# Patient Record
Sex: Female | Born: 1955 | Race: White | Hispanic: No | Smoking: Never smoker
Health system: Southern US, Community
[De-identification: ages and names within clinical notes are randomized; demographics above are authoritative.]

## PROBLEM LIST (undated history)

## (undated) DIAGNOSIS — K219 Gastro-esophageal reflux disease without esophagitis: Secondary | ICD-10-CM

## (undated) DIAGNOSIS — F419 Anxiety disorder, unspecified: Secondary | ICD-10-CM

## (undated) DIAGNOSIS — K635 Polyp of colon: Secondary | ICD-10-CM

## (undated) DIAGNOSIS — I82409 Acute embolism and thrombosis of unspecified deep veins of unspecified lower extremity: Secondary | ICD-10-CM

## (undated) DIAGNOSIS — M1990 Unspecified osteoarthritis, unspecified site: Secondary | ICD-10-CM

## (undated) DIAGNOSIS — I10 Essential (primary) hypertension: Secondary | ICD-10-CM

## (undated) DIAGNOSIS — N63 Unspecified lump in unspecified breast: Secondary | ICD-10-CM

## (undated) DIAGNOSIS — R61 Generalized hyperhidrosis: Secondary | ICD-10-CM

## (undated) DIAGNOSIS — Z972 Presence of dental prosthetic device (complete) (partial): Secondary | ICD-10-CM

## (undated) DIAGNOSIS — D649 Anemia, unspecified: Secondary | ICD-10-CM

## (undated) DIAGNOSIS — K08109 Complete loss of teeth, unspecified cause, unspecified class: Secondary | ICD-10-CM

## (undated) HISTORY — DX: Unspecified lump in unspecified breast: N63.0

## (undated) HISTORY — DX: Essential (primary) hypertension: I10

## (undated) HISTORY — PX: SHOULDER ARTHROSCOPY W/ ROTATOR CUFF REPAIR: SHX2400

## (undated) HISTORY — DX: Gastro-esophageal reflux disease without esophagitis: K21.9

## (undated) HISTORY — DX: Presence of dental prosthetic device (complete) (partial): Z97.2

## (undated) HISTORY — DX: Generalized hyperhidrosis: R61

## (undated) HISTORY — PX: CHOLECYSTECTOMY: SHX55

## (undated) HISTORY — DX: Polyp of colon: K63.5

## (undated) HISTORY — DX: Complete loss of teeth, unspecified cause, unspecified class: K08.109

---

## 1987-11-11 HISTORY — PX: ABDOMINAL HYSTERECTOMY: SHX81

## 1999-11-11 HISTORY — PX: CARDIAC CATHETERIZATION: SHX172

## 2001-06-22 ENCOUNTER — Inpatient Hospital Stay (HOSPITAL_COMMUNITY): Admission: AD | Admit: 2001-06-22 | Discharge: 2001-06-23 | Payer: Self-pay | Admitting: Cardiology

## 2002-07-21 ENCOUNTER — Encounter: Admission: RE | Admit: 2002-07-21 | Discharge: 2002-08-09 | Payer: Self-pay

## 2002-08-11 ENCOUNTER — Encounter: Admission: RE | Admit: 2002-08-11 | Discharge: 2002-11-09 | Payer: Self-pay

## 2002-10-03 ENCOUNTER — Encounter: Payer: Self-pay | Admitting: General Surgery

## 2002-10-03 ENCOUNTER — Encounter: Admission: RE | Admit: 2002-10-03 | Discharge: 2002-10-03 | Payer: Self-pay | Admitting: General Surgery

## 2002-11-17 ENCOUNTER — Encounter: Admission: RE | Admit: 2002-11-17 | Discharge: 2003-02-15 | Payer: Self-pay

## 2003-03-03 ENCOUNTER — Encounter: Admission: RE | Admit: 2003-03-03 | Discharge: 2003-06-01 | Payer: Self-pay | Admitting: Orthopedic Surgery

## 2003-03-28 ENCOUNTER — Encounter
Admission: RE | Admit: 2003-03-28 | Discharge: 2003-06-26 | Payer: Self-pay | Admitting: Physical Medicine & Rehabilitation

## 2003-05-27 ENCOUNTER — Inpatient Hospital Stay (HOSPITAL_COMMUNITY): Admission: AD | Admit: 2003-05-27 | Discharge: 2003-05-28 | Payer: Self-pay | Admitting: Cardiology

## 2003-05-28 ENCOUNTER — Encounter: Payer: Self-pay | Admitting: Cardiology

## 2003-11-20 ENCOUNTER — Encounter: Admission: RE | Admit: 2003-11-20 | Discharge: 2003-11-20 | Payer: Self-pay | Admitting: Obstetrics and Gynecology

## 2003-11-20 ENCOUNTER — Other Ambulatory Visit: Admission: RE | Admit: 2003-11-20 | Discharge: 2003-11-20 | Payer: Self-pay | Admitting: Obstetrics and Gynecology

## 2005-05-06 ENCOUNTER — Ambulatory Visit: Payer: Self-pay | Admitting: Internal Medicine

## 2005-05-12 ENCOUNTER — Ambulatory Visit (HOSPITAL_COMMUNITY): Admission: RE | Admit: 2005-05-12 | Discharge: 2005-05-12 | Payer: Self-pay | Admitting: Internal Medicine

## 2005-05-23 ENCOUNTER — Encounter: Admission: RE | Admit: 2005-05-23 | Discharge: 2005-05-23 | Payer: Self-pay | Admitting: Obstetrics and Gynecology

## 2005-05-29 ENCOUNTER — Ambulatory Visit (HOSPITAL_COMMUNITY): Admission: RE | Admit: 2005-05-29 | Discharge: 2005-05-29 | Payer: Self-pay | Admitting: Obstetrics and Gynecology

## 2005-06-16 ENCOUNTER — Ambulatory Visit (HOSPITAL_COMMUNITY): Admission: RE | Admit: 2005-06-16 | Discharge: 2005-06-16 | Payer: Self-pay | Admitting: Internal Medicine

## 2005-06-16 ENCOUNTER — Ambulatory Visit: Payer: Self-pay | Admitting: Internal Medicine

## 2005-07-01 ENCOUNTER — Ambulatory Visit (HOSPITAL_COMMUNITY): Admission: RE | Admit: 2005-07-01 | Discharge: 2005-07-01 | Payer: Self-pay | Admitting: Obstetrics and Gynecology

## 2005-07-03 ENCOUNTER — Ambulatory Visit: Payer: Self-pay | Admitting: Internal Medicine

## 2005-07-15 ENCOUNTER — Ambulatory Visit: Payer: Self-pay | Admitting: Cardiology

## 2005-07-22 ENCOUNTER — Ambulatory Visit: Payer: Self-pay | Admitting: Internal Medicine

## 2005-08-06 ENCOUNTER — Encounter (INDEPENDENT_AMBULATORY_CARE_PROVIDER_SITE_OTHER): Payer: Self-pay | Admitting: Internal Medicine

## 2005-08-06 ENCOUNTER — Ambulatory Visit: Payer: Self-pay | Admitting: Internal Medicine

## 2005-08-06 ENCOUNTER — Ambulatory Visit (HOSPITAL_COMMUNITY): Admission: RE | Admit: 2005-08-06 | Discharge: 2005-08-06 | Payer: Self-pay | Admitting: Internal Medicine

## 2005-08-09 ENCOUNTER — Emergency Department (HOSPITAL_COMMUNITY): Admission: EM | Admit: 2005-08-09 | Discharge: 2005-08-09 | Payer: Self-pay | Admitting: Emergency Medicine

## 2005-11-17 ENCOUNTER — Ambulatory Visit (HOSPITAL_COMMUNITY): Admission: RE | Admit: 2005-11-17 | Discharge: 2005-11-17 | Payer: Self-pay | Admitting: Internal Medicine

## 2006-02-25 ENCOUNTER — Ambulatory Visit (HOSPITAL_COMMUNITY): Admission: RE | Admit: 2006-02-25 | Discharge: 2006-02-25 | Payer: Self-pay | Admitting: Internal Medicine

## 2007-12-03 ENCOUNTER — Ambulatory Visit: Payer: Self-pay | Admitting: Cardiology

## 2008-07-03 ENCOUNTER — Ambulatory Visit: Payer: Self-pay | Admitting: Cardiology

## 2008-12-13 ENCOUNTER — Encounter: Admission: RE | Admit: 2008-12-13 | Discharge: 2008-12-13 | Payer: Self-pay | Admitting: *Deleted

## 2009-06-04 ENCOUNTER — Encounter: Admission: RE | Admit: 2009-06-04 | Discharge: 2009-09-02 | Payer: Self-pay | Admitting: Surgery

## 2009-06-18 ENCOUNTER — Ambulatory Visit (HOSPITAL_COMMUNITY): Admission: RE | Admit: 2009-06-18 | Discharge: 2009-06-18 | Payer: Self-pay | Admitting: Surgery

## 2010-01-02 ENCOUNTER — Ambulatory Visit: Payer: Self-pay | Admitting: Cardiology

## 2010-07-23 ENCOUNTER — Ambulatory Visit (HOSPITAL_COMMUNITY)
Admission: RE | Admit: 2010-07-23 | Discharge: 2010-07-23 | Payer: Self-pay | Source: Home / Self Care | Admitting: Surgery

## 2010-08-19 ENCOUNTER — Inpatient Hospital Stay (HOSPITAL_COMMUNITY): Admission: RE | Admit: 2010-08-19 | Discharge: 2010-08-22 | Payer: Self-pay | Admitting: Surgery

## 2010-08-19 HISTORY — PX: GASTRIC BYPASS OPEN: SUR638

## 2010-08-20 ENCOUNTER — Encounter (INDEPENDENT_AMBULATORY_CARE_PROVIDER_SITE_OTHER): Payer: Self-pay | Admitting: Surgery

## 2010-08-20 ENCOUNTER — Ambulatory Visit: Payer: Self-pay | Admitting: Vascular Surgery

## 2010-08-27 ENCOUNTER — Encounter
Admission: RE | Admit: 2010-08-27 | Discharge: 2010-08-27 | Payer: Self-pay | Source: Home / Self Care | Attending: Surgery | Admitting: Surgery

## 2010-10-08 ENCOUNTER — Encounter
Admission: RE | Admit: 2010-10-08 | Discharge: 2010-12-10 | Payer: Self-pay | Source: Home / Self Care | Attending: Surgery | Admitting: Surgery

## 2011-01-23 LAB — DIFFERENTIAL
Basophils Relative: 1 % (ref 0–1)
Eosinophils Absolute: 0 10*3/uL (ref 0.0–0.7)
Eosinophils Absolute: 0.2 10*3/uL (ref 0.0–0.7)
Eosinophils Relative: 0 % (ref 0–5)
Eosinophils Relative: 2 % (ref 0–5)
Lymphocytes Relative: 12 % (ref 12–46)
Lymphocytes Relative: 33 % (ref 12–46)
Lymphs Abs: 1.5 10*3/uL (ref 0.7–4.0)
Lymphs Abs: 2.3 10*3/uL (ref 0.7–4.0)
Monocytes Absolute: 0.4 10*3/uL (ref 0.1–1.0)
Monocytes Relative: 5 % (ref 3–12)
Neutro Abs: 6.6 10*3/uL (ref 1.7–7.7)
Neutro Abs: 4.1 10*3/uL (ref 1.7–7.7)
Neutrophils Relative %: 75 % (ref 43–77)
Neutrophils Relative %: 84 % — ABNORMAL HIGH (ref 43–77)
Neutrophils Relative %: 63 % (ref 43–77)
Neutrophils Relative %: 59 % (ref 43–77)

## 2011-01-23 LAB — CBC
HCT: 37.5 % (ref 36.0–46.0)
Hemoglobin: 12.1 g/dL (ref 12.0–15.0)
Hemoglobin: 13.8 g/dL (ref 12.0–15.0)
MCHC: 33.5 g/dL (ref 30.0–36.0)
MCHC: 33.3 g/dL (ref 30.0–36.0)
MCV: 79.5 fL (ref 78.0–100.0)
MCV: 80 fL (ref 78.0–100.0)
MCV: 79.7 fL (ref 78.0–100.0)
MCV: 79.7 fL (ref 78.0–100.0)
Platelets: 155 10*3/uL (ref 150–400)
Platelets: 178 10*3/uL (ref 150–400)
Platelets: 195 10*3/uL (ref 150–400)
RBC: 4.47 MIL/uL (ref 3.87–5.11)
RBC: 5.2 MIL/uL — ABNORMAL HIGH (ref 3.87–5.11)
RBC: 4.71 MIL/uL (ref 3.87–5.11)
RDW: 15.6 % — ABNORMAL HIGH (ref 11.5–15.5)
RDW: 15.7 % — ABNORMAL HIGH (ref 11.5–15.5)
RDW: 16.6 % — ABNORMAL HIGH (ref 11.5–15.5)
RDW: 16.3 % — ABNORMAL HIGH (ref 11.5–15.5)
WBC: 12.8 10*3/uL — ABNORMAL HIGH (ref 4.0–10.5)
WBC: 8.8 10*3/uL (ref 4.0–10.5)
WBC: 7 10*3/uL (ref 4.0–10.5)

## 2011-01-23 LAB — HEMOGLOBIN AND HEMATOCRIT, BLOOD
HCT: 37.6 % (ref 36.0–46.0)
Hemoglobin: 12.6 g/dL (ref 12.0–15.0)

## 2011-01-23 LAB — COMPREHENSIVE METABOLIC PANEL
ALT: 27 U/L (ref 0–35)
ALT: 30 U/L (ref 0–35)
AST: 27 U/L (ref 0–37)
Albumin: 3.8 g/dL (ref 3.5–5.2)
Alkaline Phosphatase: 67 U/L (ref 39–117)
Calcium: 9.4 mg/dL (ref 8.4–10.5)
Chloride: 103 mEq/L (ref 96–112)
GFR calc Af Amer: 52 mL/min — ABNORMAL LOW (ref >60)
GFR calc Af Amer: 60 mL/min — ABNORMAL LOW (ref >60)
GFR calc non Af Amer: 43 mL/min — ABNORMAL LOW (ref >60)
Glucose, Bld: 89 mg/dL (ref 70–99)
Sodium: 142 mEq/L (ref 135–145)
Total Bilirubin: 0.5 mg/dL (ref 0.3–1.2)
Total Bilirubin: 0.8 mg/dL (ref 0.3–1.2)
Total Protein: 7.7 g/dL (ref 6.0–8.3)

## 2011-01-23 LAB — MRSA PCR SCREENING: MRSA by PCR: NEGATIVE

## 2011-01-23 LAB — SURGICAL PCR SCREEN: MRSA, PCR: NEGATIVE

## 2011-02-12 ENCOUNTER — Encounter: Payer: PRIVATE HEALTH INSURANCE | Attending: Surgery | Admitting: *Deleted

## 2011-02-12 ENCOUNTER — Encounter: Admit: 2011-02-12 | Payer: Self-pay | Admitting: Surgery

## 2011-02-12 DIAGNOSIS — Z09 Encounter for follow-up examination after completed treatment for conditions other than malignant neoplasm: Secondary | ICD-10-CM | POA: Insufficient documentation

## 2011-02-12 DIAGNOSIS — Z713 Dietary counseling and surveillance: Secondary | ICD-10-CM | POA: Insufficient documentation

## 2011-02-12 DIAGNOSIS — Z9884 Bariatric surgery status: Secondary | ICD-10-CM | POA: Insufficient documentation

## 2011-03-17 ENCOUNTER — Other Ambulatory Visit: Payer: Self-pay | Admitting: Obstetrics & Gynecology

## 2011-03-17 DIAGNOSIS — Z780 Asymptomatic menopausal state: Secondary | ICD-10-CM

## 2011-03-17 DIAGNOSIS — Z1231 Encounter for screening mammogram for malignant neoplasm of breast: Secondary | ICD-10-CM

## 2011-03-28 ENCOUNTER — Ambulatory Visit: Payer: PRIVATE HEALTH INSURANCE

## 2011-03-28 ENCOUNTER — Other Ambulatory Visit: Payer: PRIVATE HEALTH INSURANCE

## 2011-03-28 NOTE — Cardiovascular Report (Signed)
[place]. [facility]  Patient:    LYNNSEY, BARBARA                      MRN: [id number] Proc. Date: 06/23/01 Adm. Date:  [id number] Disc. Date: [id number] Attending:  Rollene Rotunda CC:         Fara Chute, M.D.  Luis Abed, M.D. [facility]  Cath Lab   Cardiac Catheterization  PROCEDURE:  Left heart catheterization with coronary angiography and left ventriculography.  INDICATIONS:  Ms. Pugmire is a 55 year old woman who has had recurrent hospital admissions for chest pain.  She was referred to cardiac catheterization to rule out coronary artery disease.  DESCRIPTION OF PROCEDURE:  A 6 French sheath was placed in the right femoral artery.  Standard Judkins 6 French catheters were utilized.  Contrast was Omnipaque.  At the conclusion of the procedure a Perclose vascular closure device was placed in the right femoral artery with good hemostasis.  There were no complications.  RESULTS:  HEMODYNAMICS:  Left ventricular pressure 144/22, aortic pressure 144/78. There was no aortic valve gradient.  LEFT VENTRICULOGRAM:  Wall motion is normal.  Ejection fraction is estimated at 60%.  There is no mitral regurgitation.  CORONARY ARTERIOGRAPHY:  (Codominant).  Left main is normal.  Left anterior descending artery gives rise to a single normal size diagonal branch.  LAD is free of angiographic disease.  Left circumflex is a codominant vessel.  It gives rise to a large branching obtuse marginal and a normal size posterolateral branch.  The left circumflex is free of angiographic disease.  Right coronary artery is a codominant vessel.  It ends as a normal size posterior descending artery.  The right coronary artery is free of angiographic disease.  IMPRESSION: 1. Normal left ventricular systolic function. 2. Normal coronary arteries. DD:  06/23/01 TD:  06/23/01 Job: [id number]

## 2011-03-28 NOTE — Consult Note (Signed)
NAME:  [name], [name]                           ACCOUNT NO.:  [id number]   MEDICAL RECORD NO.:  [id number]                   PATIENT TYPE:  REC   LOCATION:  TPC                                  FACILITY:  [facility]   PHYSICIAN:  Zachary George, DO                      DATE OF BIRTH:  Apr 06, 1956   DATE OF CONSULTATION:  12/15/2002  DATE OF DISCHARGE:                                   CONSULTATION   The patient returns to clinic today for reevaluation.  She was last seen on  12/30/2002.  She has undergone trigger point injections to her left  prescapular muscles on three occasions with progressive improvement in her  pain as well as range of motion of her left shoulder.  At last visit,she had  trigger point injections into the levator scapuli and upper trapezius  muscles with about three to four days of discomfort following the injection,  but then she noted significant improvement.  Her pain today is a 6/10 on a  subjective scale, down from 9/10 at last visit.  She continues on Norco 10  mg/325 mg 3 times daily, and we discussed eventually decreasing this  medication.  She feels like it is helping her functional abilities which  have improved overall.  She states it allows her to use her left upper  extremity more frequently.  She continues a home exercise program with upper  extremity ergometry and pulley exercises.  I reviewed the health and history form and 14-point Review of Systems.  The  patient denies any radicular complaints.   PHYSICAL EXAMINATION:  GENERAL:  Obese female in no acute distress.  VITAL SIGNS:  Blood pressure 163/96, pulse 69, respirations 16, O2  saturation 100% on room air.  MUSCULOSKELETAL:  Range of motion of the left shoulder is improved to 130  degrees of flexion and abduction.  NEUROLOGIC:  Without change in upper extremities including motor, sensory,  and reflexes.  Palpatory examination reveals trigger points in the left  upper trapezius and levator scapuli  which are less painful today but still  fairly tender.  Posturally, the patient has anterior head thrust with  dowager's hump.   IMPRESSION:  1. Myofascial pain syndrome.  2. Left rotator cuff syndrome.  3. Adhesive capsulitis, left shoulder, with improved range of motion.   PLAN:  1. Repeat trigger point injections to left upper trapezius and levator     scapuli muscles.  2. Continue home exercise program.  3. Continue Norco 10 mg/325 mg 1 p.o. b.i.d. to t.i.d. as needed, #90     without refill.  The patient has been taking this appropriately.  I think     it is helping her maintain her functional level, but again we discussed     decreasing this medication over time which she understands.  4. Continue Lidoderm patches.  5. Continue Nortriptyline  30 mg at bedtime.  6. The patient is to return to clinic in one month for reevaluation.   PROCEDURE:  Trigger point injections left upper trapezius x 2 sites and  levator scapuli muscles.   DESCRIPTION OF PROCEDURE:  The procedure was described to the patient in  detail including risks, benefits, limitations, alternatives, and potential  side effects.  Risks include but are not limited to bleeding, infection,  nerve injury, pneumothorax, increased pain, failure to relieve pain,  allergic reactions to medications.  The patient understands and wishes to  proceed.  Informed consent was obtained.   Skin was prepped in usual fashion with alcohol swabs.  Each trigger point  was injected with 0.5 ml of 1% lidocaine plus 0.5 ml of 0.25% bupivacaine  using a 25-gauge 1-1/2 inch needle.  The patient tolerated the procedure  well.  There were no complications.  Discharge instructions were given.  The  patient was monitored and released in stable condition.   The patient was educated about findings and recommendations and understands.  No barriers to communication.                                               Zachary George, DO    JW/MEDQ  D:   01/13/2003  T:  01/13/2003  Job:  [id number]   cc:   [name], M.D.  [address]  [place]  [place]  [address]  Fax: [phone]

## 2011-03-28 NOTE — Consult Note (Signed)
NAMELANIECE, HORNBAKER                         ACCOUNT NO.:  [id number]   MEDICAL RECORD NO.:  [id number]                   PATIENT TYPE:  REC   LOCATION:  TPC                                  FACILITY:  [facility]   PHYSICIAN:  Sondra Come, D.O.                 DATE OF BIRTH:  12/16/1954   DATE OF CONSULTATION:  07/25/2002  DATE OF DISCHARGE:                                   CONSULTATION   Dear Dr. Ranell Patrick:   Thank you very much for kindly referring [name].  [name] was evaluated in our clinic today.  Please refer to the following for details regarding the history, physical  examination, and treatment plan.  Once again, thank you for allowing [place] to  participate in the care of [name].   CHIEF COMPLAINT:  Left shoulder and arm pain.   HISTORY OF PRESENT ILLNESS:  [name] is a pleasant 55 year old right-hand  dominant female who complains of chronic left shoulder pain status post left  shoulder arthroscopy x2 in 2000 with rotator cuff repair and what sounds  like scar tissue resection.  The patient complains of decreased range of  motion and pain pointing across the top of her left shoulder and upper  trapezius with associated numbness and paresthesias in her left upper  extremity which are intermittent.  She was previously followed by Dr. Mila Palmer  in [place], [place], until he moved to [place], [place].  Apparently he was recommending a repeat surgery, however, her insurance  would not pay for services rendered in [place] so she was referred to  [facility] where she saw Dr. Ranell Patrick.  Dr. Ranell Patrick did not  recommend any surgical intervention at this time.  He got her involved in  physical therapy which she states offered no relief.  She has previously had  various steroid injections into her left shoulder without any relief.  Her  last steroid injection was in June of 2003.  She has primarily been  treated  with medications including hydrocodone on-and-off for several months at a  time.  Most recently, she received a prescription for hydrocodone from Dr.  Ranell Patrick and has been taking up to six to eight hydrocodone 5 mg pills per  day.  She has also been treated with nonsteroidal anti-inflammatory  medications in the past including COX-2 inhibitors which all burn my  stomach.  She has also been worked up with electrodiagnostic studies in  June of 2003 which are normal per her report.  She also had an MRI of her  shoulders.  I do not have copies of these reports nor do I have the films to  review.  Her pain today is an 8/10 on a subjective scale described as  throbbing, burning, and tingling.  Her function and quality of life indices  have declined, and her sleep  is poor.  Her symptoms are worse with any  activity with the left upper extremity, working and therapy, and improved  with medications and heat to some degree.  I reviewed health and history  form, and 14-point review of systems.   PAST MEDICAL HISTORY:  Depression, gastroesophageal reflux disease, and  hypertension.   PAST SURGICAL HISTORY:  Hysterectomy, coronary catheterization, and left  shoulder surgery x2.   SOCIAL HISTORY:  The patient denies smoking, alcohol, or drug use.  She is  married and is not currently working.   ALLERGIES:  No known drug allergies.  She has nausea with MORPHINE and is  unable to tolerate NONSTEROIDAL ANTI-INFLAMMATORY MEDICATIONS secondary to  gastroesophageal reflux.   CURRENT MEDICATIONS:  1. Norvasc.  2. Paxil.  3. BuSpar.  4. Aspirin daily.  5. Omeprazole.  6. Chlorthalidone.  7. Alprazolam 0.5 mg one to three times per day.  8. Vicodin 5 mg/500 mg as needed.   PHYSICAL EXAMINATION:  GENERAL:  The patient is a healthy female in no acute  distress.  VITAL SIGNS:  Blood pressure 146/57, pulse 83, respirations 16, O2  saturation is 97% on room air, height 5 feet 5 inches, weight  220 pounds.  BACK:  Exam does not reveal any asymmetries with the exception of slightly  lower left shoulder height.  UPPER EXTREMITIES:  Range of motion of the shoulders is full on the right  actively and passively. Range of motion of the shoulder on the left reveals  abduction and flexion limited to approximately 90 degrees actively as well  as passively with guarding.  There is significant tenderness to palpation in  the left upper trapezius and parascapular muscles.  No atrophy noted in the  upper extremities bilaterally.  NEUROLOGIC:  Manual muscle testing is 5/5 bilateral upper extremities.  Sensory examination is intact to light touch bilateral upper extremities.  Muscle stretch reflexes are 2+/4 bilateral biceps, triceps, brachioradialis,  pronator teres.  Provocative maneuvers of the left shoulder include a  positive Hawkins, Neer, and empty-can test.  Alois Cliche is difficult to  determine secondary to cooperation.  Spurling maneuver is negative  bilaterally.   IMPRESSION:  1. Rotator cuff syndrome with chronic left shoulder pain.  2. Probable adhesive capsulitis.   PLAN:  1. I had a long discussion with [name] regarding treatment options.  This     was an extensive consultation of 45 minutes duration.  We discussed     medications at length.  I would like to start her off with Lidoderm 5%     patches applied 12 hours per day, maximum three patches per day, #30,     with one refill.  I instructed the patient that we need to give this at     least 14 days before determining its efficacy.  2. Will treat her with Ultracet one to two p.o. q.i.d. as needed, #60 with     one refill.  3. If symptoms are not improving, would consider switching Ultracet to Norco     10 mg/325 mg; however, I would like to avoid further narcotic exposure if     possible.  4. Once the patient's pain symptoms are fairly well controlled, will discuss    with her getting back into a physical therapy  program and possibly even     aquatic therapy for functional restoration as it does not seem that she     tolerated physical therapy very well and did not actually get to  the     functional restoration phase.  5. The patient is to return to clinic in one month for re-evaluation or     sooner as needed.   The patient was educated on the above findings and recommendations, and  understands.  There were no barriers to communication.                                               Sondra Come, D.O.    JJW/MEDQ  D:  07/25/2002  T:  07/25/2002  Job:  [id number]   cc:   [name], M.D.  [address]  [place]  [place]  [address]  Fax: [phone]

## 2011-03-28 NOTE — H&P (Signed)
NAME:  [name], [name]                           ACCOUNT NO.:  [id number]   MEDICAL RECORD NO.:  [id number]                   PATIENT TYPE:  INP   LOCATION:  2029                                 FACILITY:  [facility]   PHYSICIAN:  Jonelle Sidle, M.D. [facility]        DATE OF BIRTH:  1956/09/08   DATE OF ADMISSION:  05/27/2003  DATE OF DISCHARGE:                                HISTORY & PHYSICAL   PRIMARY CARE PHYSICIAN:  Fara Chute, M.D.   CARDIOLOGIST:  Jonelle Sidle, M.D.   CHIEF COMPLAINT:  Chest tightness and dyspnea.   HISTORY OF PRESENT ILLNESS:  [name] is a 55 year old woman with morbid  obesity, hypertension, and depression, who underwent cardiac catheterization  back in August of 2002 following presentation with chest discomfort similar  to the above reported symptoms.  This procedure was performed by Dr.  Antoine Poche and showed angiographically normal coronary arteries with normal  left ventricular ejection fraction.  She has not been seen in the office  since that time.  She presents now with a two-week history of recurrent  chest tightness and dyspnea without any specific trigger or clear  alleviating factors.  She was at the mall today with her husband and  developed recurrent pain around 10 a.m. and presented to the [facility] Emergency Department for further assessment.  At that facility, she  was noted to have an elevated CK level of 496 with an MB fraction of 19.6,  representing a relative index of 3.9%.  However, this was associated with a  normal troponin I level of 0.02.  A 12-lead electrocardiogram was  nonspecific.  Further history suggests that she had a fall a few weeks ago,  apparently injuring her left leg.  She had reported Doppler studies  performed, which based on outside records in the emergency department from  [facility] were normal.  She states that she had some  bruising in her left calf and left hip area, but that these  have improved.  She has not been coughing or had any history of hemoptysis or fever  recently.  She has had no asymmetric swelling of her lower extremities.  At  this point, her chest discomfort has resolved.   ALLERGIES:  1. MORPHINE.  2. PHENERGAN.   CURRENT MEDICATIONS:  1. Norvasc 10 mg p.o. daily.  2. BuSpar 20 mg p.o. b.i.d.  3. Paxil 25 mg p.o. daily.  4. Xanax 1 mg p.o. t.i.d.  5. Aspirin 325 mg p.o. daily.  6. Lexapro 10 mg p.o. daily (started today).  7. Hydrochlorothiazide 25 mg p.o. daily.   PAST MEDICAL HISTORY:  1. Angiographically normal coronary arteries by catheterization in August of     2002 at this facility.  2. Depression.  3. Status post hysterectomy.  4. Status post shoulder surgery.  5. Hypertension.   SOCIAL HISTORY:  The patient denies tobacco and alcohol use.  FAMILY HISTORY:  Noncontributory for premature cardiovascular disease.   REVIEW OF SYSTEMS:  As found in the history of present illness.   PHYSICAL EXAMINATION:  VITAL SIGNS:  The blood pressure is 130/65, heart  rate 71, respirations 15, temperature 97.0 degrees, and oxygen saturation  99% on 2 L nasal cannula.  GENERAL APPEARANCE:  This is a morbidly obese woman lying supine in no acute  distress.  HEENT:  Conjunctivae and lids normal.  Oropharynx clear.  NECK:  Supple without elevated jugular venous pressure.  LUNGS:  Decreased breath sounds, but no rales or wheezing.  The respiratory  rate is nonlabored at rest.  CARDIAC:  Regular rate and rhythm without S3 gallop.  There is an S4,  possibly a soft rub, although it may be a soft early systolic murmur.  The  heart sounds are difficult to fully appreciate.  ABDOMEN:  Morbidly obese with no bruits.  EXTREMITIES:  No pitting edema.  Cords are asymmetric edema.  No bruising is  noted at this time.  Pulses are 2+.  NEUROPSYCHIATRIC:  The patient is alert and oriented x 3.   LABORATORY DATA:  The chest x-ray from [facility] was  reported as  showing cardiomegaly with no acute disease process.   The 12-lead electrocardiogram shows normal sinus rhythm with a leftward  axis, but nonspecific T-wave changes.   WBC 8.9, hemoglobin 13, platelets 197.  Sodium 138, potassium 3.9, BUN 12,  creatinine 0.9, glucose 105.  The BNP is 88.  The ABG on room air showed a  pH of 7.5, a pCO2 of 30, a pO2 of 98, and a bicarbonate of 23.   IMPRESSION:  1. Recurrent chest pain in a 55 year old woman with angiographically normal     coronary arteries in August of 2002.  Initial CK levels are elevated as     described with a normal troponin I and electrocardiogram is nonspecific.     She does report a recent fall with bruising of her left lower extremity,     but apparently normal Dopplers at [facility].  I do not have the specifics     regarding.  She is not hypoxic nor tachycardic at this time.  2. Morbid obesity.  3. Hypertension.  4. Depression.  5. Apparent history of myofascial pain syndrome based on chart review.   PLAN:  1. Will admit and cycle cardiac markers.  2. Continue Lovenox and home medications.  3. Will check a D-dimer level.  4. If cardiac markers are unrevealing, will proceed with an adenosine     Cardiolite.  If the trend in CK levels remains, may need to consider     repeat angiography.                                              Jonelle Sidle, M.D. [facility]   SGM/MEDQ  D:  05/27/2003  T:  05/27/2003  Job:  [id number]

## 2011-03-28 NOTE — Consult Note (Signed)
NAME:  [name], [name]                           ACCOUNT NO.:  [id number]   MEDICAL RECORD NO.:  [id number]                   PATIENT TYPE:  REC   LOCATION:  TPC                                  FACILITY:  [facility]   PHYSICIAN:  Zachary George, DO                      DATE OF BIRTH:  [birth year]   DATE OF CONSULTATION:  12/12/2002  DATE OF DISCHARGE:                                   CONSULTATION   The patient returns to clinic today for reevaluation.  She was last seen on  11/22/2002.  At that time, she underwent trigger point injections to the left  upper trapezius and levator scapulae muscles with significant improvement in  her left upper back pain for about three or four days.  During that time,  her functional indices improved as well as range of motion of her left upper  extremity.  She also noticed that she was able to decrease the amount of  hydrocodone she required during the day for her pain. She continues with  physical therapy and is advancing the program, stating that she is doing  more reaching exercises as well as upper extremity cycling, rotator cuff  strengthening, and pegboard activities.  She does describe increased pain,  however, secondary to falling three days ago onto her left wrist and states  that she was evaluated in the emergency department and was told she had  sprained her wrist.  She rates her pain as a 10/10n on a subjective scale  today.  I reviewed health and history form and 14-point Review of Systems.   PHYSICAL EXAMINATION:  GENERAL:  Obese female in no acute distress.  VITAL SIGNS:  Blood pressure 144/103, pulse 77, respirations 20, O2  saturation 98% on room air.  MUSCULOSKELETAL:  Range of motion of the left shoulder reveals 90 degrees of  abduction and flexion actively.  Passive range of motion is the same.  There  are trigger points noted in the left upper trapezius and levator scapulae  muscles to palpation.  NEUROLOGIC:  Examination is intact in the  left upper extremity.  The right  upper extremity not tested secondary to arm being in a splint.   IMPRESSION:  1. Myofascial pain syndrome.  2. Left rotator cuff syndrome.  3. Adhesive capsulitis, left shoulder.   PLAN:  1. Repeat 5rigger point injections, left upper trapezius and levator     scapulae muscles.  2. Continue physical therapy.  3. Continue Norco 10 mg/325 mg 1 p.o. t.i.d. as needed, #90 without refill.     We will continue to monitor medication usage.  The patient has been     taking this appropriately.  4. Continue Lidoderm patches.  Apply up to 12 hours per day, maximum of 3     patches at a time, #30 without refills.  5. Continue Pamelor 30 mg at  bedtime.  6. The patient is to return to clinic in two weeks for re-evaluation and     repeat trigger injections as predicated upon symptoms and response.   PROCEDURE:  Trigger point injection to left upper trapezius and levator  scapulae muscles.   DESCRIPTION OF PROCEDURE:  The procedure was described to the patient in  detail including risks, benefits, limitations, and alternatives.  Risks  include bleeding, infection, nerve injury, pneumothorax, increased pain,  failure to relieve pain.  The patient wishes to proceed.  Informed consent  was obtained.   The patient's skin was prepped in the usual fashion with alcohol swabs.  Each trigger point was injected with 0.5 cc of 1% lidocaine pulse 0.5 cc of  0.25% bupivacaine using a 25-gauge 1-1/2 inch needle.  Twitch responses were  again noted.  The patient tolerated the procedure well.  There were no  complications.  Discharge instructions were given.   The patient was educated about findings and recommendations and understands.  No barriers to communication.  The patient was released in stable condition                                                 Zachary George, DO    JW/MEDQ  D:  12/12/2002  T:  12/12/2002  Job:  [id number]   cc:   [name], M.D.  [address]  [place]  [place]  [address]  Fax: [phone]

## 2011-03-28 NOTE — Consult Note (Signed)
NAMEGAVRIELLA, [name]                         ACCOUNT NO.:  [id number]   MEDICAL RECORD NO.:  [id number]                   PATIENT TYPE:  REC   LOCATION:  TPC                                  FACILITY:  [facility]   PHYSICIAN:  Sondra Come, D.O.                 DATE OF BIRTH:  12/16/1954   DATE OF CONSULTATION:  08/12/2002  DATE OF DISCHARGE:                                   CONSULTATION   REASON FOR CONSULTATION:  The patient returns to clinic today as scheduled  for re-evaluation.  She was initially seen on 07/25/02.  In the interim, she  states she was unable to tolerate the Ultracet.  I had called in some  hydrocodone for her on 07/27/02.  She did not bring her pills today for pill  count, however, she should have approximately six days left on her current  prescription.  She states that overall her left shoulder pain seems to be  improved, not only with the hydrocodone, but also with the Lidoderm patch.  We also discussed last time about getting her into an aquatic therapy  program, as she was unable to tolerate land based therapy, and she wishes to  proceed with this.  I reviewed health and history form and 14 point review  of systems.  The patient's pain is a 6/10 on a subjective scale today.  Function and quality of life indices remain about the same, despite the  current medications.  I was hoping to have her a little bit more functional  with the medications, and we will continue to monitor this.  If her function  does not improve with medications, we will consider weaning her from the  hydrocodone.  She admits that she does not sleep very well at night.  I  discussed sleep medications with her.   PHYSICAL EXAMINATION:  GENERAL:  A healthy-appearing female in no acute  distress.  VITAL SIGNS:  Blood pressure 130/83, pulse 68, respirations 18, O2  saturation 98% on room air.  EXTREMITIES:  Examination of the upper extremities that not reveal any heat,  erythema, or edema.  NEUROLOGIC:  Intact in the upper extremities, including motor, sensory, and  reflexes at this time.  Provocative maneuvers of the left shoulder reveal a  positive Hawkins, Neer, and empty can test.  Alois Cliche is negative.   IMPRESSION:  Rotator cuff syndrome with chronic left shoulder pain and  adhesive capsulitis.   PLAN:  1. At this point, it is reasonable to get the patient involved in an aquatic     therapy program for range of motion, stretching, strengthening, scapular     stabilization exercises, leading to an independent program two to three     times per week.  Would like to advance to a land based program and     functional restoration.  We will have the therapist also use modalities  as needed once she is on land, such as ultrasound.  2. Continue with Lidoderm patches.  3. Continue Norco 10 mg/325 mg one p.o. t.i.d. p.r.n. #90 without refills.     This is not to be filled until 08/16/02 or after.  4. We will begin Pamelor 10 mg one p.o. q.h.s. #30 with one refill to     address sleep restoration as well as myofascial component.  5. The patient is to return to clinic in one month for re-evaluation.   The patient was educated on the above findings and recommendations and  understands.  There were no barriers to communication.                                                Sondra Come, D.O.    JJW/MEDQ  D:  08/12/2002  T:  08/12/2002  Job:  [id number]   cc:   [name], M.D.  [address]  [place]  [place]  [address]  Fax: [phone]

## 2011-03-28 NOTE — Consult Note (Signed)
NAME:  [name], [name]                           ACCOUNT NO.:  [id number]   MEDICAL RECORD NO.:  [id number]                   PATIENT TYPE:  REC   LOCATION:  TPC                                  FACILITY:  [facility]   PHYSICIAN:  Zachary George, DO                      DATE OF BIRTH:  1956-05-24   DATE OF CONSULTATION:  02/10/2003  DATE OF DISCHARGE:                                   CONSULTATION   HISTORY:  The patient returns to the clinic today for a re-evaluation.  She  was last seen on 01/13/03.  She states today that her pain seems to be  improved.  Her depression is better.  Her functional quality of life indices  have improved.  She still has decreased range of motion with her left  shoulder but overall this has improved since the first visit.  She has not  been performing her home exercise program consistently secondary to a family  member being in the hospital.  She describes her pain as moderate today.  She continues on Norco 10 mg/325mg  three times per day as well as Lidoderm  patches.  She has been talking Pamelor 30 mg at bedtime with improved sleep.  I reviewed Health and History form of 14 point review of systems.   PHYSICAL EXAMINATION:  GENERAL:  Obese female in no acute distress.  VITAL SIGNS:  Blood pressure is 138/66, pulse 83, respirations 18, O2  saturation 96% on room air.   There is really no significant change in examination today.  The patient can  still abduct and flex her left shoulder to approximately 130 degrees.  There  is minimal tenderness to palpation in the parascapular muscles.  This is  improved following trigger point injections at last visit.   IMPRESSION:  1. Left rotator cuff syndrome.  2. Adhesive capsulitis, left shoulder, with stable range of motion.  3. Myofascial pain syndrome, improved.   PLAN:  1. Continue home exercise program.  2. Will start weaning hydrocodone.  We will change Norco 10 mg to Lortab 7.5     mg/500 one p.o. t.i.d. as  needed #90 without refills.  Would consider     decreasing further in 1 month to 5 mg and then eventually discontinuing     the hydrocodone.  3. Continue Lidoderm patches #60 with 2 refills, to be used up to two hours     per day, maximum 3 patches at a time.  4.     Continue Pamelor 10 mg 3 p.o. q.h.s. #90 with 2 refills.  5. The patient to return to clinic in 1 month for re-evaluation.   The patient was educated about findings and recommendations and understands.  No barriers to communication.  Zachary George, DO    JW/MEDQ  D:  02/10/2003  T:  02/12/2003  Job:  [id number]   cc:   [name], M.D.  [address]  [place]  [place]  [address]  Fax: [phone]

## 2011-03-28 NOTE — Consult Note (Signed)
[name], [name]                         ACCOUNT NO.:  [id number]   MEDICAL RECORD NO.:  [id number]                   PATIENT TYPE:  REC   LOCATION:  [facility]                                  FACILITY:  [facility]   PHYSICIAN:  Sondra Come, D.O.                 DATE OF BIRTH:  12/16/1954   DATE OF CONSULTATION:  09/15/2002  DATE OF DISCHARGE:                                   CONSULTATION   REASON FOR CONSULTATION:  The patient returns to clinic today for re-  evaluation.  She was last seen on 08/12/02, and continues to complain of left  shoulder pain which she feels is improved with the medications which include  Lidoderm patch and hydrocodone 10 mg/325 mg t.i.d.  She has not gotten back  into physical therapy yet because she owed some money and has paid off her  bill, and will start physical therapy for aquatics next week.  Her function  and quality of life indices have remained declined, and this is in some part  related to increased stress and anxiety after finding a lump in her left  breast, and she waits for a biopsy.  She does relate that her mother had  breast cancer.  Her sleep remains poor despite Pamelor 10 mg at bedtime, and  we discussed titrating the medication to effect.  Her pain currently is a  4/10 on a subjective scale, and she describes it as moderate.  She describes  a burning type sensation over her left shoulder.  Historically, she had no  response from stellate ganglion blocks or subacromial steroid injections.  We discussed possibly performing suprascapular nerve blocks to assist with  her pain control.  I reviewed health and history form and 14 point review of  systems.   PHYSICAL EXAMINATION:  GENERAL:  An obese female who is somewhat tearful.  Mood is depressed, affect is flat.  VITAL SIGNS:  Blood pressure is 190/84, pulse 117, respirations 20, O2  saturation is 97% on room air.  EXTREMITIES:  The patient continues to have anterior shoulder slumped  posture.  Range of motion of the shoulders actively reveals abduction and  flexion of the left shoulder only to 100 degrees with discomfort.  Impingement signs are positive.  Neurologically intact in the upper  extremities bilaterally to include motor, sensory, and reflexes.  Palpatory  examination reveals significant tenderness to palpation in the left upper  trapezius as well as other parascapular muscles.   IMPRESSION:  1. Rotator cuff syndrome.  2. Adhesive capsulitis.  3. Chronic left shoulder pain.  4. Depression and anxiety, reactive.  5. Non-restorative sleep disorder.   PLAN:  1. I had a long discussion with the patient regarding treatment options.  At     this point, we will see how she does in aquatic therapy which she will     start next week.  2. We  will continue medications to include Lidoderm and hydrocodone, and I     have written new prescriptions for Lidoderm 5% apply up to 12 hours per     day, maximum three patches at a time #30 with two refills.  I also     provided her with a prescription for Norco 10 mg/35 mg one p.o. t.i.d.     p.r.n. #90 without refills.  The patient understands that our goal is to     maximize her function in physical therapy while decreasing her pain, and     ultimately weaning her from the narcotic based pain medication and moving     towards non-narcotic alternatives and she understands.  3. Increase Pamelor to 20 mg at bedtime.  I have instructed the patient to     try this for a week, and if her sleep is not improved and she has no     intolerable side effects to the medication to increase to 30 mg at     bedtime.  I have provided her with a prescription for Pamelor 10 mg two     to three p.o. q.h.s. #90 without refills.  4. Consider suprascapular nerve block to assist with therapy and overall     pain control.  5. The patient is to return to clinic in one month for re-evaluation.  6. The patient is to follow up with mental health  today.  7. The patient is to follow up with her primary care physician regarding     high blood pressure.   The patient was educated on the above findings and recommendations and  understands.  There were no barriers to communication.                                               Sondra Come, D.O.    JJW/MEDQ  D:  09/15/2002  T:  09/16/2002  Job:  [id number]   cc:   [name], M.D.  [address]  [place]  [place]  [address]  Fax: [phone]

## 2011-03-28 NOTE — Discharge Summary (Signed)
NAME:  [name], [name]                           ACCOUNT NO.:  [id number]   MEDICAL RECORD NO.:  [id number]                   PATIENT TYPE:  INP   LOCATION:  2029                                 FACILITY:  [facility]   PHYSICIAN:  Jonelle Sidle, M.D. [facility]        DATE OF BIRTH:  February 03, 1956   DATE OF ADMISSION:  05/27/2003  DATE OF DISCHARGE:  05/28/2003                           DISCHARGE SUMMARY - REFERRING   PROCEDURE:  Adenosine Cardiolite, May 28, 2003.   REASON FOR ADMISSION:  Please refer to dictated admission note.   LABORATORY DATA:  Cardiac enzymes:  CPK 377/10.6, 265/8.9.  Troponin I  0.01(x3).  The d-dimer is 0.30.  TSH 1.15.   HOSPITAL COURSE:  Following transfer from [facility] where the  patient initially presented with complaints of chest pain and was found to  have an elevated total CPK with normal troponin I marker, the patient  continued to have intermittent chest discomfort requiring treatment with  intravenous nitroglycerin.  However, repeat serial cardiac markers showed  normal troponin I markers with downward trending of elevated total CPK.  Additionally, a d-dimer was negative as well.   The patient was thus referred for pharmacologic stress testing.  Of note,  she had normal coronary arteries in 2002.   Adenosine Cardiolite was performed, during which the patient had no report  of chest pain, and serial EKG tracings revealed no significant  abnormalities.  Subsequent review of perfusion images revealed no evidence  of ischemia/infarction, with evidence of breast attenuation.  Calculated  ejection fraction was 60%.   No further cardiac workup was recommended.  At discharge the patient was  placed on proton pump inhibitor and instructed to cut back on aspirin to low  dose.   MEDICATIONS AT DISCHARGE:  1. Nexium 40 mg daily (new).  2. Coated aspirin 81 mg daily.  3. Norvasc 10 mg daily.  4. BuSpar 20 mg b.i.d.  5. Paxil 25 mg daily.  6. Xanax as  previously directed.  7. Hydrochlorothiazide 25 mg daily.  8. Lexapro 10 mg as directed.   INSTRUCTIONS:  The patient is instructed to arrange follow-up with her  primary care physician, Dr. Fara Chute, in the next one to two weeks.   DISCHARGE DIAGNOSES:  1. Noncardiac chest pain.     A. Normal serial troponin markers.     B. Normal adenosine Cardiolite.     C. History of normal coronary angiogram 2002.  2. Hypertension.  3. Obesity.  4. Status post recent fall.     A. Recent negative lower extremity venous Dopplers; negative d-dimer this        admission.  5. History of depression.     Gene Serpe, P.A. [facility]                      Jonelle Sidle, M.D. [facility]    GS/MEDQ  D:  05/28/2003  T:  05/29/2003  Job:  [id number]   cc:   Fara Chute  [address] [place]  [place] [address]  Fax: [phone]   [facility]  [address]  [place], [place]  [address]    cc:   Fara Chute  [address] [place]  [place] [address]  Fax: [phone]   [facility]  [address]  [place], [place]  [address]

## 2011-03-28 NOTE — Op Note (Signed)
[name], [name]                 ACCOUNT NO.:  [id number]   MEDICAL RECORD NO.:  [id number]          PATIENT TYPE:  AMB   LOCATION:  DAY                           FACILITY:  [facility]   PHYSICIAN:  Lionel December, M.D.    DATE OF BIRTH:  09-15-1956   DATE OF PROCEDURE:  08/06/2005  DATE OF DISCHARGE:                                 OPERATIVE REPORT   PROCEDURE:  Esophagogastroduodenoscopy followed by colonoscopy.   INDICATIONS:  Leaner is a 55 year old Caucasian female with multiple medical  problems who has recurrent nausea, vomiting as well as epigastric pain. She  also has diarrhea with fecal incontinence. She is status post  cholecystectomy in May of this year without symptomatic improvement, and she  also had EGD and colonoscopy by Dr. Gabriel Cirri which were normal. Her stool  studies have been negative as has been small-bowel follow-through. She has  not responded to therapy. She is undergoing diagnostic evaluation. Procedure  risks were reviewed with the patient,  and informed consent was obtained.   PREMEDICATION:  Cetacaine spray for pharyngeal topical anesthesia, Demerol  50 mg IV, Versed 10 mg IV.   FINDINGS:  Procedure performed in endoscopy suite. The patient's vital signs  and O2 saturation were monitored during the procedure and remained stable.   PROCEDURE #1:  Esophagogastroduodenoscopy. The patient was placed in left  lateral position. Olympus videoscope was passed via oropharynx without any  difficulty into esophagus.   Esophagus. Mucosa of the esophagus was normal. GE junction was at 38 cm and  was unremarkable.   Stomach. It was empty and distended very well insufflation. Folds of  proximal stomach were normal. Examination of mucosa revealed granularity and  erythema and multiple small antral polyps, and there was a cluster in the  prepyloric area. However, pyloric channel was patent. Biopsy was taken from  these polyps and submitted in one container.  Endoscopically, these appeared  to be hyperplastic polyps.   Duodenum. Scope was retroflexed to examine angularis, fundus and cardia  which were normal.   Duodenum. Bulbar mucosa was normal. Scope was passed to the second part of  duodenum where mucosa and folds were carefully examined. There was  coarsening to mucosa postbulbar area. Therefore, biopsy was taken for  routine histology. Endoscope was withdrawn. The patient prepared for  procedure #2.   Colonoscopy. Rectal examination performed. No abnormality noted on external  or digital exam. Olympus videoscope was placed in rectum and advanced under  vision into sigmoid colon beyond. Preparation was excellent. Scope was  passed into cecum which was identified by appendiceal orifice and ileocecal  valve. Short segment of TI was also examined and was normal. As the scope  was withdrawn, colonic mucosa was carefully examined. A small polyp at  proximal transverse colon which was ablated via cold biopsy. Mucosa of the  rest of the colon was normal. Random biopsies were taken from sigmoid colon,  looking for microscopic and/or collagenous colitis. Rectal mucosa was  normal. Scope was retroflexed to examine anorectal junction which was  unremarkable. Endoscope was straightened and withdrawn. The patient  tolerated the procedure well.   FINAL DIAGNOSIS:  1.  Nonerosive antral gastritis with multiple gastric polyps in prepyloric      area. Some of these were biopsied for histology.  2.  Coarse appearance to postbulbar duodenal mucosa which was biopsied.  3.  Normal terminal ileum.  4.  No endoscopic evidence of colitis. Random biopsies taken from sigmoid      colon looking for microscopic colitis.  5.  Small polyp ablated via cold biopsy from proximal transverse colon.   RECOMMENDATIONS:  She will continue anti-reflux measures and Nexium as  before. The patient will keep a stool diary. I will be contacting her with  biopsy results and  further recommendations.   We will also check her H pylori serologies today.      Lionel December, M.D.  Electronically Signed     NR/MEDQ  D:  08/06/2005  T:  08/06/2005  Job:  [id number]   cc:   Fara Chute  Fax: [phone]

## 2011-03-28 NOTE — Consult Note (Signed)
NAME:  [name], [name]                           ACCOUNT NO.:  [id number]   MEDICAL RECORD NO.:  [id number]                   PATIENT TYPE:  REC   LOCATION:  TPC                                  FACILITY:  [facility]   PHYSICIAN:  Zachary George, DO                      DATE OF BIRTH:  [birth year]   DATE OF CONSULTATION:  DATE OF DISCHARGE:                                   CONSULTATION   HISTORY:  Ms. Quinones returns to clinic today for re-evaluation.  She was last  seen on 10/18/02.  She continues to complain of left shoulder pain which has  improved overall.  Her main complaint today is pain in her left upper back  pointing to her upper trapezius region.  Her pain is an 8/10 subjective  scale.  She has been through aquatic therapy which has been helpful and  states that she needs a prescription to continue.  She does not seem like  she has plateaued and was making progress.  Her function and quality of life  indices still remain declined.  Overall her function has improved but she  states I still can't clean like I used to.  She continues on Norco 10  mg/325 mg three times daily and brings her pills with  her for appropriate  count.  She has been compliant with the medications.  She states that the  medication does help her significantly and helps her functional abilities.  I reviewed health history form and 14 point review of systems.  Denies any  numbness or paresthesias in the upper extremities.   PHYSICAL EXAMINATION:  Physical examination reveals an obese female in no  acute distress.  VITALS:  Blood pressure is 131/73, pulse 76, respirations 20, oxygen  saturation is 95% on room air.  EXTREMITIES:  Palpatory examination reveals active trigger points in the  left upper trapezius and levator scapulae muscles.  Range of motion of the  left shoulder reveals active flexion to 100 degrees and abduction to 90  degrees with discomfort.  Manual muscle testing is 5/5 bilateral upper  extremities.  Sensory examination is intact to  light touch bilateral upper  extremities.  Muscle stretch reflexes are symmetric bilateral upper  extremities.   IMPRESSION:  1. Mild fascial pain syndrome.  2. Left rotator cuff syndrome.  3. Adhesive capsulitis left shoulder.   PLAN:  1. Trial of trigger point injections to the left upper trapezius and levator     scapulae muscles.  2. Continue physical therapy for aquatic therapy advancing to land based     therapy and a  home exercise program.  3. Continue Norco 10 mg/325 mg one p.o. t.i.d. as needed.  4. Continue Lidoderm patches as needed.  5. Continue Pamelor 30 mg at bedtime for restorative sleep and myofacial     component.  6. Patient to return to  clinic in one month for re-evaluation.   PROCEDURE:  Trigger point injections left upper trapezius and levator  scapulae.  Procedure was prescribed to the patient in detail including  risks, benefits, limitations and alternatives.  Risks include bleeding,  infection, nerve injury, pneumothorax, increased pain, failure to relieve  pain.  Patient wishes to proceed and informed consent is obtained.  Patient's skin was prepped in the usual fashion with alcohol swabs.  Each  trigger point was injected with 0.5 cc of 1% Xylocaine plus 0.5 cc of 0.25%  bupivacaine using a 25 gauge 1 1/2 inch needle.  Twist responses were noted.  Patient tolerated the procedure well.   COMPLICATIONS:  There were no complications.   DISCHARGE INSTRUCTIONS:  Discharge instructions given.   CONDITION ON DISCHARGE:  Patient was released in stable condition.  Patient was educated on the above findings and recommendations and  understands.  No barriers to communication.                                                Zachary George, DO    JW/MEDQ  D:  11/22/2002  T:  11/22/2002  Job:  [id number]   cc:   [name], M.D.  [address]  [place]  [place]  [address]  Fax: [phone]

## 2011-03-28 NOTE — Consult Note (Signed)
NAME:  [name], [name]                             ACCOUNT NO.:  [id number]   MEDICAL RECORD NO.:  [id number]                   PATIENT TYPE:   LOCATION:                                       FACILITY:   PHYSICIAN:  Zachary George, DO                      DATE OF BIRTH:  [birth year]   DATE OF CONSULTATION:  10/18/2002  DATE OF DISCHARGE:                                   CONSULTATION   HISTORY OF PRESENT ILLNESS:  The patient returns to clinic today for  reevaluation.  She was last seen on September 15, 2002.  In the interim, she  has been making some progress in terms of the range of motion in her left  shoulder and she demonstrates this.  Her pain is still limiting her  functional abilities to some degree.  She rates her pain as a 7/10 on  subjective scale.  She continues with her home exercise program.  Physical  therapy is currently on hold secondary to bronchitis for which she is being  treated with antibiotics.  She continues to feel somewhat depressed.  She  continues on Norco 10 mg/325 mg t.i.d. with improvement in her pain.  Her  sleep has improved significantly with the increase of Pamelor to 30 mg at  bedtime.  She states now she can sleep seven to eight hours per night.  I  reviewed the health and history form and 14-point review of systems.   PHYSICAL EXAMINATION:  GENERAL APPEARANCE:  An obese female in no acute  distress.  VITAL SIGNS:  Blood pressure 143/76, pulse 72, respiratory rate 20, O2  saturation is 98% on room air.  The patient is somewhat tearful at times  during our clinic examination today.  EXTREMITIES:  Examination of the left shoulder reveals active flexion to 100  degrees and abduction to 90 degrees with discomfort.  There is still some  myofascial discomfort in the periscapular muscles on the left.  NEUROLOGIC:  The patient is neurologically intact in the upper extremities  bilaterally to motor, sensory and reflexes.   IMPRESSION:  1. Left rotator cuff  syndrome.  2. Adhesive capsulitis left shoulder.  3. Chronic left shoulder pain.  4. Depression and anxiety.  5. Nonrestorative sleep disorder, improved.   PLAN:  1. Continue physical therapy and home exercise program.  2. Continue Norco 10 mg/325 mg one p.o. t.i.d. as needed #90 without     refills.  Ultimately would like to wean her from the narcotic based pain     medication and she is in agreement.  Will continue for now to help     facilitate the therapy program.  3. Continue Pamelor 30 mg at bedtime.  A prescription was written for     Pamelor 10 mg three p.o. q.h.s. #90 with two refills.  4. Continue Lidoderm patches as needed.  5. Recommend follow-up with psychiatry.  6. The patient is to return to clinic in one month for reevaluation.   The patient was educated on the above findings and recommendations and  understands.  There were no barriers to communication.                                                Zachary George, DO    JW/MEDQ  D:  10/18/2002  T:  10/18/2002  Job:  [id number]   cc:   [name], M.D.  [address]  [place]  [place]  [address]  Fax: [phone]

## 2011-03-28 NOTE — Procedures (Signed)
NAMEPRESLY, STEINRUCK                 ACCOUNT NO.:  [id number]   MEDICAL RECORD NO.:  [id number]          PATIENT TYPE:  EMS   LOCATION:  ED                            FACILITY:  [facility]   PHYSICIAN:  Edward L. Juanetta Gosling, M.D.DATE OF BIRTH:  Jul 01, 1956   DATE OF PROCEDURE:  08/09/2005  DATE OF DISCHARGE:  08/09/2005                                EKG INTERPRETATION   Time 1305, August 09, 2005. The rhythm is sinus rhythm with a rate in the  60s. There is a PAC. The axis is leftward but does not meet criteria for  left axis deviation. There is a incomplete intraventricular conduction  delay. QRS voltage is high, indicating possibility of left ventricular  hypertrophy, and clinical correlation is suggested. Abnormal  electrocardiogram.      Oneal Deputy. Juanetta Gosling, M.D.  Electronically Signed     ELH/MEDQ  D:  08/11/2005  T:  08/12/2005  Job:  [id number]

## 2011-03-28 NOTE — Discharge Summary (Signed)
[place]  Patient:    [name], [name] [facility] Visit Number: [id number] MRN: [id number]          Service Type: MED Location: [phone] Attending Physician:  Rollene Rotunda Dictated by:   Jacolyn Reedy, P.A.C. Admit Date:  06/22/2001 Discharge Date: 06/23/2001   CC:         Estanislado Pandy, M.D., [address] [place], [place], [place]  [address]  [facility]   Referring Physician Discharge [facility]  ADMITTING DIAGNOSIS:  Recurrent chest pain.  DISCHARGE DIAGNOSES: 1. Chest pain, question etiology; cardiac catheterization revealed normal    coronary arteries and left ventricular function. 2. Hypertension. 3. Sinus bradycardia, on beta blocker; hypertensive medicines changed. 4. Dyslipidemia. 5. Anxiety/depression. 6. Questionable history of transient ischemic attack.  BRIEF HISTORY AND PHYSICAL:  This is a 55 year old married white female patient with recurrent chest pain.  She was admitted in June to [facility] for chest pain and at that time ruled out and had normal adenosine Cardiolite, ejection fraction 56%.  She does have some hypertension and dyslipidemia with an LDL of 128 and HDL of 34 and a questionable history of a TIA.  Because of her recurrent chest pain, she was readmitted to [facility] and CKs and MBs were borderline positive at 274/7.2 and 276/7.3 and troponins were negative.  EKG showed no acute change.  She was transferred to [facility] for further evaluation with cardiac catheterization.  HOSPITAL COURSE:  The patient underwent left heart catheterization on June 23, 2001 by Dr. Loraine Leriche Pulsipher which revealed normal coronary arteries and LV function.  She had Perclose placed for groin closure and she was ambulating and discharged home later that day.  Dr. Gerrit Friends. Rothbart did stop her metoprolol because she was bradycardic in the 40s and changed her to Altace and hydrochlorothiazide.  LABORATORY VALUES:  Sodium 141, potassium 4.3, chloride  104, CO2 26, BUN 23, creatinine 1.2.  CKs and MBs:  See above.  Hemoglobin 12.2, hematocrit 34, WBC 5.1.  Coagulation times normal.  Troponins negative.  EKG:  Sinus bradycardia with nondiagnostic lateral Q waves and minor nonspecific ST abnormality.  DISCHARGE MEDICATIONS: 1. Altace 10 mg q.d. 2. Hydrochlorothiazide 25 mg one-half a day. 3. Paxil 20 mg q.d. 4. Coated aspirin once a day.  ACTIVITIES:  She is to do no heavy lifting or strenuous activity for two to three days.  DIET:  She is to follow a low-fat diet.  FOLLOWUP:  She will follow up with Dr. Estanislado Pandy next week.  Dictated by: Jacolyn Reedy, P.A.C. Attending Physician:  Rollene Rotunda DD:  06/23/01 TD:  06/23/01 Job: [id number]

## 2011-03-28 NOTE — Consult Note (Signed)
NAME:  Jasmine, [name]                             ACCOUNT NO.:  [id number]   MEDICAL RECORD NO.:  [id number]                   PATIENT TYPE:  REC   LOCATION:                                       FACILITY:   PHYSICIAN:  Jasmine George, DO                      DATE OF BIRTH:  June 22, 1956   DATE OF CONSULTATION:  12/30/2002  DATE OF DISCHARGE:                                   CONSULTATION   HISTORY:  Ms. [name] returns to clinic today for re-evaluation and repeat  trigger point injections.  She was last seen on December 12, 2002 at which  time she underwent trigger point injections to the left upper trapezius and  levator scapulae muscles.  Ms. [name] states that trigger point injections  have given her improvement in her pain as well as range of motion.  She  continues performing a home exercise program and has finished physical  therapy.  I have not gotten any recent reports from the physical therapist  and Ms. [name] states that she will bring a report to next visit or have the  physical therapist send me a report.  If she continues to be making progress  then it is reasonable to continue with physical therapy however, if she has  plateaued would recommend simply continuing with a home exercise program.  Her function and quality of life indices have declined overall.  She feels  somewhat depressed today secondary to the death of an 4 year old neighbor  who was killed in a motor vehicle accident.  She follows up with mental  health next week.  She also follows up with her primary care [name] early  next week as well.  In addition to her left shoulder pain and upper back  pain she complains of stiffness in her hands, pointing to her  metacarpophalangeal joints.  She states that her hands ache like a  toothache.  She notes stiffness in the morning when she gets up.  This seems  to improve as the day goes on unless she is sitting still for a long period  of time then she notices the stiffness  increasing.  Running her hands under  warm water seems to improve.  She has taken anti-inflammatory medications in  the past and has noted GI upset with even Cox II inhibitor medicine which  she believes was Vioxx and Celebrex.  I reviewed health and history form and  14 point review of systems.   PHYSICAL EXAMINATION:  Physical examination reveals an obese female in no  acute distress.  Mood is depressed, affect is flat.  Blood pressure 152/90,  pulse 64, respirations 16, oxygen saturation 98% on room air.  Examination  of the upper extremities does not reveal any heat, erythema or edema.  There  was no synovitis in the hands.  There is full range of  motion of the  fingers.  There is tenderness to palpation over the metacarpophalangeal  joints bilaterally.  Range of motion of the shoulders reveals full range of  motion on the right will flexion of 100 degrees and abduction of 120 degrees  on the left.  Passive range of motion is the same.  Patient experiences  increased pain with flexion and abduction greater than 100 degrees and 120  degrees respectively with firm end points.  There are trigger points noted  in the left upper trapezius and levator scapulae today.  Patient although  appears with exquisite discomfort states that it is somewhat better than it  was previously.  Neurologically intact in the upper extremities including  motor, sensory and reflexes.   IMPRESSION:  1. Myofascial pain syndrome.  2. Left rotator cuff syndrome.  3. Adhesive capsulitis left shoulder.  4. Pain bilateral hands involving the metacarpophalangeal joints.  I suspect     osteoarthritis.  No clinical evidence of rheumatoid arthritis at this     time.  5. Depression.   PLAN:  1. Repeat trigger point injections left upper trapezius and levator scapulae     muscles.  The patient is making progress in regards to her myofascial     pain and it is reasonable to repeat the injections for continued      improvement.  2. Continue physical therapy for now as long as patient is continuing to     make progress.  3. Continue current medications including Norco 10 mg/325 mg one p.o. t.i.d.     Also continue Lidoderm patches and Pamelor 30 mg at bedtime.  4. Trial of capsaicin to bilateral hands.  Patient instructed to start with     the lowest dose.  She may obtain this over-the-counter.  5. Patient to follow up with her primary care [name] as scheduled.  6. Patient to follow up with mental health as scheduled.  7. Patient to return to clinic in two weeks for re-evaluation.  Would     consider repeating trigger point injections if patient continues to make     progress in regards to her myofascial pain complaints.   PROCEDURE:  Left upper trapezius and levator scapulae trigger point  injections.  Procedure was described to the patient in detail including  risks, benefits, limitations and alternatives.  Risks include but are not  limited to bleeding, infection, nerve injury, pneumothorax, increased pain,  failure to relieve pain, allergic reaction to medication.  Patient wishes to  proceed.  Informed consent was obtained.  Skin was prepped in usual fashion  with alcohol swabs.  Each trigger point was injected with 0.5 cc of 1%  Lidocaine plus 0.5 cc of 0.25% mepivacaine using a 25 gauge 1 1/2 inch  needle.  Twitch response was noted in the left upper trapezius.  The patient  tolerated the procedure well.  There were no complications.  Discharge  instructions were given.  The patient was released in stable condition.   Patient was educated on the above findings and recommendations and  understands.  No barriers to communication.                                               Jasmine George, DO    JW/MEDQ  D:  12/30/2002  T:  12/30/2002  Job:  [id number]   cc:  [name], M.D.  [address]  [place]  [place]  [address]  Fax: [phone]

## 2011-03-31 ENCOUNTER — Ambulatory Visit
Admission: RE | Admit: 2011-03-31 | Discharge: 2011-03-31 | Disposition: A | Discharge status: Home / Self Care | Payer: PRIVATE HEALTH INSURANCE | Source: Ambulatory Visit | Attending: Obstetrics & Gynecology | Admitting: Obstetrics & Gynecology

## 2011-03-31 DIAGNOSIS — Z1231 Encounter for screening mammogram for malignant neoplasm of breast: Secondary | ICD-10-CM

## 2011-03-31 DIAGNOSIS — Z780 Asymptomatic menopausal state: Secondary | ICD-10-CM

## 2011-04-03 ENCOUNTER — Other Ambulatory Visit: Payer: Self-pay | Admitting: Obstetrics & Gynecology

## 2011-04-03 DIAGNOSIS — R928 Other abnormal and inconclusive findings on diagnostic imaging of breast: Secondary | ICD-10-CM

## 2011-04-11 ENCOUNTER — Other Ambulatory Visit: Payer: Self-pay | Admitting: Obstetrics & Gynecology

## 2011-04-11 ENCOUNTER — Ambulatory Visit
Admission: RE | Admit: 2011-04-11 | Discharge: 2011-04-11 | Disposition: A | Discharge status: Home / Self Care | Payer: PRIVATE HEALTH INSURANCE | Source: Ambulatory Visit | Attending: Obstetrics & Gynecology | Admitting: Obstetrics & Gynecology

## 2011-04-11 DIAGNOSIS — R928 Other abnormal and inconclusive findings on diagnostic imaging of breast: Secondary | ICD-10-CM

## 2011-04-16 ENCOUNTER — Other Ambulatory Visit: Payer: Self-pay | Admitting: Diagnostic Radiology

## 2011-04-16 ENCOUNTER — Ambulatory Visit
Admission: RE | Admit: 2011-04-16 | Discharge: 2011-04-16 | Disposition: A | Discharge status: Home / Self Care | Payer: PRIVATE HEALTH INSURANCE | Source: Ambulatory Visit | Attending: Obstetrics & Gynecology | Admitting: Obstetrics & Gynecology

## 2011-04-16 ENCOUNTER — Ambulatory Visit: Admission: RE | Admit: 2011-04-16 | Payer: PRIVATE HEALTH INSURANCE | Source: Ambulatory Visit

## 2011-04-16 ENCOUNTER — Other Ambulatory Visit: Payer: Self-pay | Admitting: Obstetrics & Gynecology

## 2011-04-16 DIAGNOSIS — R928 Other abnormal and inconclusive findings on diagnostic imaging of breast: Secondary | ICD-10-CM

## 2011-04-30 ENCOUNTER — Encounter: Payer: PRIVATE HEALTH INSURANCE | Attending: Surgery | Admitting: *Deleted

## 2011-04-30 DIAGNOSIS — Z9884 Bariatric surgery status: Secondary | ICD-10-CM | POA: Insufficient documentation

## 2011-04-30 DIAGNOSIS — Z09 Encounter for follow-up examination after completed treatment for conditions other than malignant neoplasm: Secondary | ICD-10-CM | POA: Insufficient documentation

## 2011-04-30 DIAGNOSIS — Z713 Dietary counseling and surveillance: Secondary | ICD-10-CM | POA: Insufficient documentation

## 2011-05-15 ENCOUNTER — Ambulatory Visit: Payer: Medicaid Other | Admitting: *Deleted

## 2011-06-10 ENCOUNTER — Encounter (INDEPENDENT_AMBULATORY_CARE_PROVIDER_SITE_OTHER): Payer: Self-pay | Admitting: General Surgery

## 2011-06-12 ENCOUNTER — Encounter (INDEPENDENT_AMBULATORY_CARE_PROVIDER_SITE_OTHER): Payer: Self-pay | Admitting: Surgery

## 2011-06-12 ENCOUNTER — Ambulatory Visit (INDEPENDENT_AMBULATORY_CARE_PROVIDER_SITE_OTHER): Payer: PRIVATE HEALTH INSURANCE | Admitting: Surgery

## 2011-06-12 DIAGNOSIS — Z9884 Bariatric surgery status: Secondary | ICD-10-CM

## 2011-06-12 DIAGNOSIS — E65 Localized adiposity: Secondary | ICD-10-CM

## 2011-06-12 NOTE — Progress Notes (Signed)
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 Today's BMI is 31.8 and she has lost 68% of her excess weight. Today's weight 191.2 is down from her preoperative weight of 332.  On her last visit we noted her abdominal pannus which has been macerated and creating foul odors. She appealed to [employer] and a half certified her to have an abdominal panniculectomy(CPT code 16109 [place] closed valid from July 26 through September 05, 2011. She wants me to perform this for her. Indicated that we would likely remove her umbilicus as well as the skin below that. She said that would be fine. He she emphasized to them this is not about cosmesis at about her functionality.  She is off all of her medications and has recently gotten her some the false teeth and is feeling great. I will go ahead and schedule for abdominal panniculectomy.

## 2011-06-30 ENCOUNTER — Encounter: Payer: PRIVATE HEALTH INSURANCE | Attending: Surgery | Admitting: *Deleted

## 2011-06-30 ENCOUNTER — Encounter: Payer: Self-pay | Admitting: *Deleted

## 2011-06-30 DIAGNOSIS — Z9884 Bariatric surgery status: Secondary | ICD-10-CM | POA: Insufficient documentation

## 2011-06-30 DIAGNOSIS — Z9889 Other specified postprocedural states: Secondary | ICD-10-CM | POA: Insufficient documentation

## 2011-06-30 DIAGNOSIS — Z713 Dietary counseling and surveillance: Secondary | ICD-10-CM | POA: Insufficient documentation

## 2011-06-30 DIAGNOSIS — Z09 Encounter for follow-up examination after completed treatment for conditions other than malignant neoplasm: Secondary | ICD-10-CM | POA: Insufficient documentation

## 2011-06-30 NOTE — Patient Instructions (Signed)
Goals:  Follow Phase 3B: High Protein + Non-Starchy Vegetables  Eat 3-6 small meals/snacks, every 3-5 hrs  Increase lean protein foods to meet 60-80g goal  Increase fluid intake to 64oz +  Add 1/2 cup of carbohydrate (fruit, whole grain, starchy vegetable) with meals  Avoid drinking 15 minutes before, during and 30 minutes after eating  Aim for >30 min of physical activity daily

## 2011-06-30 NOTE — Progress Notes (Signed)
  Follow-up visit: 10 Months Post-Operative Gastric Bypass Surgery  Medical Nutrition Therapy:  Appt start time: 0930 end time:  1000.  Assessment:  Primary concerns today: post-operative bariatric surgery nutrition management. Pt is due to have abdominal panniculectomy per Dr. Daphine Deutscher on 07/15/11.  Weight today: 186.5 lbs Weight change:8.1 lbs Total weight lost: 130 lbs BMI: 31.1 % Weight goal: 150-180lbs % Weight goal met: 78%  24-hr recall:  B (8 AM): yogurt Snk (11  AM): Special K protein drink (10g)   L (12-1 PM): 2 oz cheese, 4 crackers Snk (3 PM): 4 crackers  D (6-7 PM): 3 oz fish, 1/2 cup salad Snk (8-9 PM): yogurt  Fluid intake: all sugar-free beverages, crystal light = 60-70 oz Estimated total protein intake: 50-60g  Medications: Pt reports that her PCP put her on a pain pill but she cannot remember the name Supplementation: Taking gastric bypass supplements 50% of the time  Using straws: No Drinking while eating:No Hair loss: No  Carbonated beverages: No  N/V/D/C: No  Dumping syndrome: No  Recent physical activity:  Limited due to knee and back pain. Tries to work in the yard and walk as much possible.  Progress Towards Goal(s):  In progress.   Nutritional Diagnosis:  [place]-3.3 Overweight/obesity As related to recent gastric bypass surgery.  As evidenced by pt continuing to follow gastric bypass nutrition guidelines.  Inadequate protein intake related to poor appetite/intolerance of meats as evidenced by pt consuming 75-80% estimated protein needs.    Intervention:    Follow Phase 3B: High Protein + Non-Starchy Vegetables  Eat 3-6 small meals/snacks, every 3-5 hrs  Increase lean protein foods to meet 60-80g goal  Increase fluid intake to 64oz +  Add 1/2 cup of carbohydrate (fruit, whole grain, starchy vegetable) with meals  Avoid drinking 15 minutes before, during and 30 minutes after eating  Aim for >30 min of physical activity  daily  Monitoring/Evaluation:  Dietary intake, exercise, and body weight. Follow up in 2 months for 12 month post-op visit.

## 2011-07-02 ENCOUNTER — Encounter (INDEPENDENT_AMBULATORY_CARE_PROVIDER_SITE_OTHER): Payer: Self-pay | Admitting: Surgery

## 2011-07-02 ENCOUNTER — Ambulatory Visit (INDEPENDENT_AMBULATORY_CARE_PROVIDER_SITE_OTHER): Payer: PRIVATE HEALTH INSURANCE | Admitting: Surgery

## 2011-07-02 VITALS — BP 150/88 | HR 72 | Temp 97.2°F | Ht 63.5 in | Wt 184.5 lb

## 2011-07-02 DIAGNOSIS — M793 Panniculitis, unspecified: Secondary | ICD-10-CM

## 2011-07-02 NOTE — Progress Notes (Signed)
Subjective:     Patient ID: [name], female   DOB: 1956-07-19, 55 y.o.   MRN: [id number]  HPIMrs. Dollar comes in today for a preop visit. She is scheduled to have an abdominal panniculectomy on September 4. She has a very redundant pannus that hangs down over a second mons pubis pannus which I may have to excise also.  I reiterated that she would likely not having an umbilicus after this procedure. I will likely resect the umbilicus in the elliptical skin and fat that I plan to resect.  She will be 11 months postop at that time and she has lost well over 100 pounds since then. This is about 60% of her body weight. Today's BMI as indicated was 32.  She is excited to have the surgery. She is aware of the risks. I indicated that she may have a VAC wound device attached for a while and she couldn't be in the hospital for several days. Current Outpatient Prescriptions  Medication Sig Dispense Refill  . ALPRAZolam (XANAX) 0.25 MG tablet Take 1 mg by mouth QID.       Marland Kitchen doxepin (SINEQUAN) 50 MG capsule Take 50 mg by mouth.        Marland Kitchen HYDROcodone-acetaminophen (NORCO) 10-325 MG per tablet Take 1 tablet by mouth 2 times daily at 12 noon and 4 pm.        . escitalopram (LEXAPRO) 10 MG tablet Take 10 mg by mouth daily.         Past Medical History  Diagnosis Date  . Night sweats   . Breast lump in female   . Full dentures   . Hypertension     in the past  . GERD (gastroesophageal reflux disease)     in the past  . Colon polyp   . HX: benign breast biopsy    Past Surgical History  Procedure Date  . Shoulder surgery     x3  . Abdominal hysterectomy 1989  . Cardiac catheterization 2001   Allergies  Allergen Reactions  . Morphine And Related Nausea Only  . Phenergan     Heart race       Review of Systems  Constitutional: Negative.   HENT: Negative.   Eyes: Negative.   Respiratory: Negative.   Cardiovascular: Negative.   Gastrointestinal: Negative.   Genitourinary: Negative.     Musculoskeletal: Positive for arthralgias.       Left hip pain  Skin: Positive for rash.       Very significant maceration of her abdominal panniculus and mons pubis pannis  Hematological: Negative.   Psychiatric/Behavioral: Negative.        Objective:   Physical Exam  Constitutional: She appears well-developed and well-nourished.  HENT:  Head: Normocephalic and atraumatic.  Eyes: Conjunctivae are normal. Pupils are equal, round, and reactive to light.  Neck: Normal range of motion.  Cardiovascular: Normal rate and regular rhythm.   Pulmonary/Chest: Effort normal and breath sounds normal.  Abdominal:       Very significant pannus of the lower abdomen  Musculoskeletal:       Left hip pain  Skin: Rash noted.  Psychiatric: She has a normal mood and affect. Her behavior is normal. Judgment and thought content normal.       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

## 2011-07-03 ENCOUNTER — Other Ambulatory Visit (INDEPENDENT_AMBULATORY_CARE_PROVIDER_SITE_OTHER): Payer: Self-pay | Admitting: Surgery

## 2011-07-03 ENCOUNTER — Encounter (HOSPITAL_COMMUNITY): Payer: PRIVATE HEALTH INSURANCE

## 2011-07-03 ENCOUNTER — Ambulatory Visit (HOSPITAL_COMMUNITY)
Admission: RE | Admit: 2011-07-03 | Discharge: 2011-07-03 | Disposition: A | Discharge status: Home / Self Care | Payer: PRIVATE HEALTH INSURANCE | Source: Ambulatory Visit | Attending: Surgery | Admitting: Surgery

## 2011-07-03 DIAGNOSIS — I498 Other specified cardiac arrhythmias: Secondary | ICD-10-CM | POA: Insufficient documentation

## 2011-07-03 DIAGNOSIS — I10 Essential (primary) hypertension: Secondary | ICD-10-CM | POA: Insufficient documentation

## 2011-07-03 DIAGNOSIS — Z01818 Encounter for other preprocedural examination: Secondary | ICD-10-CM

## 2011-07-03 DIAGNOSIS — I4510 Unspecified right bundle-branch block: Secondary | ICD-10-CM | POA: Insufficient documentation

## 2011-07-03 DIAGNOSIS — M793 Panniculitis, unspecified: Secondary | ICD-10-CM | POA: Insufficient documentation

## 2011-07-03 DIAGNOSIS — Z01812 Encounter for preprocedural laboratory examination: Secondary | ICD-10-CM | POA: Insufficient documentation

## 2011-07-03 DIAGNOSIS — Z01810 Encounter for preprocedural cardiovascular examination: Secondary | ICD-10-CM | POA: Insufficient documentation

## 2011-07-03 LAB — BASIC METABOLIC PANEL
BUN: 13 mg/dL (ref 6–23)
CO2: 27 mEq/L (ref 19–32)
Chloride: 105 mEq/L (ref 96–112)
Creatinine, Ser: 0.81 mg/dL (ref 0.50–1.10)
GFR calc Af Amer: >60 mL/min (ref >60)
Potassium: 4.4 mEq/L (ref 3.5–5.1)

## 2011-07-03 LAB — CBC
HCT: 42.2 % (ref 36.0–46.0)
Hemoglobin: 14.4 g/dL (ref 12.0–15.0)
MCV: 85.9 fL (ref 78.0–100.0)
RBC: 4.91 MIL/uL (ref 3.87–5.11)
RDW: 14.2 % (ref 11.5–15.5)
WBC: 6.4 10*3/uL (ref 4.0–10.5)

## 2011-07-15 ENCOUNTER — Inpatient Hospital Stay (HOSPITAL_COMMUNITY)
Admission: RE | Admit: 2011-07-15 | Discharge: 2011-07-18 | DRG: 621 | Disposition: A | Discharge status: Home / Self Care | Payer: PRIVATE HEALTH INSURANCE | Source: Ambulatory Visit | Attending: Surgery | Admitting: Surgery

## 2011-07-15 ENCOUNTER — Other Ambulatory Visit (INDEPENDENT_AMBULATORY_CARE_PROVIDER_SITE_OTHER): Payer: Self-pay | Admitting: Surgery

## 2011-07-15 DIAGNOSIS — I10 Essential (primary) hypertension: Secondary | ICD-10-CM | POA: Diagnosis present

## 2011-07-15 DIAGNOSIS — M793 Panniculitis, unspecified: Secondary | ICD-10-CM

## 2011-07-15 DIAGNOSIS — E65 Localized adiposity: Principal | ICD-10-CM | POA: Diagnosis present

## 2011-07-15 DIAGNOSIS — Z6832 Body mass index (BMI) 32.0-32.9, adult: Secondary | ICD-10-CM

## 2011-07-15 DIAGNOSIS — Z01818 Encounter for other preprocedural examination: Secondary | ICD-10-CM

## 2011-07-15 DIAGNOSIS — Z01812 Encounter for preprocedural laboratory examination: Secondary | ICD-10-CM

## 2011-07-15 DIAGNOSIS — Z9884 Bariatric surgery status: Secondary | ICD-10-CM

## 2011-07-15 DIAGNOSIS — Z01810 Encounter for preprocedural cardiovascular examination: Secondary | ICD-10-CM

## 2011-07-15 DIAGNOSIS — F411 Generalized anxiety disorder: Secondary | ICD-10-CM | POA: Diagnosis present

## 2011-07-15 HISTORY — PX: PANNICULECTOMY: SUR1001

## 2011-07-16 LAB — BASIC METABOLIC PANEL
CO2: 27 mEq/L (ref 19–32)
Chloride: 104 mEq/L (ref 96–112)
Creatinine, Ser: 0.98 mg/dL (ref 0.50–1.10)
Glucose, Bld: 121 mg/dL — ABNORMAL HIGH (ref 70–99)
Sodium: 136 mEq/L (ref 135–145)

## 2011-07-16 LAB — CBC
Hemoglobin: 11.6 g/dL — ABNORMAL LOW (ref 12.0–15.0)
MCH: 28.4 pg (ref 26.0–34.0)
MCV: 86 fL (ref 78.0–100.0)
Platelets: 177 10*3/uL (ref 150–400)
RBC: 4.08 MIL/uL (ref 3.87–5.11)
WBC: 8.8 10*3/uL (ref 4.0–10.5)

## 2011-07-16 NOTE — Op Note (Signed)
  [name], [name]                 ACCOUNT NO.:  [id number]  MEDICAL RECORD NO.:  [id number]  LOCATION:  0002                         FACILITY:  [facility]  PHYSICIAN:  Thornton Park. Daphine Deutscher, MD  DATE OF BIRTH:  08/06/1956  DATE OF PROCEDURE: DATE OF DISCHARGE:                              OPERATIVE REPORT   PREOPERATIVE DIAGNOSIS:  Macerated abdominal pannus, status post loss of some 150 pounds after Roux-en-Y gastric bypass 10 months ago.  PROCEDURE:  Abdominal panniculectomy (7.8 pounds of fat and skin). Closure primarily with wound vacuum-assisted closure on the incision.  SURGEON:  Thornton Park. Daphine Deutscher, M.D.  ASSISTANT:  Sandria Bales. Ezzard Standing, M.D.  ANESTHESIA:  General endotracheal.  DESCRIPTION OF THE PROCEDURE:  This 55 year old lady was measured in the holding area and marked to remove her abdominal pannus.  She was then taken back into room 1, where after general anesthesia was administered, I scrubbed tear with Hibiclens including her perineum and pannus completely.  A Foley was inserted, and then she was prepped all the way to the bedside and sterile drapes placed there.  Once the time-out was performed, talc loops were placed in the abdomen pannus which was held up and the elliptical incision was created above and below.  We used the LigaSure to go through the abdominal pannus taken down to the fascia. In her umbilicus, she had some old suture material there which she appeared to have had a closure of the umbilical defect primarily.  We removed the umbilicus along with the specimen as I described to her.  It was then sent to pathology where it was weighted at 7.8 pounds.  Hemostasis was achieved.  There was one bleeder, which was controlled with figure-of-eight suture of 3-0 Vicryl.  3-0 Vicryls were then used to approximate the subcutaneous tissue.  Tisseel was applied and the above flaps superiorly, and then the wound was closed with interrupted vertical mattress sutures of  3-0 nylon.  Neosporin was applied on the incision and then a long strip of foam was placed over the incision and a wound VAC with two pumps was placed on either side to dry down on the incision itself.  The patient seemed to tolerate the procedure well.  She was taken to recovery room in satisfactory addition.     Thornton Park Daphine Deutscher, MD     MBM/MEDQ  D:  07/15/2011  T:  07/15/2011  Job:  [id number]  cc:   Erasmo Downer, MD  Electronically Signed by Luretha Murphy MD on 07/16/2011 01:40:42 PM

## 2011-07-23 ENCOUNTER — Encounter (INDEPENDENT_AMBULATORY_CARE_PROVIDER_SITE_OTHER): Payer: Self-pay | Admitting: Surgery

## 2011-07-23 ENCOUNTER — Ambulatory Visit (INDEPENDENT_AMBULATORY_CARE_PROVIDER_SITE_OTHER): Payer: PRIVATE HEALTH INSURANCE | Admitting: Surgery

## 2011-07-23 VITALS — BP 124/82 | HR 52 | Temp 97.0°F | Ht 63.5 in | Wt 176.0 lb

## 2011-07-23 DIAGNOSIS — Z9889 Other specified postprocedural states: Secondary | ICD-10-CM

## 2011-07-23 NOTE — Patient Instructions (Signed)
Continue to shower daily.  Applied antibiotic ointment daily. Return in one week for remainder of sutures to be removed.

## 2011-07-23 NOTE — Progress Notes (Signed)
[name] returns today 8 days after her abdominal panniculectomy. Her flaps looked good and there is no evidence of infection. Every other vertical mattress suture was removed today and half-inch Steri-Strips applied. She will return in one week for the balance of the staples to be removed. Plan return one week

## 2011-07-23 NOTE — H&P (Signed)
  NAMEBELVIA, [name]                 ACCOUNT NO.:  [id number]  MEDICAL RECORD NO.:  [id number]  LOCATION:  1536                         FACILITY:  [facility]  PHYSICIAN:  Thornton Park. Daphine Deutscher, MD  DATE OF BIRTH:  Apr 02, 1956  DATE OF ADMISSION:  07/15/2011 DATE OF DISCHARGE:  07/18/2011                             HISTORY & PHYSICAL   CHIEF COMPLAINT:  Abdominal panniculitis.  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  She has lost about 150 pounds.  She developed maceration of her abdominal pannus in her mons region that I was photo documented and approval was obtained for panniculectomy.  Informed consent was obtained regarding excision of her umbilicus and the risks and benefits of this procedure.  She wanted to proceed.  She is followed by Dr. Arbutus Ped __________.  PAST SURGERIES:  Includes; 1. Previous laparoscopic cholecystectomy. 2. Left shoulder surgery. 3. Hysterectomy. 4. Gastric bypass October 2011.  ALLERGIES:  To morphine causing vomiting and Phenergan she says gives her increased heart beat.  SOCIAL HISTORY:  She denied use of alcohol or tobacco.  PHYSICAL EXAM:  VITAL SIGNS:  Weight is 160 pounds.  Pre-intervention weight was over 300 pounds __________ height.  Her BMI was approximately low 30s.  Blood pressure of 150/80, pulse 50, afebrile, respirations 18. HEENT EXAM:  unremarkable. NECK:  Supple. CHEST:  Clear. HEART:  Sinus rhythm without murmurs or gallops. ABDOMEN:  Marked redundant pannus with odor beneath the pannus secondary to maceration and breakdown.  All other incisions are healed. EXTREMITIES EXAM:  Full range of motion.  No history of DVT.  No cyanosis or swelling. NEURO:  Alert and oriented x3.  Motor and sensory function grossly intact.  IMPRESSION:  Abdominal panniculitis.  PLAN:  Panniculectomy.     Thornton Park Daphine Deutscher, MD     MBM/MEDQ  D:  07/18/2011   T:  07/18/2011  Job:  [id number]  Electronically Signed by Luretha Murphy MD on 07/23/2011 07:40:20 AM

## 2011-07-23 NOTE — Discharge Summary (Signed)
  NAMEERRICKA, [name]                 ACCOUNT NO.:  [id number]  MEDICAL RECORD NO.:  [id number]  LOCATION:  1536                         FACILITY:  [facility]  PHYSICIAN:  Thornton Park. 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  COURSE IN HOSPITAL:  This 55 year old white female underwent the above- mentioned operation with primary closure and incisional VAC placement. She has done well with this.  Her VAC retrieved a modest to minimal amount of serosanguineous drainage.  She was observed in the hospital for 3 days and she tried to regain her mobility and on postop day #3, the incisional VAC was removed.  Incision looked fine.  Neosporin ointment was placed on this and she was instructed to apply this twice daily along with shower twice daily and dry the area with a hair dryer on low setting.  She will be seen back in the office in approximately 5 days to consider taking some of her vertical mattress staples out.  She was given a prescription for 200 mL of Roxicet Elixir to take if needed for pain.  CONDITION:  Good.     Thornton Park Daphine Deutscher, MD     MBM/MEDQ  D:  07/18/2011  T:  07/18/2011  Job:  [id number]  Electronically Signed by Luretha Murphy MD on 07/23/2011 07:40:16 AM

## 2011-08-01 ENCOUNTER — Encounter (INDEPENDENT_AMBULATORY_CARE_PROVIDER_SITE_OTHER): Payer: Self-pay | Admitting: Surgery

## 2011-08-01 ENCOUNTER — Ambulatory Visit (INDEPENDENT_AMBULATORY_CARE_PROVIDER_SITE_OTHER): Payer: PRIVATE HEALTH INSURANCE | Admitting: Surgery

## 2011-08-01 VITALS — BP 124/84 | HR 52 | Temp 97.6°F | Resp 16 | Ht 65.5 in | Wt 182.0 lb

## 2011-08-01 DIAGNOSIS — Z9884 Bariatric surgery status: Secondary | ICD-10-CM

## 2011-08-01 NOTE — Progress Notes (Signed)
Aspirated about 150 cc of clear seroma from her panniculectomy site.  Mattress sutures in place.   Will see again on Wednesday.  Will leave sutures until next week which will be 3 weeks.   Rx for Percocet 5/325  #40

## 2011-08-06 ENCOUNTER — Ambulatory Visit (INDEPENDENT_AMBULATORY_CARE_PROVIDER_SITE_OTHER): Payer: PRIVATE HEALTH INSURANCE | Admitting: Surgery

## 2011-08-06 ENCOUNTER — Encounter (INDEPENDENT_AMBULATORY_CARE_PROVIDER_SITE_OTHER): Payer: Self-pay | Admitting: Surgery

## 2011-08-06 VITALS — BP 157/94 | HR 56 | Temp 97.3°F | Resp 16 | Ht 63.5 in | Wt 180.0 lb

## 2011-08-06 DIAGNOSIS — Z9889 Other specified postprocedural states: Secondary | ICD-10-CM

## 2011-08-06 NOTE — Progress Notes (Signed)
Right flantk area was preped with cholorohexidene and an 18 gauge needle was inserted and 60 cc of serosanguinous fluid was removed.  I then applied pressure across the incision and the 18 gauge needle was allowed to drain into two basins steriley.  Abdominal binder ordered.  French Ana will see next week.  More of skin sutures removed.

## 2011-08-08 ENCOUNTER — Other Ambulatory Visit (INDEPENDENT_AMBULATORY_CARE_PROVIDER_SITE_OTHER): Payer: Self-pay

## 2011-08-08 ENCOUNTER — Ambulatory Visit (INDEPENDENT_AMBULATORY_CARE_PROVIDER_SITE_OTHER): Payer: PRIVATE HEALTH INSURANCE

## 2011-08-08 DIAGNOSIS — Z4802 Encounter for removal of sutures: Secondary | ICD-10-CM

## 2011-08-08 DIAGNOSIS — M793 Panniculitis, unspecified: Secondary | ICD-10-CM

## 2011-08-08 MED ORDER — OXYCODONE-ACETAMINOPHEN 5-325 MG PO TABS: 1.0000 | ORAL_TABLET | Freq: Four times a day (QID) | ORAL | Status: DC | PRN

## 2011-08-08 NOTE — Progress Notes (Signed)
The patient comes to the clinic today for removal of the rest of her sutures. Incision looks well, no redness, or dehiscence. Had Dr Johna Sheriff evaluate the patients abdomen. She has been to the office several times for drainage of a saroma. He drained the area from the right side, 120ccs of fluid removed. Pt is to return to the clinic on Monday for evaluation. Pt was told to use a binder around her waist and has yet to purchase one.

## 2011-08-11 ENCOUNTER — Ambulatory Visit (INDEPENDENT_AMBULATORY_CARE_PROVIDER_SITE_OTHER): Payer: PRIVATE HEALTH INSURANCE | Admitting: Surgery

## 2011-08-11 DIAGNOSIS — Z9889 Other specified postprocedural states: Secondary | ICD-10-CM

## 2011-08-11 NOTE — Progress Notes (Signed)
The patient was seen this morning to evaluate an area that has been drained several times due to a saroma. Area feels fluid filled again, pt was asked to come back to be seen in the urgent clinic today and is unable to come back. Scheduled her to be seen tomorrow at 2:15 in urgent clinic. Pt is not wearing the binder that was suggested last week.

## 2011-08-12 ENCOUNTER — Encounter (INDEPENDENT_AMBULATORY_CARE_PROVIDER_SITE_OTHER): Payer: Self-pay | Admitting: Surgery

## 2011-08-12 ENCOUNTER — Ambulatory Visit (INDEPENDENT_AMBULATORY_CARE_PROVIDER_SITE_OTHER): Payer: PRIVATE HEALTH INSURANCE | Admitting: Surgery

## 2011-08-12 VITALS — BP 136/82 | HR 60 | Temp 97.1°F | Resp 16 | Ht 63.5 in | Wt 176.4 lb

## 2011-08-12 DIAGNOSIS — IMO0002 Reserved for concepts with insufficient information to code with codable children: Secondary | ICD-10-CM

## 2011-08-12 DIAGNOSIS — M793 Panniculitis, unspecified: Secondary | ICD-10-CM

## 2011-08-12 MED ORDER — OXYCODONE-ACETAMINOPHEN 5-325 MG PO TABS: 1.0000 | ORAL_TABLET | Freq: Four times a day (QID) | ORAL | Status: AC | PRN

## 2011-08-12 NOTE — Patient Instructions (Addendum)
Wear a Binder/Corset to help collapse the seroma fluid pocket  Bulb Drain Home Care A bulb drain is a small, plastic reservoir which creates a gentle suction. It is used to remove excess fluid from a surgical wound. The color and amount of fluid will vary. Immediately after surgery, the fluid is bright red. It may gradually change to a yellow color. When the amount decreases to about 1 or 2 tablespoons (15 to 30 cc) per 24 hours, your caregiver will usually remove it. DAILY CARE  Keep the bulb compressed at all times, except while emptying it. The compression creates suction.   Keep sites where the tubes enter the skin dry and covered with a light bandage (dressing).   Tape the tubes to your skin, 1 to 2 inches below the insertion sites, to keep from pulling on your stitches. Tubes are stitched in place and will not slip out.   Pin the bulb to your shirt (not to your pants) with a safety pin.   For the first few days after surgery, there usually is more fluid in the bulb. Empty the bulb whenever it becomes half full because the bulb does not create enough suction if it is too full. Include this amount in your 24 hour totals.   When the amount of drainage decreases, empty the bulb at the same time every day. Write down the amounts and the 24 hour totals. Your caregiver will want to know them. This helps your caregiver know when the tubes can be removed.   Once you are home, it is no longer necessary to strip the tubes as may have been done in the hospital. If there is drainage around the tube sites, change dressings and keep the area dry. If you see a clot in the tube, leave it alone. However, if the tube does not appear to be draining, let your caregiver know.  TO EMPTY THE BULB  Open the stopper to release suction.   Holding the stopper out of the way, pour drainage into the measuring cup that was sent home with you.   Measure and write down the amount. If there are 2 bulbs, note the amount  of drainage from bulb 1 or bulb 2 and keep the totals separate. Your caregiver will want to know which tube is draining more.   Compress the bulb by folding it in half.   Replace the stopper.   Check the tape that holds the tube to your skin, and pin the bulb to your shirt.  SEEK MEDICAL CARE IF:  The drainage develops a bad odor.   You have an oral temperature above 102F.   The amount of drainage from your wound suddenly increases or decreases.   You accidentally pull out your drain.   You have any other questions or concerns.  MAKE SURE YOU:  Understand these instructions.   Will watch your condition.   Will get help right away if you are not doing well or get worse.  If you have any questions or concerns, call the medical/surgical station. Document Released: 10/24/2000 Document Re-Released: 04/16/2010 [employer] Patient Information 2011 [employer].her and her and he is in

## 2011-08-12 NOTE — Progress Notes (Signed)
Subjective:     Patient ID: [name], female   DOB: 26-Feb-1956, 55 y.o.   MRN: [id number]  HPI  Patient Care Team: Toma Deiters as PCP - General (Unknown Physician Specialty)  This patient is a 55 y.o.female who presents today for surgical evaluation.   Procedure: Panniculectomy with closure 07/15/2011 by Dr. Daphine Deutscher.  Patient notes increasing pain along her right anterior flank. She's had numerous aspirations of a fluid pocket over there. Last one was last week. Nothing has grown on cultures. No pus. She notes the pain returned. She was worried that a fluid pocket has returned.  Past Medical History  Diagnosis Date  . Night sweats   . Breast lump in female   . Full dentures   . Hypertension     in the past  . GERD (gastroesophageal reflux disease)     in the past  . Colon polyp   . HX: benign breast biopsy     Past Surgical History  Procedure Date  . Shoulder surgery     x3  . Abdominal hysterectomy 1989  . Cardiac catheterization 2001  . Panniculectomy 07/15/11    History   Social History  . Marital Status: Married    Spouse Name: N/A    Number of Children: N/A  . Years of Education: N/A   Occupational History  . Not on file.   Social History Main Topics  . Smoking status: Never Smoker   . Smokeless tobacco: Not on file  . Alcohol Use: No  . Drug Use: No  . Sexually Active:    Other Topics Concern  . Not on file   Social History Narrative  . No narrative on file    Family History  Problem Relation Age of Onset  . Heart disease Mother   . Cancer Sister     lung  . Hypertension Sister     Current outpatient prescriptions:ALPRAZolam (XANAX) 1 MG tablet, Take 1 mg by mouth at bedtime as needed.  , Disp: , Rfl: ;  doxepin (SINEQUAN) 50 MG capsule, Take 50 mg by mouth.  , Disp: , Rfl: ;  escitalopram (LEXAPRO) 10 MG tablet, Take 10 mg by mouth daily.  , Disp: , Rfl: ;  HYDROcodone-acetaminophen (NORCO) 10-325 MG per tablet, Take 1 tablet by mouth 2  times daily at 12 noon and 4 pm. , Disp: , Rfl:  oxyCODONE-acetaminophen (ROXICET) 5-325 MG per tablet, Take 1-2 tablets by mouth every 6 (six) hours as needed for pain., Disp: 40 tablet, Rfl: 0  Allergies  Allergen Reactions  . Morphine And Related Nausea Only  . Phenergan     Heart race       and there is  Review of Systems  Constitutional: Negative for fever, chills, diaphoresis, appetite change and fatigue.  HENT: Negative for ear pain, sore throat, trouble swallowing, neck pain and ear discharge.   Eyes: Negative for photophobia, discharge and visual disturbance.  Respiratory: Negative for cough, choking, chest tightness and shortness of breath.   Cardiovascular: Negative for chest pain and palpitations.  Gastrointestinal: Negative for nausea, vomiting, abdominal pain, diarrhea, constipation, anal bleeding and rectal pain.  Genitourinary: Negative for dysuria, frequency and difficulty urinating.  Musculoskeletal: Negative for myalgias and gait problem.  Skin: Negative for color change, pallor and rash.  Neurological: Negative for dizziness, speech difficulty, weakness and numbness.  Hematological: Negative for adenopathy.  Psychiatric/Behavioral: Negative for confusion and agitation. The patient is not nervous/anxious.  Objective:   Physical Exam  Constitutional: She is oriented to person, place, and time. She appears well-developed and well-nourished. No distress.  HENT:  Head: Normocephalic.  Mouth/Throat: Oropharynx is clear and moist. No oropharyngeal exudate.  Eyes: Conjunctivae and EOM are normal. Pupils are equal, round, and reactive to light. No scleral icterus.  Neck: Normal range of motion. No tracheal deviation present.  Cardiovascular: Normal rate and intact distal pulses.   Pulmonary/Chest: Effort normal. No respiratory distress. She exhibits no tenderness.  Abdominal: Soft. She exhibits no distension. There is tenderness. There is no rebound. Hernia  confirmed negative in the right inguinal area and confirmed negative in the left inguinal area.       Incisions clean with normal healing ridges.  No hernias.  Tenderness over right flank. Probable fluid waves although redundant fatty tissue is mobile as well, mimicking that  After obtaining informed consent, I placed a seroma catheter into the recurrent seroma. I used a local anesthetic first.  I aspirated thin bloody fluid. 100 mL out immediately. She felt much better.  Genitourinary: No vaginal discharge found.  Musculoskeletal: Normal range of motion. She exhibits no tenderness.  Lymphadenopathy:       Right: No inguinal adenopathy present.       Left: No inguinal adenopathy present.  Neurological: She is alert and oriented to person, place, and time. No cranial nerve deficit. She exhibits normal muscle tone. Coordination normal.  Skin: Skin is warm and dry. No rash noted. She is not diaphoretic.  Psychiatric: She has a normal mood and affect. Her behavior is normal.       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 Improved with drain.    Plan:     I recommended she keep the drain until the output is less than 30 mL a day for at least 2 days. We gave her educational material.  No obvious infection. Therefore, I do not feel she requires any antibiotics.  Return to clinic in one to 2 weeks for close followup. She felt reassured

## 2011-08-20 ENCOUNTER — Encounter: Payer: PRIVATE HEALTH INSURANCE | Attending: Surgery | Admitting: *Deleted

## 2011-08-20 ENCOUNTER — Encounter: Payer: Self-pay | Admitting: *Deleted

## 2011-08-20 DIAGNOSIS — Z9884 Bariatric surgery status: Secondary | ICD-10-CM

## 2011-08-20 DIAGNOSIS — Z09 Encounter for follow-up examination after completed treatment for conditions other than malignant neoplasm: Secondary | ICD-10-CM | POA: Insufficient documentation

## 2011-08-20 DIAGNOSIS — Z713 Dietary counseling and surveillance: Secondary | ICD-10-CM | POA: Insufficient documentation

## 2011-08-20 NOTE — Patient Instructions (Addendum)
Goals:  Follow Phase 4: Maintenance (High Protein, Low Fat/Carb)  Eat 3-6 small meals/snacks, every 3-5 hrs  Increase lean protein foods to meet 60-80g goal  Consume 15 grams of carbohydrate (fruit, whole grain, starchy vegetable) with meals  Increase fluid intake to 64oz +  Aim for >30 min of physical activity daily  F/U with MD on drainage from surgery, pain, and labs (prn)   

## 2011-08-20 NOTE — Progress Notes (Signed)
  Follow-up visit: 12 Months Post-Operative Gastric Bypass Surgery  Medical Nutrition Therapy:  Appt start time: 0832 end time:  0903.  Assessment:  Primary concerns today: post-operative bariatric surgery nutrition management. [name] reports that she recently had an abdominal surgery on 9/4 which is still giving her some pain and drainage. She is to have one more surgery per Dr. Daphine Deutscher. She continues to maintain/lose weight well and would like to reach a goal of about 160-165 lbs. Due to her poor appetite, her protein intake is down. Recommended increasing protein rich foods in diet and restarting protein supplements.  Weight today: 177 lbs Weight change: 9.5 lbs Total weight lost: 139.5 lbs total BMI: 29.5% Weight goal: 150-180lbs   24-hr recall:  B (7-8 AM): Sandwich "thin", egg, bacon, cheese sandwich (1/2) Snk (AM): crackers w/ cheese OR PB  L (12-1 PM): Mac and cheese (1/2 cup) w/ 2 oz pork chop Snk (PM): N/A  D (6-8 PM): Baked chicken (2oz), 1/2 cup collard greens Snk (PM): Special K Protein drink  Fluid intake: Crystal Light (48 oz), Unsweetened tea (16 oz) Estimated total protein intake: 40-50g  Medications: No changes Supplementation: Taking supplements regularly (B12, MVI, Calcium citrate)  Using straws: No Drinking while eating No Hair loss: No Carbonated beverages: No N/V/D/C: Constipation regularly, pt taking Miralax daily Dumping syndrome: No  Recent physical activity:  Pt is not exercising postoperative seroma surgery due to fear of "stitches coming out"  Progress Towards Goal(s):  Some progress.   Nutritional Diagnosis:  NI-5.7.1 Inadequate protein intake As related to small portion sizes and infrequent intake of protein rich foods.  As evidenced by pt meeting 55-60% of estimated protein needs.    Intervention:  Nutrition education.  Monitoring/Evaluation:  Dietary intake, exercise, lap band fills, and body weight. Follow up in 1 months, 30 minutes.

## 2011-08-22 ENCOUNTER — Ambulatory Visit (INDEPENDENT_AMBULATORY_CARE_PROVIDER_SITE_OTHER): Payer: PRIVATE HEALTH INSURANCE | Admitting: Surgery

## 2011-08-22 VITALS — BP 140/80 | HR 78 | Temp 98.2°F | Resp 16 | Ht 63.5 in | Wt 176.0 lb

## 2011-08-22 DIAGNOSIS — M793 Panniculitis, unspecified: Secondary | ICD-10-CM

## 2011-08-22 NOTE — Progress Notes (Signed)
[name] returns today.  [name] placed a percut drain in while I was away.  She looks good.  No fluid and minimal serous drainage.  Will see back in 4 weeks.  Drain out. On return visit she is going to want to talk about having a mons reduction.

## 2011-08-22 NOTE — Patient Instructions (Signed)
Continue to wear girdle

## 2011-08-25 ENCOUNTER — Encounter (INDEPENDENT_AMBULATORY_CARE_PROVIDER_SITE_OTHER): Payer: PRIVATE HEALTH INSURANCE | Admitting: Surgery

## 2011-09-18 ENCOUNTER — Encounter: Payer: PRIVATE HEALTH INSURANCE | Attending: Surgery | Admitting: *Deleted

## 2011-09-18 ENCOUNTER — Ambulatory Visit: Payer: PRIVATE HEALTH INSURANCE | Admitting: *Deleted

## 2011-09-18 ENCOUNTER — Ambulatory Visit (INDEPENDENT_AMBULATORY_CARE_PROVIDER_SITE_OTHER): Payer: PRIVATE HEALTH INSURANCE | Admitting: Surgery

## 2011-09-18 ENCOUNTER — Encounter: Payer: Self-pay | Admitting: *Deleted

## 2011-09-18 VITALS — BP 138/88 | HR 60 | Temp 96.1°F | Resp 18 | Ht 63.0 in | Wt 174.0 lb

## 2011-09-18 DIAGNOSIS — M793 Panniculitis, unspecified: Secondary | ICD-10-CM

## 2011-09-18 DIAGNOSIS — Z9884 Bariatric surgery status: Secondary | ICD-10-CM

## 2011-09-18 DIAGNOSIS — Z09 Encounter for follow-up examination after completed treatment for conditions other than malignant neoplasm: Secondary | ICD-10-CM | POA: Insufficient documentation

## 2011-09-18 DIAGNOSIS — Z713 Dietary counseling and surveillance: Secondary | ICD-10-CM | POA: Insufficient documentation

## 2011-09-18 NOTE — Patient Instructions (Signed)
Goals:  Follow Phase 4: High Protein, Low-Fat, Low Carbohydrate Diet  Eat 3-6 small meals/snacks, every 3-5 hrs  Increase lean protein foods to meet 60-80g goal  Increase fluid intake to 64oz +  Consume 1/2 cup or 15 grams of carbohydrate (fruit, whole grain, starchy vegetable) with meals  Avoid drinking 15 minutes before, during and 30 minutes after eating  Aim for >30 min of physical activity daily

## 2011-09-18 NOTE — Patient Instructions (Signed)
Stay on diet and exercise program

## 2011-09-18 NOTE — Progress Notes (Signed)
[name] 55 y.o.  Body mass index is 30.82 kg/(m^2). She has healed her panniculectomy done 07/15/11.  She has some redundant mons that she wants to have removed  Patient Active Problem List  Diagnoses  . S/P gastric bypass  . S/P abdominoplasty  . Panniculitis    Allergies  Allergen Reactions  . Morphine And Related Nausea Only  . Phenergan     Heart race     Past Surgical History  Procedure Date  . Shoulder surgery     x3  . Abdominal hysterectomy 1989  . Cardiac catheterization 2001  . Panniculectomy 07/15/11   [name],Jasmine A, MD No diagnosis found.  Doing well.  Return in January to discuss progress with resculpting Matt B. Daphine Deutscher, MD, [name]. [phone] beeper [phone]  09/18/2011 9:40 AM

## 2011-09-18 NOTE — Progress Notes (Signed)
  Follow-up visit: 13 Months Post-Operative Gastric Bypass Surgery  Medical Nutrition Therapy:  Appt start time: 0800 end time:  0830.  Assessment:  Primary concerns today: post-operative bariatric surgery nutrition management. [name] is doing much better this past month. She notes that her protein and fluid levels have improved and she is now reaching goal. She notes that she has more energy as well. She has an appointment to see Dr. Daphine Deutscher today to discuss an additional abdominal surgery.  Weight today: 173.2 lbs Weight change: 3.8 lbs Total weight lost: 143.3 lbs total (162.8 total from pts start wt) BMI: 28.9 Weight goal: 150-175 lbs % Weight goal met: 100%  24-hr recall:  B (AM): 1 egg, cheese, 1 small tortilla Snk (AM): 3 crackers, 2 oz cheese   L (PM): 1/2 cup macaroni and cheese, salmon (2 oz) Snk (PM): yogurt (4 oz)  D (PM): Corned beef (2 oz), green beans (1/2 cup) Snk (PM): Unjury protein shake  Fluid intake: 96 oz total (water, crystal light) Estimated total protein intake: 75-80g protein  Medications: No changes Supplementation: Taking regularly  Using straws: No Drinking while eating: No Hair loss: None reported Carbonated beverages: No N/V/D/C: None reported  Dumping syndrome: None reported  Recent physical activity:  Pt notes that she has increased her ADL's yet does not perform any structured activity.  Progress Towards Goal(s):  In progress.   Nutritional Diagnosis:  NB-2.1 Physical inactivity As related to lack of motivation or ability to exercise.  As evidenced by pt with limited energy expenditure.    Intervention:  Nutrition education.  Monitoring/Evaluation:  Dietary intake, exercise, lap band fills, and body weight. Follow up PRN.

## 2011-10-31 ENCOUNTER — Ambulatory Visit (INDEPENDENT_AMBULATORY_CARE_PROVIDER_SITE_OTHER): Payer: PRIVATE HEALTH INSURANCE | Admitting: Surgery

## 2011-10-31 ENCOUNTER — Encounter (INDEPENDENT_AMBULATORY_CARE_PROVIDER_SITE_OTHER): Payer: Self-pay | Admitting: Surgery

## 2011-10-31 VITALS — BP 152/90 | HR 82 | Temp 97.9°F | Resp 18 | Ht 64.0 in | Wt 174.5 lb

## 2011-10-31 DIAGNOSIS — Z9889 Other specified postprocedural states: Secondary | ICD-10-CM

## 2011-10-31 NOTE — Progress Notes (Signed)
[name] returns today in followup after her panniculectomy. Her incisions looked great and she looks very well good as well having lost 128 pounds. Her mons as noted hanging down and she wants to go into the getting it off. Its redundancy is pre-prominent. We'll wait and see about doing a mons ectomy. Will turned to [name]  for assistance in this.

## 2012-03-09 ENCOUNTER — Other Ambulatory Visit: Payer: Self-pay | Admitting: Unknown Physician Specialty

## 2012-03-09 DIAGNOSIS — Z1231 Encounter for screening mammogram for malignant neoplasm of breast: Secondary | ICD-10-CM

## 2012-04-01 ENCOUNTER — Ambulatory Visit
Admission: RE | Admit: 2012-04-01 | Discharge: 2012-04-01 | Disposition: A | Discharge status: Home / Self Care | Payer: PRIVATE HEALTH INSURANCE | Source: Ambulatory Visit | Attending: Unknown Physician Specialty | Admitting: Unknown Physician Specialty

## 2012-04-01 DIAGNOSIS — Z1231 Encounter for screening mammogram for malignant neoplasm of breast: Secondary | ICD-10-CM

## 2012-04-10 HISTORY — PX: BREAST BIOPSY: SHX20

## 2012-06-04 ENCOUNTER — Ambulatory Visit (INDEPENDENT_AMBULATORY_CARE_PROVIDER_SITE_OTHER): Payer: PRIVATE HEALTH INSURANCE | Admitting: Surgery

## 2012-06-04 ENCOUNTER — Encounter (INDEPENDENT_AMBULATORY_CARE_PROVIDER_SITE_OTHER): Payer: Self-pay | Admitting: Surgery

## 2012-06-04 VITALS — BP 132/70 | HR 72 | Temp 97.6°F | Resp 16 | Ht 65.0 in | Wt 176.5 lb

## 2012-06-04 DIAGNOSIS — M793 Panniculitis, unspecified: Secondary | ICD-10-CM

## 2012-06-04 NOTE — Patient Instructions (Signed)
Continue exercise  Continue to treat and try to prevent panniculitis

## 2012-06-04 NOTE — Progress Notes (Signed)
[name] 56 y.o.  Body mass index is 29.37 kg/(m^2).  Patient Active Problem List  Diagnosis  . S/P gastric bypass  . S/P abdominoplasty  . Panniculitis -effecting mons    Allergies  Allergen Reactions  . Morphine And Related Nausea Only  . Promethazine Hcl     Heart race     Past Surgical History  Procedure Date  . Shoulder surgery     x3  . Abdominal hysterectomy 1989  . Cardiac catheterization 2001  . Panniculectomy 07/15/11   HASANAJ,XAJE A, MD 1. Panniculitis     Comes in today complaining of irritation of mons pannus.  [name] is using various creams and had a note from her primary medical doctor regarding her irritation down in her mons region. He'll panniculectomy has done very well. I will see if we can get her proved to do a reduction of her mons. I don't think it that's going to help her urinary continence however. We'll take some pictures today and see where we can get with trying to get this approved by her insurance. It's an issue if it empties her ability to exercise and if he continues to break down. [name]'s done very well after her bypass and looks and feels very good. Plan return 2 months Matt B. Daphine Deutscher, MD, [facility] [phone] beeper [phone]  06/04/2012 12:28 PM

## 2012-08-30 DIAGNOSIS — R0789 Other chest pain: Secondary | ICD-10-CM | POA: Insufficient documentation

## 2012-08-30 DIAGNOSIS — R079 Chest pain, unspecified: Secondary | ICD-10-CM | POA: Insufficient documentation

## 2012-09-29 ENCOUNTER — Emergency Department (HOSPITAL_COMMUNITY): Payer: PRIVATE HEALTH INSURANCE

## 2012-09-29 ENCOUNTER — Encounter (HOSPITAL_COMMUNITY): Payer: Self-pay | Admitting: *Deleted

## 2012-09-29 ENCOUNTER — Inpatient Hospital Stay (HOSPITAL_COMMUNITY)
Admission: EM | Admit: 2012-09-29 | Discharge: 2012-10-01 | DRG: 494 | Disposition: A | Discharge status: Home / Self Care | Payer: PRIVATE HEALTH INSURANCE | Attending: Emergency Medicine | Admitting: Emergency Medicine

## 2012-09-29 ENCOUNTER — Emergency Department (HOSPITAL_COMMUNITY): Payer: PRIVATE HEALTH INSURANCE | Admitting: *Deleted

## 2012-09-29 ENCOUNTER — Encounter (HOSPITAL_COMMUNITY): Payer: Self-pay | Admitting: Emergency Medicine

## 2012-09-29 ENCOUNTER — Encounter (HOSPITAL_COMMUNITY): Payer: Self-pay | Admitting: General Practice

## 2012-09-29 ENCOUNTER — Encounter (HOSPITAL_COMMUNITY): Admission: EM | Disposition: A | Discharge status: Home / Self Care | Payer: Self-pay | Source: Home / Self Care

## 2012-09-29 DIAGNOSIS — S82843B Displaced bimalleolar fracture of unspecified lower leg, initial encounter for open fracture type I or II: Principal | ICD-10-CM | POA: Diagnosis present

## 2012-09-29 DIAGNOSIS — K219 Gastro-esophageal reflux disease without esophagitis: Secondary | ICD-10-CM | POA: Diagnosis present

## 2012-09-29 DIAGNOSIS — Z860100 Personal history of colon polyps, unspecified: Secondary | ICD-10-CM

## 2012-09-29 DIAGNOSIS — S82899B Other fracture of unspecified lower leg, initial encounter for open fracture type I or II: Secondary | ICD-10-CM

## 2012-09-29 DIAGNOSIS — I10 Essential (primary) hypertension: Secondary | ICD-10-CM | POA: Diagnosis present

## 2012-09-29 DIAGNOSIS — Z23 Encounter for immunization: Secondary | ICD-10-CM

## 2012-09-29 DIAGNOSIS — S82891B Other fracture of right lower leg, initial encounter for open fracture type I or II: Secondary | ICD-10-CM

## 2012-09-29 HISTORY — DX: Anxiety disorder, unspecified: F41.9

## 2012-09-29 HISTORY — PX: ORIF ANKLE FRACTURE: SHX5408

## 2012-09-29 HISTORY — DX: Unspecified osteoarthritis, unspecified site: M19.90

## 2012-09-29 HISTORY — PX: ORIF ANKLE FRACTURE: SUR919

## 2012-09-29 HISTORY — PX: I&D EXTREMITY: SHX5045

## 2012-09-29 HISTORY — DX: Acute embolism and thrombosis of unspecified deep veins of unspecified lower extremity: I82.409

## 2012-09-29 LAB — COMPREHENSIVE METABOLIC PANEL
AST: 275 U/L — ABNORMAL HIGH (ref 0–37)
BUN: 14 mg/dL (ref 6–23)
CO2: 27 mEq/L (ref 19–32)
Calcium: 9.3 mg/dL (ref 8.4–10.5)
Chloride: 106 mEq/L (ref 96–112)
Creatinine, Ser: 0.92 mg/dL (ref 0.50–1.10)
GFR calc Af Amer: 79 mL/min — ABNORMAL LOW (ref >90)
GFR calc non Af Amer: 68 mL/min — ABNORMAL LOW (ref >90)
Glucose, Bld: 120 mg/dL — ABNORMAL HIGH (ref 70–99)
Total Bilirubin: 0.4 mg/dL (ref 0.3–1.2)

## 2012-09-29 LAB — CBC WITH DIFFERENTIAL/PLATELET
Basophils Absolute: 0 10*3/uL (ref 0.0–0.1)
Eosinophils Relative: 3 % (ref 0–5)
HCT: 37.7 % (ref 36.0–46.0)
Hemoglobin: 12.8 g/dL (ref 12.0–15.0)
Lymphocytes Relative: 32 % (ref 12–46)
Lymphs Abs: 2 10*3/uL (ref 0.7–4.0)
MCV: 84.5 fL (ref 78.0–100.0)
Monocytes Absolute: 0.3 10*3/uL (ref 0.1–1.0)
Monocytes Relative: 6 % (ref 3–12)
Neutro Abs: 3.6 10*3/uL (ref 1.7–7.7)
RDW: 12.7 % (ref 11.5–15.5)
WBC: 6.1 10*3/uL (ref 4.0–10.5)

## 2012-09-29 LAB — POCT I-STAT 3, VENOUS BLOOD GAS (G3P V)
O2 Saturation: 65 %
pCO2, Ven: 44.1 mmHg — ABNORMAL LOW (ref 45.0–50.0)
pH, Ven: 7.394 — ABNORMAL HIGH (ref 7.250–7.300)

## 2012-09-29 LAB — TYPE AND SCREEN: ABO/RH(D): A POS

## 2012-09-29 LAB — POCT I-STAT, CHEM 8
Calcium, Ion: 1.27 mmol/L — ABNORMAL HIGH (ref 1.12–1.23)
Hemoglobin: 13.3 g/dL (ref 12.0–15.0)
Sodium: 145 mEq/L (ref 135–145)
TCO2: 25 mmol/L (ref 0–100)

## 2012-09-29 LAB — PROTIME-INR: INR: 1 (ref 0.00–1.49)

## 2012-09-29 LAB — TROPONIN I: Troponin I: <0.3 ng/mL (ref <0.30)

## 2012-09-29 SURGERY — OPEN REDUCTION INTERNAL FIXATION (ORIF) ANKLE FRACTURE
Anesthesia: General | Site: Ankle | Laterality: Right | Wound class: Clean Contaminated

## 2012-09-29 MED ORDER — OXYCODONE HCL 5 MG PO TABS: 5.0000 mg | ORAL_TABLET | Freq: Once | ORAL | Status: DC | PRN

## 2012-09-29 MED ORDER — POTASSIUM CHLORIDE 10 MEQ/100ML IV SOLN: 10.0000 meq | INTRAVENOUS | Status: DC

## 2012-09-29 MED ORDER — ONDANSETRON HCL 4 MG/2ML IJ SOLN
INTRAMUSCULAR | Status: DC | PRN
  Administered 2012-09-29: 4 mg via INTRAVENOUS

## 2012-09-29 MED ORDER — FENTANYL CITRATE 0.05 MG/ML IJ SOLN
INTRAMUSCULAR | Status: AC | PRN
  Administered 2012-09-29: 50 ug via INTRAVENOUS

## 2012-09-29 MED ORDER — IOHEXOL 300 MG/ML  SOLN
100.0000 mL | Freq: Once | INTRAMUSCULAR | Status: AC | PRN
  Administered 2012-09-29: 100 mL via INTRAVENOUS

## 2012-09-29 MED ORDER — METHOCARBAMOL 500 MG PO TABS
500.0000 mg | ORAL_TABLET | Freq: Four times a day (QID) | ORAL | Status: DC | PRN
  Administered 2012-09-30: 500 mg via ORAL
  Filled 2012-09-29: qty 1

## 2012-09-29 MED ORDER — DEXAMETHASONE SODIUM PHOSPHATE 4 MG/ML IJ SOLN
INTRAMUSCULAR | Status: DC | PRN
  Administered 2012-09-29: 4 mg via INTRAVENOUS

## 2012-09-29 MED ORDER — FENTANYL CITRATE 0.05 MG/ML IJ SOLN: 50.0000 ug | INTRAMUSCULAR | Status: DC | PRN

## 2012-09-29 MED ORDER — ONDANSETRON HCL 4 MG/2ML IJ SOLN: 4.0000 mg | Freq: Four times a day (QID) | INTRAMUSCULAR | Status: DC | PRN

## 2012-09-29 MED ORDER — HYDROMORPHONE HCL PF 1 MG/ML IJ SOLN
1.0000 mg | INTRAMUSCULAR | Status: DC | PRN
  Administered 2012-09-29: 1 mg via INTRAVENOUS
  Filled 2012-09-29: qty 1

## 2012-09-29 MED ORDER — ONDANSETRON HCL 4 MG/2ML IJ SOLN
INTRAMUSCULAR | Status: AC
  Filled 2012-09-29: qty 2

## 2012-09-29 MED ORDER — METOCLOPRAMIDE HCL 10 MG PO TABS: 5.0000 mg | ORAL_TABLET | Freq: Three times a day (TID) | ORAL | Status: DC | PRN

## 2012-09-29 MED ORDER — ONDANSETRON HCL 4 MG/2ML IJ SOLN
4.0000 mg | Freq: Once | INTRAMUSCULAR | Status: AC
  Administered 2012-09-29: 4 mg via INTRAVENOUS

## 2012-09-29 MED ORDER — TETANUS-DIPHTHERIA TOXOIDS TD 5-2 LFU IM INJ
0.5000 mL | INJECTION | Freq: Once | INTRAMUSCULAR | Status: AC
  Administered 2012-09-29: 0.5 mL via INTRAMUSCULAR
  Filled 2012-09-29: qty 0.5

## 2012-09-29 MED ORDER — PROPOFOL 10 MG/ML IV BOLUS
INTRAVENOUS | Status: DC | PRN
  Administered 2012-09-29: 160 mg via INTRAVENOUS

## 2012-09-29 MED ORDER — ALPRAZOLAM 0.5 MG PO TABS: 1.0000 mg | ORAL_TABLET | Freq: Every evening | ORAL | Status: DC | PRN

## 2012-09-29 MED ORDER — FENTANYL CITRATE 0.05 MG/ML IJ SOLN
INTRAMUSCULAR | Status: AC
  Filled 2012-09-29: qty 2

## 2012-09-29 MED ORDER — DOCUSATE SODIUM 100 MG PO CAPS
100.0000 mg | ORAL_CAPSULE | Freq: Two times a day (BID) | ORAL | Status: DC
  Administered 2012-09-30 – 2012-10-01 (×4): 100 mg via ORAL
  Filled 2012-09-29 (×6): qty 1

## 2012-09-29 MED ORDER — HYDROCODONE-ACETAMINOPHEN 5-325 MG PO TABS
1.0000 | ORAL_TABLET | ORAL | Status: DC | PRN
  Administered 2012-09-30 (×2): 1 via ORAL
  Filled 2012-09-29 (×2): qty 1

## 2012-09-29 MED ORDER — MIDAZOLAM HCL 2 MG/2ML IJ SOLN
INTRAMUSCULAR | Status: AC
  Filled 2012-09-29: qty 2

## 2012-09-29 MED ORDER — OXYCODONE HCL 5 MG PO TABS
5.0000 mg | ORAL_TABLET | ORAL | Status: DC | PRN
  Administered 2012-09-30 – 2012-10-01 (×4): 5 mg via ORAL
  Filled 2012-09-29 (×4): qty 1

## 2012-09-29 MED ORDER — LACTATED RINGERS IV SOLN
INTRAVENOUS | Status: DC | PRN
  Administered 2012-09-29 (×2): via INTRAVENOUS

## 2012-09-29 MED ORDER — METHOCARBAMOL 100 MG/ML IJ SOLN
500.0000 mg | Freq: Four times a day (QID) | INTRAVENOUS | Status: DC | PRN
  Filled 2012-09-29: qty 5

## 2012-09-29 MED ORDER — ASPIRIN EC 325 MG PO TBEC
325.0000 mg | DELAYED_RELEASE_TABLET | Freq: Every day | ORAL | Status: DC
  Administered 2012-09-30 – 2012-10-01 (×3): 325 mg via ORAL
  Filled 2012-09-29 (×4): qty 1

## 2012-09-29 MED ORDER — MIDAZOLAM HCL 2 MG/2ML IJ SOLN: 1.0000 mg | INTRAMUSCULAR | Status: DC | PRN

## 2012-09-29 MED ORDER — INFLUENZA VIRUS VACC SPLIT PF IM SUSP
0.5000 mL | INTRAMUSCULAR | Status: AC
  Administered 2012-09-30: 0.5 mL via INTRAMUSCULAR
  Filled 2012-09-29: qty 0.5

## 2012-09-29 MED ORDER — HYDROMORPHONE HCL PF 1 MG/ML IJ SOLN: 0.2500 mg | INTRAMUSCULAR | Status: DC | PRN

## 2012-09-29 MED ORDER — METOCLOPRAMIDE HCL 5 MG/ML IJ SOLN: 5.0000 mg | Freq: Three times a day (TID) | INTRAMUSCULAR | Status: DC | PRN

## 2012-09-29 MED ORDER — ONDANSETRON HCL 4 MG PO TABS: 4.0000 mg | ORAL_TABLET | Freq: Four times a day (QID) | ORAL | Status: DC | PRN

## 2012-09-29 MED ORDER — CEFAZOLIN SODIUM 1-5 GM-% IV SOLN
1.0000 g | Freq: Once | INTRAVENOUS | Status: AC
  Administered 2012-09-29: 1 g via INTRAVENOUS
  Filled 2012-09-29: qty 50

## 2012-09-29 MED ORDER — CLINDAMYCIN PHOSPHATE 600 MG/50ML IV SOLN
INTRAVENOUS | Status: AC
  Filled 2012-09-29: qty 100

## 2012-09-29 MED ORDER — LACTATED RINGERS IV SOLN
INTRAVENOUS | Status: DC
  Administered 2012-09-29: 16:00:00 via INTRAVENOUS

## 2012-09-29 MED ORDER — DOXEPIN HCL 50 MG PO CAPS: 50.0000 mg | ORAL_CAPSULE | Freq: Every day | ORAL | Status: DC

## 2012-09-29 MED ORDER — ONDANSETRON HCL 4 MG/2ML IJ SOLN
4.0000 mg | Freq: Four times a day (QID) | INTRAMUSCULAR | Status: DC | PRN
  Administered 2012-09-29: 4 mg via INTRAVENOUS
  Filled 2012-09-29: qty 2

## 2012-09-29 MED ORDER — SODIUM CHLORIDE 0.9 % IV SOLN
INTRAVENOUS | Status: DC
  Administered 2012-09-30: 20 mL/h via INTRAVENOUS

## 2012-09-29 MED ORDER — ZOLPIDEM TARTRATE 5 MG PO TABS: 5.0000 mg | ORAL_TABLET | Freq: Every evening | ORAL | Status: DC | PRN

## 2012-09-29 MED ORDER — OXYCODONE HCL 5 MG/5ML PO SOLN: 5.0000 mg | Freq: Once | ORAL | Status: DC | PRN

## 2012-09-29 MED ORDER — LIDOCAINE HCL (CARDIAC) 20 MG/ML IV SOLN
INTRAVENOUS | Status: DC | PRN
  Administered 2012-09-29: 80 mg via INTRAVENOUS

## 2012-09-29 MED ORDER — SUCCINYLCHOLINE CHLORIDE 20 MG/ML IJ SOLN
INTRAMUSCULAR | Status: DC | PRN
  Administered 2012-09-29: 100 mg via INTRAVENOUS

## 2012-09-29 MED ORDER — DIPHENHYDRAMINE HCL 12.5 MG/5ML PO ELIX: 12.5000 mg | ORAL_SOLUTION | ORAL | Status: DC | PRN

## 2012-09-29 MED ORDER — BUPIVACAINE-EPINEPHRINE PF 0.5-1:200000 % IJ SOLN
INTRAMUSCULAR | Status: DC | PRN
  Administered 2012-09-29: 30 mL

## 2012-09-29 MED ORDER — CLINDAMYCIN PHOSPHATE 900 MG/50ML IV SOLN
900.0000 mg | INTRAVENOUS | Status: AC
  Administered 2012-09-29: 900 mg via INTRAVENOUS
  Filled 2012-09-29: qty 50

## 2012-09-29 MED ORDER — SODIUM CHLORIDE 0.9 % IR SOLN
Status: DC | PRN
  Administered 2012-09-29: 3000 mL

## 2012-09-29 MED ORDER — DEXTROSE 5 % IV SOLN
INTRAVENOUS | Status: DC | PRN
  Administered 2012-09-29: 18:00:00 via INTRAVENOUS

## 2012-09-29 MED ORDER — ESCITALOPRAM OXALATE 10 MG PO TABS: 10.0000 mg | ORAL_TABLET | Freq: Every day | ORAL | Status: DC

## 2012-09-29 MED ORDER — FENTANYL CITRATE 0.05 MG/ML IJ SOLN
INTRAMUSCULAR | Status: DC | PRN
  Administered 2012-09-29: 75 ug via INTRAVENOUS
  Administered 2012-09-29: 25 ug via INTRAVENOUS

## 2012-09-29 MED ORDER — CEFAZOLIN SODIUM-DEXTROSE 2-3 GM-% IV SOLR
2.0000 g | Freq: Four times a day (QID) | INTRAVENOUS | Status: AC
Start: 2012-09-29 — End: 2012-09-30
  Administered 2012-09-29 – 2012-09-30 (×3): 2 g via INTRAVENOUS
  Filled 2012-09-29 (×3): qty 50

## 2012-09-29 SURGICAL SUPPLY — 81 items
BANDAGE CONFORM 3  STR LF (GAUZE/BANDAGES/DRESSINGS) IMPLANT
BANDAGE ELASTIC 3 VELCRO ST LF (GAUZE/BANDAGES/DRESSINGS) IMPLANT
BANDAGE ELASTIC 4 VELCRO ST LF (GAUZE/BANDAGES/DRESSINGS) IMPLANT
BANDAGE ELASTIC 6 VELCRO ST LF (GAUZE/BANDAGES/DRESSINGS) ×4 IMPLANT
BANDAGE ESMARK 6X9 LF (GAUZE/BANDAGES/DRESSINGS) ×1 IMPLANT
BIT DRILL 2.9 CANN QC NONSTRL (BIT) ×4 IMPLANT
BLADE SURG 10 STRL SS (BLADE) ×2 IMPLANT
BNDG COHESIVE 1X5 TAN STRL LF (GAUZE/BANDAGES/DRESSINGS) IMPLANT
BNDG COHESIVE 4X5 TAN STRL (GAUZE/BANDAGES/DRESSINGS) IMPLANT
BNDG COHESIVE 6X5 TAN STRL LF (GAUZE/BANDAGES/DRESSINGS) IMPLANT
BNDG ESMARK 6X9 LF (GAUZE/BANDAGES/DRESSINGS) ×2
BNDG GAUZE STRTCH 6 (GAUZE/BANDAGES/DRESSINGS) ×6 IMPLANT
CLOTH BEACON ORANGE TIMEOUT ST (SAFETY) ×2 IMPLANT
CORDS BIPOLAR (ELECTRODE) IMPLANT
COVER SURGICAL LIGHT HANDLE (MISCELLANEOUS) ×2 IMPLANT
CUFF TOURNIQUET SINGLE 18IN (TOURNIQUET CUFF) IMPLANT
CUFF TOURNIQUET SINGLE 24IN (TOURNIQUET CUFF) IMPLANT
CUFF TOURNIQUET SINGLE 34IN LL (TOURNIQUET CUFF) ×2 IMPLANT
CUFF TOURNIQUET SINGLE 44IN (TOURNIQUET CUFF) IMPLANT
DRAPE C-ARM 42X72 X-RAY (DRAPES) ×4 IMPLANT
DRAPE ORTHO SPLIT 77X108 STRL (DRAPES)
DRAPE PROXIMA HALF (DRAPES) IMPLANT
DRAPE SURG 17X23 STRL (DRAPES) IMPLANT
DRAPE SURG ORHT 6 SPLT 77X108 (DRAPES) IMPLANT
DRAPE U-SHAPE 47X51 STRL (DRAPES) ×2 IMPLANT
DURAPREP 26ML APPLICATOR (WOUND CARE) IMPLANT
ELECT CAUTERY BLADE 6.4 (BLADE) ×2 IMPLANT
ELECT REM PT RETURN 9FT ADLT (ELECTROSURGICAL) ×2
ELECTRODE REM PT RTRN 9FT ADLT (ELECTROSURGICAL) ×1 IMPLANT
GAUZE XEROFORM 1X8 LF (GAUZE/BANDAGES/DRESSINGS) ×2 IMPLANT
GAUZE XEROFORM 5X9 LF (GAUZE/BANDAGES/DRESSINGS) IMPLANT
GLOVE BIOGEL PI IND STRL 8 (GLOVE) ×1 IMPLANT
GLOVE BIOGEL PI INDICATOR 8 (GLOVE) ×1
GLOVE ORTHO TXT STRL SZ7.5 (GLOVE) ×2 IMPLANT
GOWN PREVENTION PLUS LG XLONG (DISPOSABLE) IMPLANT
GOWN PREVENTION PLUS XLARGE (GOWN DISPOSABLE) ×2 IMPLANT
GOWN STRL NON-REIN LRG LVL3 (GOWN DISPOSABLE) ×2 IMPLANT
HANDPIECE INTERPULSE COAX TIP (DISPOSABLE) ×1
K-WIRE ACE 1.6X6 (WIRE) ×4
KIT BASIN OR (CUSTOM PROCEDURE TRAY) ×2 IMPLANT
KIT ROOM TURNOVER OR (KITS) ×2 IMPLANT
KWIRE ACE 1.6X6 (WIRE) ×2 IMPLANT
MANIFOLD NEPTUNE II (INSTRUMENTS) ×2 IMPLANT
NEEDLE HYPO 25GX1X1/2 BEV (NEEDLE) ×2 IMPLANT
NS IRRIG 1000ML POUR BTL (IV SOLUTION) ×2 IMPLANT
PACK ORTHO EXTREMITY (CUSTOM PROCEDURE TRAY) ×2 IMPLANT
PAD ARMBOARD 7.5X6 YLW CONV (MISCELLANEOUS) ×4 IMPLANT
PAD CAST 4YDX4 CTTN HI CHSV (CAST SUPPLIES) IMPLANT
PADDING CAST ABS 4INX4YD NS (CAST SUPPLIES) ×1
PADDING CAST ABS 6INX4YD NS (CAST SUPPLIES) ×1
PADDING CAST ABS COTTON 4X4 ST (CAST SUPPLIES) ×1 IMPLANT
PADDING CAST ABS COTTON 6X4 NS (CAST SUPPLIES) ×1 IMPLANT
PADDING CAST COTTON 4X4 STRL (CAST SUPPLIES)
PADDING CAST COTTON 6X4 STRL (CAST SUPPLIES) ×2 IMPLANT
PREFILTER NEPTUNE (MISCELLANEOUS) ×2 IMPLANT
SCREW ACE CAN 4.0 36M (Screw) ×2 IMPLANT
SCREW ACE CAN 4.0 44M (Screw) ×2 IMPLANT
SET HNDPC FAN SPRY TIP SCT (DISPOSABLE) ×1 IMPLANT
SPLINT PLASTER CAST XFAST 5X30 (CAST SUPPLIES) ×1 IMPLANT
SPLINT PLASTER XFAST SET 5X30 (CAST SUPPLIES) ×1
SPONGE GAUZE 4X4 12PLY (GAUZE/BANDAGES/DRESSINGS) ×2 IMPLANT
SPONGE LAP 18X18 X RAY DECT (DISPOSABLE) ×2 IMPLANT
SPONGE LAP 4X18 X RAY DECT (DISPOSABLE) IMPLANT
STOCKINETTE IMPERVIOUS 9X36 MD (GAUZE/BANDAGES/DRESSINGS) ×2 IMPLANT
SUCTION FRAZIER TIP 10 FR DISP (SUCTIONS) ×2 IMPLANT
SUT ETHILON 2 0 FS 18 (SUTURE) ×6 IMPLANT
SUT ETHILON 3 0 PS 1 (SUTURE) IMPLANT
SUT VIC AB 1 CT1 27 (SUTURE) ×1
SUT VIC AB 1 CT1 27XBRD ANBCTR (SUTURE) ×1 IMPLANT
SUT VIC AB 2-0 CT1 27 (SUTURE)
SUT VIC AB 2-0 CT1 27XBRD (SUTURE) IMPLANT
SUT VICRYL 0 CT 1 36IN (SUTURE) ×2 IMPLANT
SYR CONTROL 10ML LL (SYRINGE) IMPLANT
TOWEL OR 17X24 6PK STRL BLUE (TOWEL DISPOSABLE) ×2 IMPLANT
TOWEL OR 17X26 10 PK STRL BLUE (TOWEL DISPOSABLE) ×2 IMPLANT
TUBE ANAEROBIC SPECIMEN COL (MISCELLANEOUS) IMPLANT
TUBE CONNECTING 12X1/4 (SUCTIONS) ×2 IMPLANT
TUBE FEEDING 5FR 15 INCH (TUBING) IMPLANT
UNDERPAD 30X30 INCONTINENT (UNDERPADS AND DIAPERS) ×2 IMPLANT
WATER STERILE IRR 1000ML POUR (IV SOLUTION) IMPLANT
YANKAUER SUCT BULB TIP NO VENT (SUCTIONS) ×2 IMPLANT

## 2012-09-29 NOTE — ED Notes (Signed)
Son-(roger) returned phone call - notified of mother's condition.
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
## 2012-09-29 NOTE — ED Notes (Signed)
Notified daughter== (Tammy Pannel) @ [phone], (her work) -- will come to hospital.  Left msg with Shaliyah Taite- (husband) at [phone]

## 2012-09-29 NOTE — Anesthesia Procedure Notes (Signed)
Anesthesia Regional Block:  Popliteal block  Pre-Anesthetic Checklist: ,, timeout performed, Correct Patient, Correct Site, Correct Laterality, Correct Procedure, Correct Position, site marked, Risks and benefits discussed,  Surgical consent,  Pre-op evaluation,  At surgeon's request and post-op pain management  Laterality: Right  Prep: chloraprep       Needles:  Injection technique: Single-shot  Needle Type: Echogenic Stimulator Needle     Needle Length:cm 9 cm Needle Gauge: 21 G    Additional Needles:  Procedures: ultrasound guided (picture in chart) and nerve stimulator Popliteal block  Nerve Stimulator or Paresthesia:  Response: plantar flexion of foot, 0.45 mA,   Additional Responses:   Narrative:  Start time: 09/29/2012 5:10 PM End time: 09/29/2012 5:27 PM Injection made incrementally with aspirations every 5 mL.  Performed by: Personally  Anesthesiologist: Dr Chaney Malling  Additional Notes: Functioning IV was confirmed and monitors were applied.  A 90mm 21ga Arrow echogenic stimulator needle was used. Sterile prep and drape,hand hygiene and sterile gloves were used.  Negative aspiration and negative test dose prior to incremental administration of local anesthetic. The patient tolerated the procedure well.  Ultrasound guidance: relevent anatomy identified, needle position confirmed, local anesthetic spread visualized around nerve(s), vascular puncture avoided.  Image printed for medical record.   Popliteal block

## 2012-09-29 NOTE — ED Notes (Signed)
Abnormal blood gas results delivered to Dr. Rhunette Croft.

## 2012-09-29 NOTE — ED Notes (Signed)
Pt remains in radiology 

## 2012-09-29 NOTE — Brief Op Note (Signed)
09/29/2012  7:57 PM  PATIENT:  [name]  56 y.o. female  PRE-OPERATIVE DIAGNOSIS:  open ankle fracture r  POST-OPERATIVE DIAGNOSIS:  Right Open Ankle Fracture  PROCEDURE:  Procedure(s) (LRB) with comments: IRRIGATION AND DEBRIDEMENT EXTREMITY (Right) OPEN REDUCTION INTERNAL FIXATION (ORIF) ANKLE FRACTURE (Right)  SURGEON:  Surgeon(s) and Role:    * Kathryne Hitch, MD - Primary  PHYSICIAN ASSISTANT:   ASSISTANTS: none   ANESTHESIA:   regional and general  EBL:  Total I/O In: 250 [I.V.:250] Out: -   BLOOD ADMINISTERED:none  DRAINS: none   LOCAL MEDICATIONS USED:  NONE  SPECIMEN:  No Specimen  DISPOSITION OF SPECIMEN:  N/A  COUNTS:  YES  TOURNIQUET:   Total Tourniquet Time Documented: Thigh (Right) - 32 minutes  DICTATION: .Other Dictation: Dictation Number [id number]  PLAN OF CARE: Admit to inpatient   PATIENT DISPOSITION:  PACU - hemodynamically stable.   Delay start of Pharmacological VTE agent (>24hrs) due to surgical blood loss or risk of bleeding: no

## 2012-09-29 NOTE — Transfer of Care (Signed)
Immediate Anesthesia Transfer of Care Note  Patient: [name]  Procedure(s) Performed: Procedure(s) (LRB) with comments: IRRIGATION AND DEBRIDEMENT EXTREMITY (Right) OPEN REDUCTION INTERNAL FIXATION (ORIF) ANKLE FRACTURE (Right)  Patient Location: PACU  Anesthesia Type:General and MAC combined with regional for post-op pain  Level of Consciousness: awake, alert  and oriented  Airway & Oxygen Therapy: Patient Spontanous Breathing and Patient connected to nasal cannula oxygen  Post-op Assessment: Report given to PACU RN and Post -op Vital signs reviewed and stable  Post vital signs: Reviewed and stable  Complications: No apparent anesthesia complications
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
## 2012-09-29 NOTE — Anesthesia Postprocedure Evaluation (Signed)
  Anesthesia Post-op Note  Patient: [name]  Procedure(s) Performed: Procedure(s) (LRB) with comments: IRRIGATION AND DEBRIDEMENT EXTREMITY (Right) OPEN REDUCTION INTERNAL FIXATION (ORIF) ANKLE FRACTURE (Right)  Patient Location: PACU  Anesthesia Type:General  Level of Consciousness: awake  Airway and Oxygen Therapy: Patient Spontanous Breathing  Post-op Pain: mild  Post-op Assessment: Post-op Vital signs reviewed  Post-op Vital Signs: stable  Complications: No apparent anesthesia complications

## 2012-09-29 NOTE — Preoperative (Signed)
Beta Blockers   Reason not to administer Beta Blockers:Not Applicable 

## 2012-09-29 NOTE — ED Notes (Signed)
Called to check on the patient in radiology.  Pt remains A/O.

## 2012-09-29 NOTE — Anesthesia Preprocedure Evaluation (Signed)
Anesthesia Evaluation  Patient identified by MRN, date of birth, ID band Patient awake    Reviewed: Allergy & Precautions, H&P , NPO status , Patient's Chart, lab work & pertinent test results  Airway Mallampati: II  Neck ROM: full    Dental   Pulmonary          Cardiovascular hypertension,     Neuro/Psych    GI/Hepatic GERD-  ,  Endo/Other    Renal/GU      Musculoskeletal   Abdominal   Peds  Hematology   Anesthesia Other Findings   Reproductive/Obstetrics                           Anesthesia Physical Anesthesia Plan  ASA: II  Anesthesia Plan: General and Regional   Post-op Pain Management: MAC Combined w/ Regional for Post-op pain   Induction: Intravenous  Airway Management Planned: LMA  Additional Equipment:   Intra-op Plan:   Post-operative Plan:   Informed Consent: I have reviewed the patients History and Physical, chart, labs and discussed the procedure including the risks, benefits and alternatives for the proposed anesthesia with the patient or authorized representative who has indicated his/her understanding and acceptance.     Plan Discussed with: CRNA and Surgeon  Anesthesia Plan Comments:         Anesthesia Quick Evaluation  

## 2012-09-29 NOTE — Progress Notes (Signed)
Orthopedic Tech Progress Note Patient Details:  Maycel Riffe [employer] 04/17/1956 [id number]  Ortho Devices Type of Ortho Device: Short leg splint;Stirrup splint Ortho Device/Splint Location: RIGHT SHORT LEG SPLINT WITH STIRRUP  Ortho Device/Splint Interventions: Application   Cammer, Mickie Bail 09/29/2012, 1:35 PM

## 2012-09-30 ENCOUNTER — Encounter (HOSPITAL_COMMUNITY): Payer: Self-pay | Admitting: Orthopaedic Surgery

## 2012-09-30 LAB — CBC
HCT: 35.2 % — ABNORMAL LOW (ref 36.0–46.0)
Hemoglobin: 11.7 g/dL — ABNORMAL LOW (ref 12.0–15.0)
MCH: 28.5 pg (ref 26.0–34.0)
MCV: 85.6 fL (ref 78.0–100.0)
RBC: 4.11 MIL/uL (ref 3.87–5.11)
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
## 2012-09-30 NOTE — Progress Notes (Signed)
Utilization review completed. Shuayb Schepers, RN, BSN. 
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
## 2012-09-30 NOTE — Progress Notes (Signed)
Subjective: 1 Day Post-Op Procedure(s) (LRB): IRRIGATION AND DEBRIDEMENT EXTREMITY (Right) OPEN REDUCTION INTERNAL FIXATION (ORIF) ANKLE FRACTURE (Right) Patient reports pain as moderate.  Reports pain to the left of sternum.  Denies deep CP.  Denies SOB.  Objective: Vital signs in last 24 hours: Temp:  [97.6 F (36.4 C)-98.4 F (36.9 C)] 98.4 F (36.9 C) (11/21 0600) Pulse Rate:  [70-99] 80  (11/21 0600) Resp:  [16-20] 16  (11/21 0600) BP: (116-188)/(49-102) 123/66 mmHg (11/21 0600) SpO2:  [93 %-100 %] 97 % (11/21 0600) Weight:  [82.555 kg (182 lb)] 82.555 kg (182 lb) (11/20 2110)  Intake/Output from previous day: 11/20 0701 - 11/21 0700 In: 1325 [I.V.:1325] Out: -  Intake/Output this shift: Total I/O In: 250 [I.V.:250] Out: -    Basename 09/29/12 1245 09/29/12 1234  HGB 13.3 12.8    Basename 09/29/12 1245 09/29/12 1234  WBC -- 6.1  RBC -- 4.46  HCT 39.0 37.7  PLT -- 194    Basename 09/29/12 1245 09/29/12 1234  NA 145 142  K 3.4* 3.3*  CL 107 106  CO2 -- 27  BUN 14 14  CREATININE 1.00 0.92  GLUCOSE 116* 120*  CALCIUM -- 9.3    Basename 09/29/12 1234  LABPT --  INR 1.00    Sensation intact distally Intact pulses distally Incision: dressing C/D/I  Assessment/Plan: 1 Day Post-Op Procedure(s) (LRB): IRRIGATION AND DEBRIDEMENT EXTREMITY (Right) OPEN REDUCTION INTERNAL FIXATION (ORIF) ANKLE FRACTURE (Right) Up with therapy, NWB right ankle. Discharge when clears PT.  Will need to be here today.  BLACKMAN,CHRISTOPHER Y 09/30/2012, 6:29 AM

## 2012-10-01 MED ORDER — METHOCARBAMOL 500 MG PO TABS: 500.0000 mg | ORAL_TABLET | Freq: Four times a day (QID) | ORAL | Status: DC | PRN

## 2012-10-01 MED ORDER — OXYCODONE-ACETAMINOPHEN 5-325 MG PO TABS
1.0000 | ORAL_TABLET | ORAL | Status: DC | PRN
Start: 2012-10-01 — End: 2013-01-13

## 2012-10-01 MED ORDER — CEPHALEXIN 500 MG PO CAPS: 500.0000 mg | ORAL_CAPSULE | Freq: Four times a day (QID) | ORAL | Status: DC

## 2012-10-01 MED ORDER — ASPIRIN 325 MG PO TBEC: 325.0000 mg | DELAYED_RELEASE_TABLET | Freq: Every day | ORAL | Status: DC

## 2012-10-01 NOTE — Progress Notes (Signed)
Patient ID: [name], female   DOB: [birth year], 56 y.o.   MRN: [id number] Looks good today.  Can discharge to home.
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
## 2012-10-01 NOTE — Progress Notes (Signed)
Occupational Therapy Treatment Patient Details Name: [name] MRN: [id number] DOB: [birth year] Today's Date: 10/01/2012 Time: 4098-1191 OT Time Calculation (min): 24 min  OT Assessment / Plan / Recommendation Comments on Treatment Session Pt is at adequate level for d/c home. Pt mobilize in the room per patient report mod I.     Follow Up Recommendations  Home health OT    Barriers to Discharge       Equipment Recommendations  Rolling walker with 5" wheels;3 in 1 bedside comode;Wheelchair (measurements)    Recommendations for Other Services    Frequency Min 2X/week   Plan Discharge plan remains appropriate    Precautions / Restrictions Precautions Precautions: Fall Restrictions RLE Weight Bearing: Non weight bearing   Pertinent Vitals/Pain Numbness reported while ambulating with RT foot in dependent position Pt with recent pain medication and tolerating well    ADL  Toilet Transfer: Supervision/safety Toilet Transfer Method: Sit to stand Toilet Transfer Equipment: Raised toilet seat with arms (or 3-in-1 over toilet) Equipment Used: Gait belt;Rolling walker Transfers/Ambulation Related to ADLs: supervision level >100 ft ambulation, pt static standing with release of RW min guard ADL Comments: pt is ready to d/c home to two dogs [place]. Pt progressing well this session. Pt with good carry over from education on 09/30/12. Pt verbalized to dress Rt LE first for LB dressing. pt able to reach to ankle. Pt demonstrated bed mobility adn basic transfer this session. pt will have 24/7 (A) from husband at d/c    OT Diagnosis:    OT Problem List:   OT Treatment Interventions:     OT Goals Acute Rehab OT Goals OT Goal Formulation: With patient Time For Goal Achievement: 10/07/12 Potential to Achieve Goals: Good ADL Goals Pt Will Perform Grooming: with set-up;Unsupported;Standing at sink;with supervision ADL Goal: Grooming - Progress: Met Pt Will Perform Lower Body  Bathing: with set-up;with supervision;Unsupported;Sitting at sink;Standing at sink ADL Goal: Lower Body Bathing - Progress: Progressing toward goals Pt Will Perform Lower Body Dressing: with set-up;with supervision;Unsupported;Sit to stand from bed;Sit to stand from chair ADL Goal: Lower Body Dressing - Progress: Progressing toward goals Pt Will Transfer to Toilet: with set-up;with supervision;Ambulation;with DME;3-in-1 ADL Goal: Toilet Transfer - Progress: Progressing toward goals Pt Will Perform Toileting - Clothing Manipulation: with modified independence;Standing ADL Goal: Toileting - Clothing Manipulation - Progress: Progressing toward goals Pt Will Perform Toileting - Hygiene: with modified independence;Sit to stand from 3-in-1/toilet ADL Goal: Toileting - Hygiene - Progress: Progressing toward goals Miscellaneous OT Goals Miscellaneous OT Goal #1: Pt will be safe with all of the BADLs above. OT Goal: Miscellaneous Goal #1 - Progress: Met  Visit Information  Last OT Received On: 10/01/12 Assistance Needed: +1 PT/OT Co-Evaluation/Treatment: Yes    Subjective Data      Prior Functioning       Cognition  Overall Cognitive Status: Appears within functional limits for tasks assessed/performed Arousal/Alertness: Awake/alert Orientation Level: Appears intact for tasks assessed Behavior During Session: [facility] for tasks performed    Mobility  Shoulder Instructions Bed Mobility Bed Mobility: Supine to Sit;Sitting - Scoot to Edge of Bed Supine to Sit: 5: Supervision Sitting - Scoot to Edge of Bed: 5: Supervision Details for Bed Mobility Assistance: min v/c hand placement during sequence Transfers Transfers: Sit to Stand;Stand to Sit Sit to Stand: 5: Supervision;With upper extremity assist;From bed Stand to Sit: 5: Supervision;With upper extremity assist;To chair/3-in-1 Details for Transfer Assistance: v/c for hand placement for RW safety  Exercises      Balance Dynamic  Standing Balance Dynamic Standing - Balance Support: During functional activity;Bilateral upper extremity supported Dynamic Standing - Level of Assistance: 5: Stand by assistance   End of Session OT - End of Session Activity Tolerance: Patient tolerated treatment well Patient left: in chair;with call bell/phone within reach Nurse Communication: Mobility status;Precautions  GO     Lucile Shutters 10/01/2012, 11:51 AM Pager: [phone]

## 2012-10-01 NOTE — Progress Notes (Signed)
Physical Therapy Treatment Patient Details Name: [name] MRN: [id number] DOB: 1956-10-09 Today's Date: 10/01/2012 Time: 9147-8295 PT Time Calculation (min): 24 min  PT Assessment / Plan / Recommendation Comments on Treatment Session  Pt. progressing nicely with her therapy.  Complinat with NWB.  Will need to continue PT in home setting post DC.    Follow Up Recommendations  Home health PT     Does the patient have the potential to tolerate intense rehabilitation     Barriers to Discharge        Equipment Recommendations  Rolling walker with 5" wheels;3 in 1 bedside comode;Wheelchair (measurements)    Recommendations for Other Services    Frequency Min 6X/week   Plan Discharge plan remains appropriate    Precautions / Restrictions Precautions Precautions: Fall Restrictions Weight Bearing Restrictions: Yes RLE Weight Bearing: Non weight bearing   Pertinent Vitals/Pain Pain in ankle 2/10, repositioned for comfort    Mobility  Bed Mobility Bed Mobility: Supine to Sit;Sitting - Scoot to Edge of Bed Supine to Sit: 5: Supervision Sitting - Scoot to Edge of Bed: 5: Supervision Sit to Supine: Not Tested (comment) Details for Bed Mobility Assistance: min v/c hand placement during sequence Transfers Transfers: Sit to Stand;Stand to Sit Sit to Stand: 5: Supervision;With upper extremity assist;From bed Stand to Sit: 5: Supervision;With upper extremity assist;To chair/3-in-1 Details for Transfer Assistance: v/c for hand placement for RW safety Ambulation/Gait Ambulation/Gait Assistance: 4: Min guard Ambulation Distance (Feet): 150 Feet Assistive device: Rolling walker Ambulation/Gait Assistance Details: several standing rest breaks needed, safe use of RW Gait Pattern: Step-to pattern General Gait Details: Ioo% compliant with NWB Stairs: No    Exercises     PT Diagnosis:    PT Problem List:   PT Treatment Interventions:     PT Goals Acute Rehab PT Goals PT Goal:  Supine/Side to Sit - Progress: Progressing toward goal PT Goal: Sit to Stand - Progress: Progressing toward goal PT Goal: Stand to Sit - Progress: Progressing toward goal PT Goal: Ambulate - Progress: Progressing toward goal  Visit Information  Last PT Received On: 10/01/12 Assistance Needed: +1    Subjective Data  Subjective: I think I am going home today   Cognition  Overall Cognitive Status: Appears within functional limits for tasks assessed/performed Arousal/Alertness: Awake/alert Orientation Level: Appears intact for tasks assessed Behavior During Session: [facility] for tasks performed    Balance  Dynamic Standing Balance Dynamic Standing - Balance Support: During functional activity;Bilateral upper extremity supported Dynamic Standing - Level of Assistance: 5: Stand by assistance  End of Session PT - End of Session Equipment Utilized During Treatment: Gait belt Activity Tolerance: Patient tolerated treatment well Patient left: in chair;with call bell/phone within reach Nurse Communication: Mobility status   GP     Ferman Hamming 10/01/2012, 1:32 PM Weldon Picking PT Acute Rehab Services [phone] [place] [phone]

## 2012-10-06 ENCOUNTER — Ambulatory Visit (INDEPENDENT_AMBULATORY_CARE_PROVIDER_SITE_OTHER): Payer: PRIVATE HEALTH INSURANCE | Admitting: Cardiology

## 2012-10-06 ENCOUNTER — Encounter: Payer: Self-pay | Admitting: Cardiology

## 2012-10-06 VITALS — BP 123/77 | HR 71 | Ht 65.0 in | Wt 172.0 lb

## 2012-10-06 DIAGNOSIS — I10 Essential (primary) hypertension: Secondary | ICD-10-CM

## 2012-10-06 DIAGNOSIS — Z79899 Other long term (current) drug therapy: Secondary | ICD-10-CM

## 2012-10-06 DIAGNOSIS — I2510 Atherosclerotic heart disease of native coronary artery without angina pectoris: Secondary | ICD-10-CM

## 2012-10-06 DIAGNOSIS — R011 Cardiac murmur, unspecified: Secondary | ICD-10-CM

## 2012-10-06 DIAGNOSIS — Z136 Encounter for screening for cardiovascular disorders: Secondary | ICD-10-CM

## 2012-10-06 MED ORDER — CARVEDILOL 3.125 MG PO TABS: 6.2500 mg | ORAL_TABLET | Freq: Two times a day (BID) | ORAL | Status: DC

## 2012-10-06 MED ORDER — CARVEDILOL 6.25 MG PO TABS: 6.2500 mg | ORAL_TABLET | Freq: Two times a day (BID) | ORAL | Status: DC

## 2012-10-06 MED ORDER — ATORVASTATIN CALCIUM 40 MG PO TABS: 40.0000 mg | ORAL_TABLET | Freq: Every evening | ORAL | Status: DC
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
## 2012-10-06 NOTE — Patient Instructions (Signed)
   Increase Carvedilol to 6.25mg  twice a day   Begin generic Lipitor (Atoravastatin) 40mg  every evening Continue all other current medications.  Labs in 2 months for fasting lipid and liver panel   Echo  Office will contact with results Follow up in  2-3 weeks

## 2012-10-07 ENCOUNTER — Emergency Department (HOSPITAL_COMMUNITY)
Admission: EM | Admit: 2012-10-07 | Discharge: 2012-10-07 | Disposition: A | Discharge status: Home / Self Care | Payer: PRIVATE HEALTH INSURANCE | Attending: Emergency Medicine | Admitting: Emergency Medicine

## 2012-10-07 ENCOUNTER — Encounter (HOSPITAL_COMMUNITY): Payer: Self-pay | Admitting: Physical Medicine and Rehabilitation

## 2012-10-07 ENCOUNTER — Emergency Department (HOSPITAL_COMMUNITY): Payer: PRIVATE HEALTH INSURANCE

## 2012-10-07 DIAGNOSIS — Z8739 Personal history of other diseases of the musculoskeletal system and connective tissue: Secondary | ICD-10-CM | POA: Insufficient documentation

## 2012-10-07 DIAGNOSIS — G8918 Other acute postprocedural pain: Secondary | ICD-10-CM

## 2012-10-07 DIAGNOSIS — Z8719 Personal history of other diseases of the digestive system: Secondary | ICD-10-CM | POA: Insufficient documentation

## 2012-10-07 DIAGNOSIS — Z860100 Personal history of colon polyps, unspecified: Secondary | ICD-10-CM | POA: Insufficient documentation

## 2012-10-07 DIAGNOSIS — I10 Essential (primary) hypertension: Secondary | ICD-10-CM | POA: Insufficient documentation

## 2012-10-07 DIAGNOSIS — M79609 Pain in unspecified limb: Secondary | ICD-10-CM

## 2012-10-07 DIAGNOSIS — Z86718 Personal history of other venous thrombosis and embolism: Secondary | ICD-10-CM | POA: Insufficient documentation

## 2012-10-07 DIAGNOSIS — Z7982 Long term (current) use of aspirin: Secondary | ICD-10-CM | POA: Insufficient documentation

## 2012-10-07 DIAGNOSIS — F411 Generalized anxiety disorder: Secondary | ICD-10-CM | POA: Insufficient documentation

## 2012-10-07 DIAGNOSIS — Z9889 Other specified postprocedural states: Secondary | ICD-10-CM | POA: Insufficient documentation

## 2012-10-07 DIAGNOSIS — M25571 Pain in right ankle and joints of right foot: Secondary | ICD-10-CM

## 2012-10-07 DIAGNOSIS — Z79899 Other long term (current) drug therapy: Secondary | ICD-10-CM | POA: Insufficient documentation

## 2012-10-07 MED ORDER — ONDANSETRON 4 MG PO TBDP
8.0000 mg | ORAL_TABLET | Freq: Once | ORAL | Status: AC
Start: 2012-10-07 — End: 2012-10-07
  Administered 2012-10-07: 8 mg via ORAL
  Filled 2012-10-07: qty 2

## 2012-10-07 MED ORDER — HYDROMORPHONE HCL PF 2 MG/ML IJ SOLN
2.0000 mg | Freq: Once | INTRAMUSCULAR | Status: AC
  Administered 2012-10-07: 2 mg via INTRAMUSCULAR
  Filled 2012-10-07: qty 1

## 2012-10-07 MED ORDER — OXYCODONE-ACETAMINOPHEN 5-325 MG PO TABS: 1.0000 | ORAL_TABLET | Freq: Four times a day (QID) | ORAL | Status: DC | PRN

## 2012-10-07 NOTE — Progress Notes (Signed)
Orthopedic Tech Progress Note Patient Details:  Fionna Merriott [facility] [birth year] [id number] Stirrup splint applied to Right LE. Before application of splint cotton web roll applied to foot/ankle and lower leg to protect from splint. Note that stitches are present. Double padding around these areas. Stirrup splint applied. Upon application patient was asked if splint was too tight or had gotten too hot. Patient replied no to both questions. Toes visible with postive capillary refill. Patient reminded not to bear weight on Right leg and not get splint wet. Husband present during application of splint.   Ortho Devices Type of Ortho Device: Stirrup splint Ortho Device/Splint Location: Right LE Ortho Device/Splint Interventions: Application   Asia R Thompson 10/07/2012, 1:15 PM

## 2012-10-07 NOTE — ED Notes (Signed)
PATIENT WITH MEDIAL AND LATERAL ANKLE INCISIONS FROM FRACTURE REPAIR LAST WEEK. STATES SHE BECAME CONCERNED BECAUSE OF THE PAIN IN HER ANKLE AND THE SWELLING IN HER LEG. DENIES CALF PAIN. INCISIONS ARE WELL APPROXIMATED WITH SUTURES. NO VISIBLE ERYTHEMA OR DRAINAGE
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
## 2012-10-07 NOTE — ED Provider Notes (Signed)
Medical screening examination/treatment/procedure(s) were conducted as a shared visit with non-physician practitioner(s) and myself.  I personally evaluated the patient during the encounter  The patient's surgical incisions are without signs of infection.  Compartments are soft.  Normal PT and DP pulses of her right foot.  Ultrasound negative for deep vein thrombosis.  The patient's leg was resplinted and, pain treated, orthopedic followup.    Lyanne Co, MD 10/07/12 1247

## 2012-10-07 NOTE — ED Notes (Signed)
PT HAS ARRIVED IN CDU FROM VASCULAR LAB 

## 2012-10-07 NOTE — ED Provider Notes (Signed)
11:27 AM  56 yo F w recent bimalleolar right ankle fracture repaired in the operating room by Dr. Magnus Ivan on 09/29/12 presents to ER c/o worsening ankle pain. Pt care resumed from [place], Pod B/D. Xray w no acute abnormalities. Vascular imaging pending r/o DVT. Plan per previous provider is to consult ortho with results to discuss disposition plan. Anticipate dc with pain medication. Pt re-evaluated & on exam: hemodynamically stable, NAD, heart w/ RRR, lungs CTAB, Chest & abd non-tender, no calf tenderness, right lower extremity with swelling, ttp along lateral & medial malleolus- post surgical swelling as expected..   11:51 AM  VASCULAR LAB  PRELIMINARY PRELIMINARY PRELIMINARY PRELIMINARY  Right lower extremity venous Doppler completed.  Preliminary report: There is no DVT or SVT noted in the right lower extremity.  KANADY, CANDACE,  10/07/2012, 11:37 AM  Consult Orthopedics: DC pt w instructions to take 2 tylenol 325 daily and f-u wed.   Jaci Carrel, [place] 10/07/12 1237

## 2012-10-07 NOTE — ED Notes (Signed)
ORTHO COMING TO APPLY SPLINT

## 2012-10-07 NOTE — ED Provider Notes (Signed)
Medical screening examination/treatment/procedure(s) were conducted as a shared visit with non-physician practitioner(s) and myself.  I personally evaluated the patient during the encounter  Please see my other note  Lyanne Co, MD 10/07/12 1247

## 2012-10-07 NOTE — Progress Notes (Signed)
VASCULAR LAB PRELIMINARY  PRELIMINARY  PRELIMINARY  PRELIMINARY  Right lower extremity venous Doppler completed.    Preliminary report:  There is no DVT or SVT noted in the right lower extremity.  [name], 10/07/2012, 11:37 AM

## 2012-10-07 NOTE — ED Notes (Addendum)
Vascular contacted about venous study. Pt to have study performed in exam room.
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
## 2012-10-07 NOTE — ED Notes (Signed)
R ankle splint removed by PA. Ankle and foot noted to be swollen. Sensation and movement intact. Incision sites healing properly, inflammation noted, but no drainage. Ankle elevated at the time. Will continue to monitor.

## 2012-10-10 DIAGNOSIS — I10 Essential (primary) hypertension: Secondary | ICD-10-CM | POA: Insufficient documentation

## 2012-10-10 DIAGNOSIS — I2510 Atherosclerotic heart disease of native coronary artery without angina pectoris: Secondary | ICD-10-CM | POA: Insufficient documentation

## 2012-10-10 DIAGNOSIS — R011 Cardiac murmur, unspecified: Secondary | ICD-10-CM | POA: Insufficient documentation

## 2012-10-13 ENCOUNTER — Other Ambulatory Visit: Payer: PRIVATE HEALTH INSURANCE

## 2012-10-19 ENCOUNTER — Encounter: Payer: PRIVATE HEALTH INSURANCE | Admitting: Cardiology

## 2012-10-19 NOTE — Progress Notes (Signed)
Patient ID: [name], female   DOB: October 09, 1956, 56 y.o.   MRN: [id number]

## 2012-10-21 ENCOUNTER — Other Ambulatory Visit: Payer: PRIVATE HEALTH INSURANCE

## 2012-10-21 ENCOUNTER — Encounter: Payer: Self-pay | Admitting: Cardiology

## 2012-10-21 DIAGNOSIS — R0989 Other specified symptoms and signs involving the circulatory and respiratory systems: Secondary | ICD-10-CM

## 2012-11-18 ENCOUNTER — Other Ambulatory Visit (INDEPENDENT_AMBULATORY_CARE_PROVIDER_SITE_OTHER): Payer: PRIVATE HEALTH INSURANCE

## 2012-11-18 ENCOUNTER — Other Ambulatory Visit: Payer: Self-pay

## 2012-11-18 DIAGNOSIS — I2510 Atherosclerotic heart disease of native coronary artery without angina pectoris: Secondary | ICD-10-CM

## 2012-11-18 DIAGNOSIS — R011 Cardiac murmur, unspecified: Secondary | ICD-10-CM

## 2012-11-19 ENCOUNTER — Telehealth: Payer: Self-pay | Admitting: Cardiology

## 2012-11-19 NOTE — Telephone Encounter (Signed)
Returning call to someone that left her a message?

## 2012-11-22 ENCOUNTER — Telehealth: Payer: Self-pay | Admitting: *Deleted

## 2012-11-22 NOTE — Telephone Encounter (Signed)
Notes Recorded by Lesle Chris, LPN on 9/60/4540 at 8:52 AM Patient notified. Already has follow up scheduled with Dr. Shirlee Latch for 1/31. ------  Notes Recorded by Lesle Chris, LPN on 9/81/1914 at 1:28 PM Left message to return call.

## 2012-11-22 NOTE — Telephone Encounter (Signed)
Message copied by Lesle Chris on Mon Nov 22, 2012  8:53 AM ------      Message from: [place], [place] S      Created: Fri Nov 19, 2012 12:57 PM       Normal EF, minimal AS, mild MR.

## 2012-11-22 NOTE — Telephone Encounter (Signed)
Patient notified

## 2012-12-08 ENCOUNTER — Telehealth: Payer: Self-pay | Admitting: Cardiology

## 2012-12-08 NOTE — Telephone Encounter (Signed)
[facility]ed [facility] for procedure scheduled tomorrow.  Fax [phone]

## 2012-12-10 ENCOUNTER — Ambulatory Visit: Payer: PRIVATE HEALTH INSURANCE | Admitting: Cardiology

## 2013-01-13 ENCOUNTER — Encounter: Payer: Self-pay | Admitting: Cardiology

## 2013-01-13 ENCOUNTER — Ambulatory Visit (INDEPENDENT_AMBULATORY_CARE_PROVIDER_SITE_OTHER): Payer: PRIVATE HEALTH INSURANCE | Admitting: Cardiology

## 2013-01-13 VITALS — BP 116/77 | HR 65 | Ht 65.0 in | Wt 180.0 lb

## 2013-01-13 NOTE — Patient Instructions (Addendum)
Your physician recommends that you schedule a follow-up appointment in: 1 year. You will receive a reminder letter in the mail in about 10 months reminding you to call and schedule your appointment. If you don't receive this letter, please contact our office. Your physician recommends that you continue on your current medications as directed. Please refer to the Current Medication list given to you today. Your physician has requested that you have a carotid duplex. This test is an ultrasound of the carotid arteries in your neck. It looks at blood flow through these arteries that supply the brain with blood. Allow one hour for this exam. There are no restrictions or special instructions.  
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
## 2013-01-20 ENCOUNTER — Encounter (INDEPENDENT_AMBULATORY_CARE_PROVIDER_SITE_OTHER): Payer: PRIVATE HEALTH INSURANCE

## 2013-01-20 DIAGNOSIS — I6529 Occlusion and stenosis of unspecified carotid artery: Secondary | ICD-10-CM

## 2013-01-20 DIAGNOSIS — R0989 Other specified symptoms and signs involving the circulatory and respiratory systems: Secondary | ICD-10-CM

## 2013-01-26 ENCOUNTER — Telehealth: Payer: Self-pay | Admitting: *Deleted

## 2013-01-26 NOTE — Telephone Encounter (Signed)
Patient informed. 

## 2013-01-26 NOTE — Telephone Encounter (Signed)
Message copied by Eustace Moore on Wed Jan 26, 2013 11:05 AM ------      Message from: Meredeth Ide, PAMELA J      Created: Tue Jan 25, 2013 12:40 PM                   ----- Message -----         From: Rollene Rotunda, MD         Sent: 01/25/2013  11:39 AM           To: Rocco Serene, RN            Follow up as suggested. ------

## 2013-09-27 ENCOUNTER — Encounter: Payer: Self-pay | Admitting: Cardiovascular Disease

## 2013-09-27 ENCOUNTER — Ambulatory Visit: Payer: PRIVATE HEALTH INSURANCE | Admitting: Cardiovascular Disease

## 2013-10-28 ENCOUNTER — Telehealth: Payer: Self-pay | Admitting: *Deleted

## 2013-10-28 NOTE — Telephone Encounter (Signed)
Patient called office c/o chest pain/pressure.  Asking to be seen by Dr. Purvis Sheffield today.    Discussed with patient & stated that she had been having this about a month now.  Stated she took 4 Ntg yesterday.  Patient did have OV for 11/18 for this same issue, but did not show.  Patient stated she had been under a lot of stress lately.  Funeral for family member this afternoon at 2:00.  Patient is presently calling from work ([employer]).    Informed patient per Dr. Purvis Sheffield that she should go to ED for evaluation.  Advised to go to [facility] if possible.  Patient verbalized understanding.

## 2013-12-22 ENCOUNTER — Ambulatory Visit (INDEPENDENT_AMBULATORY_CARE_PROVIDER_SITE_OTHER): Payer: Medicare Other | Admitting: Cardiovascular Disease

## 2013-12-22 VITALS — BP 155/103 | HR 68 | Ht 65.0 in | Wt 193.0 lb

## 2013-12-22 DIAGNOSIS — I6523 Occlusion and stenosis of bilateral carotid arteries: Secondary | ICD-10-CM

## 2013-12-22 DIAGNOSIS — R0602 Shortness of breath: Secondary | ICD-10-CM

## 2013-12-22 DIAGNOSIS — I10 Essential (primary) hypertension: Secondary | ICD-10-CM

## 2013-12-22 DIAGNOSIS — I2510 Atherosclerotic heart disease of native coronary artery without angina pectoris: Secondary | ICD-10-CM

## 2013-12-22 DIAGNOSIS — I658 Occlusion and stenosis of other precerebral arteries: Secondary | ICD-10-CM

## 2013-12-22 DIAGNOSIS — I6529 Occlusion and stenosis of unspecified carotid artery: Secondary | ICD-10-CM

## 2013-12-22 DIAGNOSIS — R079 Chest pain, unspecified: Secondary | ICD-10-CM

## 2013-12-22 MED ORDER — BENAZEPRIL HCL 20 MG PO TABS: ORAL_TABLET | ORAL | Status: DC

## 2013-12-22 MED ORDER — CHLORTHALIDONE 25 MG PO TABS: 25.0000 mg | ORAL_TABLET | Freq: Every day | ORAL | Status: DC

## 2013-12-22 NOTE — Patient Instructions (Addendum)
Your physician recommends that you schedule a follow-up appointment in: 2 weeks with Dr. Bronson Ing in 2 weeks in [place] office. You will schedule this today before you leave.  Your physician has recommended you make the following change in your medication:  Decrease Aspirin 81 MG take one tablet by mouth once daily. Add Benazepril 20 Mg take one tablet by mouth every night. Start: Chlorthalidone 25 MG take one tablet by mouth every day Stop: Furosemide (Lasix)  Your physician recommends that you return for lab work in: Around 12-29-13 for BMET  You can go to the following locations to get lab work done: [facility] [address] Dr. Edrick Oh office in [facility].
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
## 2013-12-30 ENCOUNTER — Ambulatory Visit: Payer: Medicare Other | Admitting: Cardiovascular Disease

## 2013-12-30 ENCOUNTER — Encounter: Payer: Self-pay | Admitting: Cardiovascular Disease

## 2014-04-07 NOTE — Progress Notes (Signed)
This encounter was created in error - please disregard.

## 2014-06-22 ENCOUNTER — Encounter: Payer: Self-pay | Admitting: Cardiovascular Disease

## 2014-06-22 ENCOUNTER — Encounter: Payer: Self-pay | Admitting: *Deleted

## 2014-06-22 ENCOUNTER — Telehealth: Payer: Self-pay | Admitting: Cardiovascular Disease

## 2014-06-22 ENCOUNTER — Other Ambulatory Visit (HOSPITAL_COMMUNITY): Payer: Self-pay | Admitting: Cardiology

## 2014-06-22 ENCOUNTER — Ambulatory Visit (INDEPENDENT_AMBULATORY_CARE_PROVIDER_SITE_OTHER): Payer: Medicare Other | Admitting: Cardiovascular Disease

## 2014-06-22 VITALS — BP 137/85 | HR 59 | Ht 65.0 in | Wt 182.0 lb

## 2014-06-22 DIAGNOSIS — I6523 Occlusion and stenosis of bilateral carotid arteries: Secondary | ICD-10-CM

## 2014-06-22 DIAGNOSIS — I6529 Occlusion and stenosis of unspecified carotid artery: Secondary | ICD-10-CM

## 2014-06-22 DIAGNOSIS — R55 Syncope and collapse: Secondary | ICD-10-CM

## 2014-06-22 DIAGNOSIS — Z87898 Personal history of other specified conditions: Secondary | ICD-10-CM

## 2014-06-22 DIAGNOSIS — R079 Chest pain, unspecified: Secondary | ICD-10-CM

## 2014-06-22 DIAGNOSIS — I658 Occlusion and stenosis of other precerebral arteries: Secondary | ICD-10-CM

## 2014-06-22 DIAGNOSIS — Z9289 Personal history of other medical treatment: Secondary | ICD-10-CM

## 2014-06-22 DIAGNOSIS — I10 Essential (primary) hypertension: Secondary | ICD-10-CM
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
## 2014-06-22 NOTE — Telephone Encounter (Signed)
[facility] GJFT#[id number] EXP 08-06-14

## 2014-06-22 NOTE — Telephone Encounter (Signed)
Lexiscan for chest pain and syncope Aug 21 arrive 8:45

## 2014-06-22 NOTE — Addendum Note (Signed)
Addended by: Orion Modest on: 06/22/2014 05:02 PM   Modules accepted: Orders

## 2014-06-22 NOTE — Patient Instructions (Addendum)
Continue all current medications. Your physician has recommended that you wear a 30 day holter monitor. Holter monitors are medical devices that record the heart's electrical activity. Doctors most often use these monitors to diagnose arrhythmias. Arrhythmias are problems with the speed or rhythm of the heartbeat. The monitor is a small, portable device. You can wear one while you do your normal daily activities. This is usually used to diagnose what is causing palpitations/syncope (passing out). Office will contact with results via phone or letter.   Exercise Cardiolite to be done at [facility] Follow up in 6-8 weeks

## 2014-06-23 ENCOUNTER — Other Ambulatory Visit: Payer: Self-pay | Admitting: *Deleted

## 2014-06-23 DIAGNOSIS — R55 Syncope and collapse: Secondary | ICD-10-CM

## 2014-06-23 DIAGNOSIS — R079 Chest pain, unspecified: Secondary | ICD-10-CM

## 2014-06-23 NOTE — Telephone Encounter (Signed)
Changed order from [place] to Exercise Stress

## 2014-06-28 ENCOUNTER — Other Ambulatory Visit: Payer: Self-pay | Admitting: *Deleted

## 2014-06-28 DIAGNOSIS — R079 Chest pain, unspecified: Secondary | ICD-10-CM

## 2014-06-30 ENCOUNTER — Encounter (HOSPITAL_COMMUNITY): Payer: Medicare Other

## 2014-06-30 ENCOUNTER — Ambulatory Visit (HOSPITAL_COMMUNITY): Admission: RE | Admit: 2014-06-30 | Payer: Medicare Other | Source: Ambulatory Visit

## 2014-07-06 ENCOUNTER — Encounter: Payer: Self-pay | Admitting: Cardiovascular Disease

## 2014-07-06 DIAGNOSIS — R0989 Other specified symptoms and signs involving the circulatory and respiratory systems: Secondary | ICD-10-CM

## 2014-07-13 ENCOUNTER — Encounter (INDEPENDENT_AMBULATORY_CARE_PROVIDER_SITE_OTHER): Payer: Medicare Other

## 2014-07-13 DIAGNOSIS — I6529 Occlusion and stenosis of unspecified carotid artery: Secondary | ICD-10-CM

## 2014-07-18 ENCOUNTER — Encounter: Payer: Self-pay | Admitting: *Deleted

## 2014-07-21 ENCOUNTER — Other Ambulatory Visit: Payer: Self-pay | Admitting: *Deleted

## 2014-07-21 DIAGNOSIS — I2510 Atherosclerotic heart disease of native coronary artery without angina pectoris: Secondary | ICD-10-CM

## 2014-07-21 DIAGNOSIS — I2583 Coronary atherosclerosis due to lipid rich plaque: Principal | ICD-10-CM

## 2014-08-10 ENCOUNTER — Encounter: Payer: Self-pay | Admitting: *Deleted

## 2014-08-10 ENCOUNTER — Ambulatory Visit: Payer: Medicare Other | Admitting: Cardiovascular Disease

## 2014-08-10 NOTE — Progress Notes (Signed)
Patient ID: [name], female   DOB: 1956/06/20, 58 y.o.   MRN: [id number]  Patient no show for follow up appointment with Dr. Bronson Ing today.  She also did not complete her 30 day monitor.  Multiple attempts made by [employer] (e-cardio) & nurse, but unsuccessful.    ---agh

## 2014-09-03 DIAGNOSIS — Z86718 Personal history of other venous thrombosis and embolism: Secondary | ICD-10-CM | POA: Insufficient documentation

## 2015-11-19 DIAGNOSIS — M5136 Other intervertebral disc degeneration, lumbar region: Secondary | ICD-10-CM | POA: Diagnosis not present

## 2015-11-19 DIAGNOSIS — Z79891 Long term (current) use of opiate analgesic: Secondary | ICD-10-CM | POA: Diagnosis not present

## 2015-11-19 DIAGNOSIS — Z8249 Family history of ischemic heart disease and other diseases of the circulatory system: Secondary | ICD-10-CM | POA: Diagnosis not present

## 2015-11-19 DIAGNOSIS — Z79899 Other long term (current) drug therapy: Secondary | ICD-10-CM | POA: Diagnosis not present

## 2015-11-19 DIAGNOSIS — M47816 Spondylosis without myelopathy or radiculopathy, lumbar region: Secondary | ICD-10-CM | POA: Diagnosis not present

## 2015-11-19 DIAGNOSIS — M7060 Trochanteric bursitis, unspecified hip: Secondary | ICD-10-CM | POA: Diagnosis not present

## 2015-11-19 DIAGNOSIS — M25561 Pain in right knee: Secondary | ICD-10-CM | POA: Diagnosis not present

## 2015-11-19 DIAGNOSIS — Z886 Allergy status to analgesic agent status: Secondary | ICD-10-CM | POA: Diagnosis not present

## 2015-11-19 DIAGNOSIS — Z9884 Bariatric surgery status: Secondary | ICD-10-CM | POA: Diagnosis not present

## 2015-11-19 DIAGNOSIS — M47812 Spondylosis without myelopathy or radiculopathy, cervical region: Secondary | ICD-10-CM | POA: Diagnosis not present

## 2015-11-19 DIAGNOSIS — M461 Sacroiliitis, not elsewhere classified: Secondary | ICD-10-CM | POA: Diagnosis not present

## 2015-11-19 DIAGNOSIS — I10 Essential (primary) hypertension: Secondary | ICD-10-CM | POA: Diagnosis not present

## 2015-11-19 DIAGNOSIS — M25552 Pain in left hip: Secondary | ICD-10-CM | POA: Diagnosis not present

## 2015-11-19 DIAGNOSIS — M25562 Pain in left knee: Secondary | ICD-10-CM | POA: Diagnosis not present

## 2015-11-19 DIAGNOSIS — Z888 Allergy status to other drugs, medicaments and biological substances status: Secondary | ICD-10-CM | POA: Diagnosis not present

## 2015-12-20 DIAGNOSIS — M461 Sacroiliitis, not elsewhere classified: Secondary | ICD-10-CM | POA: Diagnosis not present

## 2015-12-20 DIAGNOSIS — M47816 Spondylosis without myelopathy or radiculopathy, lumbar region: Secondary | ICD-10-CM | POA: Diagnosis not present

## 2015-12-20 DIAGNOSIS — M47812 Spondylosis without myelopathy or radiculopathy, cervical region: Secondary | ICD-10-CM | POA: Diagnosis not present

## 2015-12-20 DIAGNOSIS — M5136 Other intervertebral disc degeneration, lumbar region: Secondary | ICD-10-CM | POA: Diagnosis not present

## 2016-01-22 DIAGNOSIS — Z0000 Encounter for general adult medical examination without abnormal findings: Secondary | ICD-10-CM | POA: Diagnosis not present

## 2016-01-22 DIAGNOSIS — I10 Essential (primary) hypertension: Secondary | ICD-10-CM | POA: Diagnosis not present

## 2016-04-29 DIAGNOSIS — I10 Essential (primary) hypertension: Secondary | ICD-10-CM | POA: Diagnosis not present

## 2016-04-29 DIAGNOSIS — Z0000 Encounter for general adult medical examination without abnormal findings: Secondary | ICD-10-CM | POA: Diagnosis not present

## 2016-04-29 DIAGNOSIS — Z1389 Encounter for screening for other disorder: Secondary | ICD-10-CM | POA: Diagnosis not present

## 2016-04-29 DIAGNOSIS — K210 Gastro-esophageal reflux disease with esophagitis: Secondary | ICD-10-CM | POA: Diagnosis not present

## 2016-05-07 DIAGNOSIS — Z5181 Encounter for therapeutic drug level monitoring: Secondary | ICD-10-CM | POA: Diagnosis not present

## 2016-05-07 DIAGNOSIS — Z79899 Other long term (current) drug therapy: Secondary | ICD-10-CM | POA: Diagnosis not present

## 2016-05-07 DIAGNOSIS — F419 Anxiety disorder, unspecified: Secondary | ICD-10-CM | POA: Diagnosis not present

## 2016-06-24 DIAGNOSIS — Z79899 Other long term (current) drug therapy: Secondary | ICD-10-CM | POA: Diagnosis not present

## 2016-06-24 DIAGNOSIS — M1712 Unilateral primary osteoarthritis, left knee: Secondary | ICD-10-CM | POA: Diagnosis not present

## 2016-06-24 DIAGNOSIS — M47812 Spondylosis without myelopathy or radiculopathy, cervical region: Secondary | ICD-10-CM | POA: Diagnosis not present

## 2016-06-24 DIAGNOSIS — M25562 Pain in left knee: Secondary | ICD-10-CM | POA: Diagnosis not present

## 2016-06-24 DIAGNOSIS — M47817 Spondylosis without myelopathy or radiculopathy, lumbosacral region: Secondary | ICD-10-CM | POA: Diagnosis not present

## 2016-08-08 DIAGNOSIS — I10 Essential (primary) hypertension: Secondary | ICD-10-CM | POA: Diagnosis not present

## 2016-08-08 DIAGNOSIS — K210 Gastro-esophageal reflux disease with esophagitis: Secondary | ICD-10-CM | POA: Diagnosis not present

## 2016-08-15 ENCOUNTER — Encounter (HOSPITAL_COMMUNITY): Payer: Self-pay

## 2016-08-16 DIAGNOSIS — Z7982 Long term (current) use of aspirin: Secondary | ICD-10-CM | POA: Diagnosis not present

## 2016-08-16 DIAGNOSIS — I10 Essential (primary) hypertension: Secondary | ICD-10-CM | POA: Diagnosis not present

## 2016-08-16 DIAGNOSIS — J209 Acute bronchitis, unspecified: Secondary | ICD-10-CM | POA: Diagnosis not present

## 2016-08-16 DIAGNOSIS — K219 Gastro-esophageal reflux disease without esophagitis: Secondary | ICD-10-CM | POA: Diagnosis not present

## 2016-08-16 DIAGNOSIS — Z79899 Other long term (current) drug therapy: Secondary | ICD-10-CM | POA: Diagnosis not present

## 2016-08-21 DIAGNOSIS — M47817 Spondylosis without myelopathy or radiculopathy, lumbosacral region: Secondary | ICD-10-CM | POA: Diagnosis not present

## 2016-08-21 DIAGNOSIS — Z79891 Long term (current) use of opiate analgesic: Secondary | ICD-10-CM | POA: Diagnosis not present

## 2016-08-21 DIAGNOSIS — M1712 Unilateral primary osteoarthritis, left knee: Secondary | ICD-10-CM | POA: Diagnosis not present

## 2016-08-21 DIAGNOSIS — G894 Chronic pain syndrome: Secondary | ICD-10-CM | POA: Diagnosis not present

## 2016-08-27 DIAGNOSIS — M545 Low back pain: Secondary | ICD-10-CM | POA: Diagnosis not present

## 2016-08-27 DIAGNOSIS — M25562 Pain in left knee: Secondary | ICD-10-CM | POA: Diagnosis not present

## 2016-08-27 DIAGNOSIS — M47817 Spondylosis without myelopathy or radiculopathy, lumbosacral region: Secondary | ICD-10-CM | POA: Diagnosis not present

## 2016-08-27 DIAGNOSIS — M1712 Unilateral primary osteoarthritis, left knee: Secondary | ICD-10-CM | POA: Diagnosis not present

## 2016-09-05 DIAGNOSIS — H6121 Impacted cerumen, right ear: Secondary | ICD-10-CM | POA: Diagnosis not present

## 2016-09-05 DIAGNOSIS — J40 Bronchitis, not specified as acute or chronic: Secondary | ICD-10-CM | POA: Diagnosis not present

## 2016-09-15 DIAGNOSIS — Z79891 Long term (current) use of opiate analgesic: Secondary | ICD-10-CM | POA: Diagnosis not present

## 2016-09-15 DIAGNOSIS — G8929 Other chronic pain: Secondary | ICD-10-CM | POA: Diagnosis not present

## 2016-09-15 DIAGNOSIS — M47817 Spondylosis without myelopathy or radiculopathy, lumbosacral region: Secondary | ICD-10-CM | POA: Diagnosis not present

## 2016-09-15 DIAGNOSIS — M1712 Unilateral primary osteoarthritis, left knee: Secondary | ICD-10-CM | POA: Diagnosis not present

## 2016-09-23 DIAGNOSIS — Z1231 Encounter for screening mammogram for malignant neoplasm of breast: Secondary | ICD-10-CM | POA: Diagnosis not present

## 2016-10-20 DIAGNOSIS — M1712 Unilateral primary osteoarthritis, left knee: Secondary | ICD-10-CM | POA: Diagnosis not present

## 2016-10-20 DIAGNOSIS — M47817 Spondylosis without myelopathy or radiculopathy, lumbosacral region: Secondary | ICD-10-CM | POA: Diagnosis not present

## 2016-10-20 DIAGNOSIS — Z79891 Long term (current) use of opiate analgesic: Secondary | ICD-10-CM | POA: Diagnosis not present

## 2016-10-20 DIAGNOSIS — G8929 Other chronic pain: Secondary | ICD-10-CM | POA: Diagnosis not present

## 2016-11-06 DIAGNOSIS — I10 Essential (primary) hypertension: Secondary | ICD-10-CM | POA: Diagnosis not present

## 2016-11-06 DIAGNOSIS — K219 Gastro-esophageal reflux disease without esophagitis: Secondary | ICD-10-CM | POA: Diagnosis not present

## 2016-11-06 DIAGNOSIS — M25511 Pain in right shoulder: Secondary | ICD-10-CM | POA: Diagnosis not present

## 2017-01-17 DIAGNOSIS — Z7982 Long term (current) use of aspirin: Secondary | ICD-10-CM | POA: Diagnosis not present

## 2017-01-17 DIAGNOSIS — K219 Gastro-esophageal reflux disease without esophagitis: Secondary | ICD-10-CM | POA: Diagnosis not present

## 2017-01-17 DIAGNOSIS — Z79899 Other long term (current) drug therapy: Secondary | ICD-10-CM | POA: Diagnosis not present

## 2017-01-17 DIAGNOSIS — J069 Acute upper respiratory infection, unspecified: Secondary | ICD-10-CM | POA: Diagnosis not present

## 2017-01-17 DIAGNOSIS — R05 Cough: Secondary | ICD-10-CM | POA: Diagnosis not present

## 2017-01-17 DIAGNOSIS — I10 Essential (primary) hypertension: Secondary | ICD-10-CM | POA: Diagnosis not present

## 2017-01-19 DIAGNOSIS — J208 Acute bronchitis due to other specified organisms: Secondary | ICD-10-CM | POA: Diagnosis not present

## 2017-01-20 DIAGNOSIS — M5136 Other intervertebral disc degeneration, lumbar region: Secondary | ICD-10-CM | POA: Diagnosis not present

## 2017-01-20 DIAGNOSIS — G894 Chronic pain syndrome: Secondary | ICD-10-CM | POA: Diagnosis not present

## 2017-01-20 DIAGNOSIS — M47812 Spondylosis without myelopathy or radiculopathy, cervical region: Secondary | ICD-10-CM | POA: Diagnosis not present

## 2017-01-20 DIAGNOSIS — Z79891 Long term (current) use of opiate analgesic: Secondary | ICD-10-CM | POA: Diagnosis not present

## 2017-01-20 DIAGNOSIS — M47817 Spondylosis without myelopathy or radiculopathy, lumbosacral region: Secondary | ICD-10-CM | POA: Diagnosis not present

## 2017-01-23 DIAGNOSIS — R05 Cough: Secondary | ICD-10-CM | POA: Diagnosis not present

## 2017-01-23 DIAGNOSIS — Z7982 Long term (current) use of aspirin: Secondary | ICD-10-CM | POA: Diagnosis not present

## 2017-01-23 DIAGNOSIS — K219 Gastro-esophageal reflux disease without esophagitis: Secondary | ICD-10-CM | POA: Diagnosis not present

## 2017-01-23 DIAGNOSIS — I10 Essential (primary) hypertension: Secondary | ICD-10-CM | POA: Diagnosis not present

## 2017-01-23 DIAGNOSIS — R062 Wheezing: Secondary | ICD-10-CM | POA: Diagnosis not present

## 2017-01-23 DIAGNOSIS — Z79899 Other long term (current) drug therapy: Secondary | ICD-10-CM | POA: Diagnosis not present

## 2017-01-24 DIAGNOSIS — R05 Cough: Secondary | ICD-10-CM | POA: Diagnosis not present

## 2017-02-04 DIAGNOSIS — I10 Essential (primary) hypertension: Secondary | ICD-10-CM | POA: Diagnosis not present

## 2017-03-02 DIAGNOSIS — M25512 Pain in left shoulder: Secondary | ICD-10-CM | POA: Diagnosis not present

## 2017-03-03 DIAGNOSIS — M25512 Pain in left shoulder: Secondary | ICD-10-CM | POA: Diagnosis not present

## 2017-03-04 DIAGNOSIS — Z79891 Long term (current) use of opiate analgesic: Secondary | ICD-10-CM | POA: Diagnosis not present

## 2017-03-09 DIAGNOSIS — M5136 Other intervertebral disc degeneration, lumbar region: Secondary | ICD-10-CM | POA: Diagnosis not present

## 2017-03-09 DIAGNOSIS — M47817 Spondylosis without myelopathy or radiculopathy, lumbosacral region: Secondary | ICD-10-CM | POA: Diagnosis not present

## 2017-03-17 ENCOUNTER — Encounter (HOSPITAL_COMMUNITY): Payer: Self-pay

## 2017-04-09 DIAGNOSIS — M47817 Spondylosis without myelopathy or radiculopathy, lumbosacral region: Secondary | ICD-10-CM | POA: Diagnosis not present

## 2017-04-09 DIAGNOSIS — G8929 Other chronic pain: Secondary | ICD-10-CM | POA: Diagnosis not present

## 2017-04-09 DIAGNOSIS — Z79891 Long term (current) use of opiate analgesic: Secondary | ICD-10-CM | POA: Diagnosis not present

## 2017-05-12 DIAGNOSIS — Z79899 Other long term (current) drug therapy: Secondary | ICD-10-CM | POA: Diagnosis not present

## 2017-05-12 DIAGNOSIS — Z1389 Encounter for screening for other disorder: Secondary | ICD-10-CM | POA: Diagnosis not present

## 2017-05-12 DIAGNOSIS — Z0000 Encounter for general adult medical examination without abnormal findings: Secondary | ICD-10-CM | POA: Diagnosis not present

## 2017-05-12 DIAGNOSIS — K219 Gastro-esophageal reflux disease without esophagitis: Secondary | ICD-10-CM | POA: Diagnosis not present

## 2017-05-12 DIAGNOSIS — I10 Essential (primary) hypertension: Secondary | ICD-10-CM | POA: Diagnosis not present

## 2017-05-26 DIAGNOSIS — Z79899 Other long term (current) drug therapy: Secondary | ICD-10-CM | POA: Diagnosis not present

## 2017-05-26 DIAGNOSIS — M5136 Other intervertebral disc degeneration, lumbar region: Secondary | ICD-10-CM | POA: Diagnosis not present

## 2017-06-24 DIAGNOSIS — Z79899 Other long term (current) drug therapy: Secondary | ICD-10-CM | POA: Diagnosis not present

## 2017-07-03 DIAGNOSIS — J410 Simple chronic bronchitis: Secondary | ICD-10-CM | POA: Diagnosis not present

## 2017-07-27 DIAGNOSIS — M545 Low back pain: Secondary | ICD-10-CM | POA: Diagnosis not present

## 2017-07-27 DIAGNOSIS — Z79899 Other long term (current) drug therapy: Secondary | ICD-10-CM | POA: Diagnosis not present

## 2017-07-27 DIAGNOSIS — M542 Cervicalgia: Secondary | ICD-10-CM | POA: Diagnosis not present

## 2017-07-27 DIAGNOSIS — I10 Essential (primary) hypertension: Secondary | ICD-10-CM | POA: Diagnosis not present

## 2017-08-26 DIAGNOSIS — Z9884 Bariatric surgery status: Secondary | ICD-10-CM | POA: Diagnosis not present

## 2017-08-28 DIAGNOSIS — I10 Essential (primary) hypertension: Secondary | ICD-10-CM | POA: Diagnosis not present

## 2017-08-28 DIAGNOSIS — Z0000 Encounter for general adult medical examination without abnormal findings: Secondary | ICD-10-CM | POA: Diagnosis not present

## 2017-08-31 DIAGNOSIS — M719 Bursopathy, unspecified: Secondary | ICD-10-CM | POA: Diagnosis not present

## 2017-08-31 DIAGNOSIS — M25559 Pain in unspecified hip: Secondary | ICD-10-CM | POA: Diagnosis not present

## 2017-08-31 DIAGNOSIS — Z79899 Other long term (current) drug therapy: Secondary | ICD-10-CM | POA: Diagnosis not present

## 2017-09-10 ENCOUNTER — Encounter: Payer: Medicare Other | Attending: Surgery | Admitting: Skilled Nursing Facility1

## 2017-09-10 ENCOUNTER — Encounter: Payer: Self-pay | Admitting: Skilled Nursing Facility1

## 2017-09-10 DIAGNOSIS — E669 Obesity, unspecified: Secondary | ICD-10-CM | POA: Insufficient documentation

## 2017-09-10 DIAGNOSIS — Z9884 Bariatric surgery status: Secondary | ICD-10-CM | POA: Insufficient documentation

## 2017-09-10 DIAGNOSIS — Z713 Dietary counseling and surveillance: Secondary | ICD-10-CM | POA: Insufficient documentation

## 2017-09-10 NOTE — Progress Notes (Signed)
Post-Operative RYGB Surgery 2011  Primary concerns today: Post-operative Bariatric Surgery Nutrition Management.  Pt states she has Anxiety attacks. Pt states she has seen therapist before and they told her to put it in the lords hands.Pt has been going through a lot: Dog of 13 years has been lost for about a year, grandsons are on drugs one being paraplegic due to being thrown off a deck, daughter in law died from drugs. Pt states the anxiety attack pills do not help, Adopted her 13 year old Curator, son that lives with her seizures disorders with psychotic break which scared her. Pt states she sometimes thinks at night "lord take me on" (pt states she does not want to harm herself and commit suicide). Pt states she Does not take anything for underlying anxiety but does have something for in the moment panic attacks. Pt states most of the time she feels like she is in a cold/wet/dark abandon tobacco building with slatted wood so very little light is showing through feeling veery alone; stating she just wants peace.Pt states milk casues nausea. Pt states she has no internet access. Pt states she does not feel hunger or thrist. Pt states she is Sore and raw under her penniculus. Pt states no one in her house is kind to her and is consistently telling her all of things she does wrong.   24-hr recall: B (AM): skipped Snk (11AM): peanut butter crackers L (12-12:30PM): 3/4 [place] sandwhich Snk (2PM): 1/2 bag croutons  D (PM): half hamburger with cheese and mayonnaise  Snk (PM): vanilla yogurt from [employer] with granola  Fluid intake: coffee with cream and sugar, 8 oz sweet tea Estimated total protein intake: 60+  Medications: see list Supplementation: vitamin b12, multivitamin  Using straws: no Drinking while eating: no Having you been chewing well:no Chewing/swallowing difficulties: no Changes in vision: no Changes to mood/headaches: Hair loss/Cahnges to skin/Changes to nails:  Any  difficulty focusing or concentrating: no Sweating: no Dizziness/Lightheaded: yes; having fallen 2-3 times  Palpitations: no Carbonated beverages: no N/V/D/C/GAS: diarrhea 2-3 times a week, nausea every day, white foamy/slimy substance Abdominal Pain: no Dumping syndrome: no  Recent physical activity:  ADL's  Progress Towards Goal(s):  In progress.  Handouts given: Support group flyer Counselors sheet Crisis hot line card  Intervention:  Nutrition counseling. Dietitian educated the pt on getting the appropriate supplements and seeking help for her hopelessness.  Goals: -Ask for a referral from your doctor for a psychiatrist  -Get the celebrate capsule with iron multivitamin from [facility] -start taking 3 calcium a day: 2 hours a part and 2 hours from your multivitamin  -Aim for 4 bottles of water -Aim for decaff coffee and make it with splenda  -Aim eat something (cereal or toast and peanut butter) at 5am every morning  Teaching Method Utilized:  Visual Auditory Hands on  Barriers to learning/adherence to lifestyle change: extreme emotional distress  Demonstrated degree of understanding via:  Teach Back   Monitoring/Evaluation:  Dietary intake, exercise, and body weight.

## 2017-09-10 NOTE — Patient Instructions (Addendum)
-  Ask for a referral from your doctor for a psychiatrist   -Get the celebrate capsule with iron multivitamin from [facility]  -start taking 3 calcium a day: 2 hours a part and 2 hours from your multivitamin   -Aim for 4 bottles of water  -Aim for decaff coffee and make it with splenda   -Aim eat something (cereal or toast and peanut butter) at 5am every morning

## 2017-09-29 DIAGNOSIS — M5136 Other intervertebral disc degeneration, lumbar region: Secondary | ICD-10-CM | POA: Diagnosis not present

## 2017-09-29 DIAGNOSIS — K219 Gastro-esophageal reflux disease without esophagitis: Secondary | ICD-10-CM | POA: Diagnosis not present

## 2017-09-29 DIAGNOSIS — Z79899 Other long term (current) drug therapy: Secondary | ICD-10-CM | POA: Diagnosis not present

## 2017-09-29 DIAGNOSIS — M719 Bursopathy, unspecified: Secondary | ICD-10-CM | POA: Diagnosis not present

## 2017-09-29 DIAGNOSIS — M25559 Pain in unspecified hip: Secondary | ICD-10-CM | POA: Diagnosis not present

## 2017-10-13 ENCOUNTER — Ambulatory Visit: Payer: Medicare Other | Admitting: Skilled Nursing Facility1

## 2017-10-20 DIAGNOSIS — J40 Bronchitis, not specified as acute or chronic: Secondary | ICD-10-CM | POA: Diagnosis not present

## 2017-10-23 DIAGNOSIS — M25559 Pain in unspecified hip: Secondary | ICD-10-CM | POA: Diagnosis not present

## 2017-10-23 DIAGNOSIS — Z79899 Other long term (current) drug therapy: Secondary | ICD-10-CM | POA: Diagnosis not present

## 2017-11-19 ENCOUNTER — Encounter: Payer: Medicare Other | Attending: Surgery | Admitting: Skilled Nursing Facility1

## 2017-11-19 ENCOUNTER — Encounter: Payer: Self-pay | Admitting: Skilled Nursing Facility1

## 2017-11-19 DIAGNOSIS — E669 Obesity, unspecified: Secondary | ICD-10-CM | POA: Insufficient documentation

## 2017-11-19 DIAGNOSIS — Z713 Dietary counseling and surveillance: Secondary | ICD-10-CM | POA: Diagnosis not present

## 2017-11-19 DIAGNOSIS — Z9884 Bariatric surgery status: Secondary | ICD-10-CM | POA: Diagnosis not present

## 2017-11-19 NOTE — Progress Notes (Signed)
Post-Operative RYGB Surgery 2011  Primary concerns today: Post-operative Bariatric Surgery Nutrition Management.  Pt states she has Anxiety attacks.   Pt states her grandson passed away recently. Pt was crying.  Pt was making statements of  Not wanting to live, pt staets she needs help.  Pt Called therapeutic alternatives in the appointment for help they offered her 3 centers for mental health counseling her insurance may cover. Pt states her family being uncaring and feels it helps having a plan. Pt states she has not been eating or drinking much. Pt states she has become bowel incontinent sincethe past 3 or 4 months. Pt states I could abuse them (xanax) and take them all but I don't cuz it'll hurt me and I do not want to hurt. Pt states she feels much better and she is glad she came to her appointment today. Pt was given Protein shake coupons  24-hr recall: B (AM): skipped Snk (11AM): peanut butter crackers L (12-12:30PM): 3/4 [place] sandwhich Snk (2PM): 1/2 bag croutons  D (PM): half hamburger with cheese and mayonnaise  Snk (PM): vanilla yogurt from [employer] with granola  Fluid intake: coffee with cream and sugar, 8 oz sweet tea Estimated total protein intake: 60+  Medications: see list Supplementation: vitamin b12, multivitamin  Using straws: no Drinking while eating: no Having you been chewing well:no Chewing/swallowing difficulties: no Changes in vision: no Changes to mood/headaches: Hair loss/Cahnges to skin/Changes to nails:  Any difficulty focusing or concentrating: no Sweating: no Dizziness/Lightheaded: yes; having fallen 2-3 times  Palpitations: no Carbonated beverages: no N/V/D/C/GAS: diarrhea 2-3 times a week, nausea every day, white foamy/slimy substance Abdominal Pain: no Dumping syndrome: no  Recent physical activity:  ADL's  Progress Towards Goal(s):  In progress.  Handouts given: Support group flyer Counselors sheet Crisis hot line  card  Intervention:  Nutrition counseling. Dietitian educated the pt on getting the appropriate supplements and seeking help for her hopelessness.  Goals: -Tell your doctor about your incontinent  -Call the mental health centers to set up an appointment   Teaching Method Utilized:  Visual Auditory Hands on  Barriers to learning/adherence to lifestyle change: extreme emotional distress  Demonstrated degree of understanding via:  Teach Back   Monitoring/Evaluation:  Dietary intake, exercise, and body weight.

## 2017-11-20 DIAGNOSIS — Z79899 Other long term (current) drug therapy: Secondary | ICD-10-CM | POA: Diagnosis not present

## 2017-11-20 DIAGNOSIS — M25559 Pain in unspecified hip: Secondary | ICD-10-CM | POA: Diagnosis not present

## 2017-11-30 DIAGNOSIS — K219 Gastro-esophageal reflux disease without esophagitis: Secondary | ICD-10-CM | POA: Diagnosis not present

## 2017-11-30 DIAGNOSIS — I10 Essential (primary) hypertension: Secondary | ICD-10-CM | POA: Diagnosis not present

## 2017-11-30 DIAGNOSIS — Z1389 Encounter for screening for other disorder: Secondary | ICD-10-CM | POA: Diagnosis not present

## 2017-11-30 DIAGNOSIS — Z0000 Encounter for general adult medical examination without abnormal findings: Secondary | ICD-10-CM | POA: Diagnosis not present

## 2017-12-16 ENCOUNTER — Ambulatory Visit (HOSPITAL_COMMUNITY): Payer: Medicare Other | Admitting: Psychiatry

## 2017-12-28 DIAGNOSIS — J40 Bronchitis, not specified as acute or chronic: Secondary | ICD-10-CM | POA: Diagnosis not present

## 2018-01-01 DIAGNOSIS — M47892 Other spondylosis, cervical region: Secondary | ICD-10-CM | POA: Diagnosis not present

## 2018-01-01 DIAGNOSIS — M47812 Spondylosis without myelopathy or radiculopathy, cervical region: Secondary | ICD-10-CM | POA: Diagnosis not present

## 2018-01-01 DIAGNOSIS — Z79891 Long term (current) use of opiate analgesic: Secondary | ICD-10-CM | POA: Diagnosis not present

## 2018-01-01 DIAGNOSIS — M47816 Spondylosis without myelopathy or radiculopathy, lumbar region: Secondary | ICD-10-CM | POA: Diagnosis not present

## 2018-01-01 DIAGNOSIS — M5136 Other intervertebral disc degeneration, lumbar region: Secondary | ICD-10-CM | POA: Diagnosis not present

## 2018-01-01 DIAGNOSIS — M5030 Other cervical disc degeneration, unspecified cervical region: Secondary | ICD-10-CM | POA: Diagnosis not present

## 2018-01-01 DIAGNOSIS — M8588 Other specified disorders of bone density and structure, other site: Secondary | ICD-10-CM | POA: Diagnosis not present

## 2018-01-01 DIAGNOSIS — M545 Low back pain: Secondary | ICD-10-CM | POA: Diagnosis not present

## 2018-01-01 DIAGNOSIS — M47894 Other spondylosis, thoracic region: Secondary | ICD-10-CM | POA: Diagnosis not present

## 2018-01-01 DIAGNOSIS — M4126 Other idiopathic scoliosis, lumbar region: Secondary | ICD-10-CM | POA: Diagnosis not present

## 2018-01-01 DIAGNOSIS — M47896 Other spondylosis, lumbar region: Secondary | ICD-10-CM | POA: Diagnosis not present

## 2018-01-04 DIAGNOSIS — M5136 Other intervertebral disc degeneration, lumbar region: Secondary | ICD-10-CM | POA: Insufficient documentation

## 2018-01-04 DIAGNOSIS — Z79891 Long term (current) use of opiate analgesic: Secondary | ICD-10-CM | POA: Insufficient documentation

## 2018-01-04 DIAGNOSIS — M47816 Spondylosis without myelopathy or radiculopathy, lumbar region: Secondary | ICD-10-CM | POA: Insufficient documentation

## 2018-01-04 DIAGNOSIS — M5030 Other cervical disc degeneration, unspecified cervical region: Secondary | ICD-10-CM | POA: Insufficient documentation

## 2018-01-04 DIAGNOSIS — G8929 Other chronic pain: Secondary | ICD-10-CM | POA: Insufficient documentation

## 2018-01-04 DIAGNOSIS — M47812 Spondylosis without myelopathy or radiculopathy, cervical region: Secondary | ICD-10-CM | POA: Insufficient documentation

## 2018-01-04 DIAGNOSIS — M170 Bilateral primary osteoarthritis of knee: Secondary | ICD-10-CM | POA: Insufficient documentation

## 2018-01-04 DIAGNOSIS — M5459 Other low back pain: Secondary | ICD-10-CM | POA: Insufficient documentation

## 2018-01-04 DIAGNOSIS — G894 Chronic pain syndrome: Secondary | ICD-10-CM | POA: Insufficient documentation

## 2018-01-07 DIAGNOSIS — Z01419 Encounter for gynecological examination (general) (routine) without abnormal findings: Secondary | ICD-10-CM | POA: Diagnosis not present

## 2018-01-07 DIAGNOSIS — Z1231 Encounter for screening mammogram for malignant neoplasm of breast: Secondary | ICD-10-CM | POA: Diagnosis not present

## 2018-01-25 ENCOUNTER — Encounter: Payer: Self-pay | Admitting: *Deleted

## 2018-02-09 DIAGNOSIS — M5136 Other intervertebral disc degeneration, lumbar region: Secondary | ICD-10-CM | POA: Diagnosis not present

## 2018-02-09 DIAGNOSIS — M47816 Spondylosis without myelopathy or radiculopathy, lumbar region: Secondary | ICD-10-CM | POA: Diagnosis not present

## 2018-02-09 DIAGNOSIS — G894 Chronic pain syndrome: Secondary | ICD-10-CM | POA: Diagnosis not present

## 2018-02-09 DIAGNOSIS — I10 Essential (primary) hypertension: Secondary | ICD-10-CM | POA: Diagnosis not present

## 2018-02-09 DIAGNOSIS — M545 Low back pain: Secondary | ICD-10-CM | POA: Diagnosis not present

## 2018-02-12 ENCOUNTER — Ambulatory Visit: Payer: Medicare Other | Admitting: Cardiovascular Disease

## 2018-02-23 DIAGNOSIS — M85852 Other specified disorders of bone density and structure, left thigh: Secondary | ICD-10-CM | POA: Diagnosis not present

## 2018-02-23 DIAGNOSIS — M810 Age-related osteoporosis without current pathological fracture: Secondary | ICD-10-CM | POA: Diagnosis not present

## 2018-03-02 DIAGNOSIS — M797 Fibromyalgia: Secondary | ICD-10-CM | POA: Diagnosis not present

## 2018-03-02 DIAGNOSIS — I10 Essential (primary) hypertension: Secondary | ICD-10-CM | POA: Diagnosis not present

## 2018-03-02 DIAGNOSIS — R12 Heartburn: Secondary | ICD-10-CM | POA: Diagnosis not present

## 2018-03-19 ENCOUNTER — Ambulatory Visit: Payer: Medicare Other | Admitting: Cardiovascular Disease

## 2018-06-10 DIAGNOSIS — G894 Chronic pain syndrome: Secondary | ICD-10-CM | POA: Diagnosis not present

## 2018-06-10 DIAGNOSIS — M5136 Other intervertebral disc degeneration, lumbar region: Secondary | ICD-10-CM | POA: Diagnosis not present

## 2018-06-10 DIAGNOSIS — M47816 Spondylosis without myelopathy or radiculopathy, lumbar region: Secondary | ICD-10-CM | POA: Diagnosis not present

## 2018-06-10 DIAGNOSIS — M545 Low back pain: Secondary | ICD-10-CM | POA: Diagnosis not present

## 2018-06-16 DIAGNOSIS — Z0000 Encounter for general adult medical examination without abnormal findings: Secondary | ICD-10-CM | POA: Diagnosis not present

## 2018-06-16 DIAGNOSIS — I10 Essential (primary) hypertension: Secondary | ICD-10-CM | POA: Diagnosis not present

## 2018-06-16 DIAGNOSIS — M797 Fibromyalgia: Secondary | ICD-10-CM | POA: Diagnosis not present

## 2018-06-16 DIAGNOSIS — R12 Heartburn: Secondary | ICD-10-CM | POA: Diagnosis not present

## 2018-07-06 DIAGNOSIS — Z1231 Encounter for screening mammogram for malignant neoplasm of breast: Secondary | ICD-10-CM | POA: Diagnosis not present
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
## 2018-08-09 ENCOUNTER — Ambulatory Visit (HOSPITAL_COMMUNITY): Payer: Medicare Other | Admitting: Psychiatry

## 2018-09-10 DIAGNOSIS — M47812 Spondylosis without myelopathy or radiculopathy, cervical region: Secondary | ICD-10-CM | POA: Diagnosis not present

## 2018-09-10 DIAGNOSIS — M545 Low back pain: Secondary | ICD-10-CM | POA: Diagnosis not present

## 2018-09-10 DIAGNOSIS — M47816 Spondylosis without myelopathy or radiculopathy, lumbar region: Secondary | ICD-10-CM | POA: Diagnosis not present

## 2018-09-10 DIAGNOSIS — M5136 Other intervertebral disc degeneration, lumbar region: Secondary | ICD-10-CM | POA: Diagnosis not present

## 2018-09-10 DIAGNOSIS — G894 Chronic pain syndrome: Secondary | ICD-10-CM | POA: Diagnosis not present

## 2018-09-10 DIAGNOSIS — M5030 Other cervical disc degeneration, unspecified cervical region: Secondary | ICD-10-CM | POA: Diagnosis not present

## 2018-11-29 DIAGNOSIS — S299XXA Unspecified injury of thorax, initial encounter: Secondary | ICD-10-CM | POA: Diagnosis not present

## 2018-11-29 DIAGNOSIS — R079 Chest pain, unspecified: Secondary | ICD-10-CM | POA: Diagnosis not present

## 2018-11-29 DIAGNOSIS — G6289 Other specified polyneuropathies: Secondary | ICD-10-CM | POA: Diagnosis not present

## 2018-11-29 DIAGNOSIS — S0990XA Unspecified injury of head, initial encounter: Secondary | ICD-10-CM | POA: Diagnosis not present

## 2018-11-29 DIAGNOSIS — M545 Low back pain: Secondary | ICD-10-CM | POA: Diagnosis not present

## 2018-11-29 DIAGNOSIS — S0993XA Unspecified injury of face, initial encounter: Secondary | ICD-10-CM | POA: Diagnosis not present

## 2018-11-29 DIAGNOSIS — I10 Essential (primary) hypertension: Secondary | ICD-10-CM | POA: Diagnosis not present

## 2018-11-29 DIAGNOSIS — S27329A Contusion of lung, unspecified, initial encounter: Secondary | ICD-10-CM | POA: Diagnosis not present

## 2018-11-29 DIAGNOSIS — S20219A Contusion of unspecified front wall of thorax, initial encounter: Secondary | ICD-10-CM | POA: Diagnosis not present

## 2018-11-29 DIAGNOSIS — R911 Solitary pulmonary nodule: Secondary | ICD-10-CM | POA: Diagnosis not present

## 2018-11-29 DIAGNOSIS — K219 Gastro-esophageal reflux disease without esophagitis: Secondary | ICD-10-CM | POA: Diagnosis not present

## 2018-11-29 DIAGNOSIS — G8929 Other chronic pain: Secondary | ICD-10-CM | POA: Diagnosis not present

## 2018-11-29 DIAGNOSIS — S79912A Unspecified injury of left hip, initial encounter: Secondary | ICD-10-CM | POA: Diagnosis not present

## 2018-11-29 DIAGNOSIS — D649 Anemia, unspecified: Secondary | ICD-10-CM | POA: Diagnosis not present

## 2018-11-29 DIAGNOSIS — S79911A Unspecified injury of right hip, initial encounter: Secondary | ICD-10-CM | POA: Diagnosis not present

## 2018-11-29 DIAGNOSIS — S199XXA Unspecified injury of neck, initial encounter: Secondary | ICD-10-CM | POA: Diagnosis not present

## 2018-11-30 DIAGNOSIS — S8992XA Unspecified injury of left lower leg, initial encounter: Secondary | ICD-10-CM | POA: Diagnosis not present

## 2018-11-30 DIAGNOSIS — M25562 Pain in left knee: Secondary | ICD-10-CM | POA: Diagnosis not present

## 2018-11-30 DIAGNOSIS — M79662 Pain in left lower leg: Secondary | ICD-10-CM | POA: Diagnosis not present

## 2018-12-10 DIAGNOSIS — M170 Bilateral primary osteoarthritis of knee: Secondary | ICD-10-CM | POA: Diagnosis not present

## 2018-12-10 DIAGNOSIS — M545 Low back pain: Secondary | ICD-10-CM | POA: Diagnosis not present

## 2018-12-10 DIAGNOSIS — M47816 Spondylosis without myelopathy or radiculopathy, lumbar region: Secondary | ICD-10-CM | POA: Diagnosis not present

## 2018-12-10 DIAGNOSIS — M47812 Spondylosis without myelopathy or radiculopathy, cervical region: Secondary | ICD-10-CM | POA: Diagnosis not present

## 2018-12-10 DIAGNOSIS — R2242 Localized swelling, mass and lump, left lower limb: Secondary | ICD-10-CM | POA: Diagnosis not present

## 2018-12-10 DIAGNOSIS — L02416 Cutaneous abscess of left lower limb: Secondary | ICD-10-CM | POA: Diagnosis not present

## 2018-12-10 DIAGNOSIS — G894 Chronic pain syndrome: Secondary | ICD-10-CM | POA: Diagnosis not present

## 2018-12-10 DIAGNOSIS — Z79891 Long term (current) use of opiate analgesic: Secondary | ICD-10-CM | POA: Diagnosis not present

## 2018-12-10 DIAGNOSIS — M5136 Other intervertebral disc degeneration, lumbar region: Secondary | ICD-10-CM | POA: Diagnosis not present

## 2018-12-14 DIAGNOSIS — L02416 Cutaneous abscess of left lower limb: Secondary | ICD-10-CM | POA: Diagnosis not present

## 2018-12-14 DIAGNOSIS — S7012XD Contusion of left thigh, subsequent encounter: Secondary | ICD-10-CM | POA: Diagnosis not present

## 2018-12-14 DIAGNOSIS — Z4801 Encounter for change or removal of surgical wound dressing: Secondary | ICD-10-CM | POA: Diagnosis not present

## 2018-12-16 DIAGNOSIS — Z4800 Encounter for change or removal of nonsurgical wound dressing: Secondary | ICD-10-CM | POA: Diagnosis not present

## 2018-12-20 DIAGNOSIS — M79652 Pain in left thigh: Secondary | ICD-10-CM | POA: Diagnosis not present

## 2018-12-20 DIAGNOSIS — R51 Headache: Secondary | ICD-10-CM | POA: Diagnosis not present

## 2018-12-20 DIAGNOSIS — Z4800 Encounter for change or removal of nonsurgical wound dressing: Secondary | ICD-10-CM | POA: Diagnosis not present

## 2018-12-20 DIAGNOSIS — Z7982 Long term (current) use of aspirin: Secondary | ICD-10-CM | POA: Diagnosis not present

## 2018-12-20 DIAGNOSIS — Z79899 Other long term (current) drug therapy: Secondary | ICD-10-CM | POA: Diagnosis not present

## 2018-12-23 DIAGNOSIS — Z7982 Long term (current) use of aspirin: Secondary | ICD-10-CM | POA: Diagnosis not present

## 2018-12-23 DIAGNOSIS — Z4800 Encounter for change or removal of nonsurgical wound dressing: Secondary | ICD-10-CM | POA: Diagnosis not present

## 2018-12-23 DIAGNOSIS — L02416 Cutaneous abscess of left lower limb: Secondary | ICD-10-CM | POA: Diagnosis not present

## 2018-12-23 DIAGNOSIS — Z79899 Other long term (current) drug therapy: Secondary | ICD-10-CM | POA: Diagnosis not present

## 2018-12-24 DIAGNOSIS — Z4800 Encounter for change or removal of nonsurgical wound dressing: Secondary | ICD-10-CM | POA: Diagnosis not present

## 2018-12-25 DIAGNOSIS — Z4800 Encounter for change or removal of nonsurgical wound dressing: Secondary | ICD-10-CM | POA: Diagnosis not present

## 2018-12-25 DIAGNOSIS — Z4801 Encounter for change or removal of surgical wound dressing: Secondary | ICD-10-CM | POA: Diagnosis not present

## 2018-12-25 DIAGNOSIS — L02416 Cutaneous abscess of left lower limb: Secondary | ICD-10-CM | POA: Diagnosis not present

## 2018-12-27 DIAGNOSIS — L02416 Cutaneous abscess of left lower limb: Secondary | ICD-10-CM | POA: Diagnosis not present

## 2018-12-27 DIAGNOSIS — Z4800 Encounter for change or removal of nonsurgical wound dressing: Secondary | ICD-10-CM | POA: Diagnosis not present

## 2018-12-29 DIAGNOSIS — Z4800 Encounter for change or removal of nonsurgical wound dressing: Secondary | ICD-10-CM | POA: Diagnosis not present

## 2018-12-29 DIAGNOSIS — S71102D Unspecified open wound, left thigh, subsequent encounter: Secondary | ICD-10-CM | POA: Diagnosis not present

## 2018-12-29 DIAGNOSIS — R112 Nausea with vomiting, unspecified: Secondary | ICD-10-CM | POA: Diagnosis not present

## 2018-12-29 DIAGNOSIS — X58XXXA Exposure to other specified factors, initial encounter: Secondary | ICD-10-CM | POA: Diagnosis not present

## 2018-12-29 DIAGNOSIS — S71102A Unspecified open wound, left thigh, initial encounter: Secondary | ICD-10-CM | POA: Diagnosis not present

## 2018-12-30 DIAGNOSIS — L02416 Cutaneous abscess of left lower limb: Secondary | ICD-10-CM | POA: Diagnosis not present

## 2018-12-30 DIAGNOSIS — I10 Essential (primary) hypertension: Secondary | ICD-10-CM | POA: Diagnosis not present

## 2018-12-30 DIAGNOSIS — Z4800 Encounter for change or removal of nonsurgical wound dressing: Secondary | ICD-10-CM | POA: Diagnosis not present

## 2018-12-30 DIAGNOSIS — Z4801 Encounter for change or removal of surgical wound dressing: Secondary | ICD-10-CM | POA: Diagnosis not present

## 2018-12-31 DIAGNOSIS — Z4801 Encounter for change or removal of surgical wound dressing: Secondary | ICD-10-CM | POA: Diagnosis not present

## 2018-12-31 DIAGNOSIS — L02416 Cutaneous abscess of left lower limb: Secondary | ICD-10-CM | POA: Diagnosis not present

## 2018-12-31 DIAGNOSIS — Z7982 Long term (current) use of aspirin: Secondary | ICD-10-CM | POA: Diagnosis not present

## 2018-12-31 DIAGNOSIS — Z79899 Other long term (current) drug therapy: Secondary | ICD-10-CM | POA: Diagnosis not present

## 2018-12-31 DIAGNOSIS — I10 Essential (primary) hypertension: Secondary | ICD-10-CM | POA: Diagnosis not present

## 2018-12-31 DIAGNOSIS — F419 Anxiety disorder, unspecified: Secondary | ICD-10-CM | POA: Diagnosis not present

## 2019-01-02 DIAGNOSIS — Z4801 Encounter for change or removal of surgical wound dressing: Secondary | ICD-10-CM | POA: Diagnosis not present

## 2019-01-04 DIAGNOSIS — M25552 Pain in left hip: Secondary | ICD-10-CM | POA: Diagnosis not present

## 2019-01-04 DIAGNOSIS — M5136 Other intervertebral disc degeneration, lumbar region: Secondary | ICD-10-CM | POA: Diagnosis not present

## 2019-01-04 DIAGNOSIS — Z79899 Other long term (current) drug therapy: Secondary | ICD-10-CM | POA: Diagnosis not present

## 2019-01-05 DIAGNOSIS — Z4800 Encounter for change or removal of nonsurgical wound dressing: Secondary | ICD-10-CM | POA: Diagnosis not present

## 2019-01-05 DIAGNOSIS — L02416 Cutaneous abscess of left lower limb: Secondary | ICD-10-CM | POA: Diagnosis not present

## 2019-01-07 DIAGNOSIS — Z79899 Other long term (current) drug therapy: Secondary | ICD-10-CM | POA: Diagnosis not present

## 2019-01-07 DIAGNOSIS — L02416 Cutaneous abscess of left lower limb: Secondary | ICD-10-CM | POA: Diagnosis not present

## 2019-01-07 DIAGNOSIS — F419 Anxiety disorder, unspecified: Secondary | ICD-10-CM | POA: Diagnosis not present

## 2019-01-07 DIAGNOSIS — B9789 Other viral agents as the cause of diseases classified elsewhere: Secondary | ICD-10-CM | POA: Diagnosis not present

## 2019-01-07 DIAGNOSIS — J069 Acute upper respiratory infection, unspecified: Secondary | ICD-10-CM | POA: Diagnosis not present

## 2019-01-07 DIAGNOSIS — R05 Cough: Secondary | ICD-10-CM | POA: Diagnosis not present

## 2019-01-07 DIAGNOSIS — Z885 Allergy status to narcotic agent status: Secondary | ICD-10-CM | POA: Diagnosis not present

## 2019-01-07 DIAGNOSIS — I10 Essential (primary) hypertension: Secondary | ICD-10-CM | POA: Diagnosis not present

## 2019-01-07 DIAGNOSIS — Z7982 Long term (current) use of aspirin: Secondary | ICD-10-CM | POA: Diagnosis not present

## 2019-01-07 DIAGNOSIS — Z4801 Encounter for change or removal of surgical wound dressing: Secondary | ICD-10-CM | POA: Diagnosis not present

## 2019-01-08 DIAGNOSIS — Z885 Allergy status to narcotic agent status: Secondary | ICD-10-CM | POA: Diagnosis not present

## 2019-01-08 DIAGNOSIS — I10 Essential (primary) hypertension: Secondary | ICD-10-CM | POA: Diagnosis not present

## 2019-01-08 DIAGNOSIS — R05 Cough: Secondary | ICD-10-CM | POA: Diagnosis not present

## 2019-01-08 DIAGNOSIS — Z79899 Other long term (current) drug therapy: Secondary | ICD-10-CM | POA: Diagnosis not present

## 2019-01-08 DIAGNOSIS — F419 Anxiety disorder, unspecified: Secondary | ICD-10-CM | POA: Diagnosis not present

## 2019-01-08 DIAGNOSIS — J0110 Acute frontal sinusitis, unspecified: Secondary | ICD-10-CM | POA: Diagnosis not present

## 2019-01-08 DIAGNOSIS — Z9049 Acquired absence of other specified parts of digestive tract: Secondary | ICD-10-CM | POA: Diagnosis not present

## 2019-01-08 DIAGNOSIS — Z9884 Bariatric surgery status: Secondary | ICD-10-CM | POA: Diagnosis not present

## 2019-01-08 DIAGNOSIS — Z888 Allergy status to other drugs, medicaments and biological substances status: Secondary | ICD-10-CM | POA: Diagnosis not present

## 2019-01-08 DIAGNOSIS — Z7982 Long term (current) use of aspirin: Secondary | ICD-10-CM | POA: Diagnosis not present

## 2019-01-10 DIAGNOSIS — Z885 Allergy status to narcotic agent status: Secondary | ICD-10-CM | POA: Diagnosis not present

## 2019-01-10 DIAGNOSIS — Z888 Allergy status to other drugs, medicaments and biological substances status: Secondary | ICD-10-CM | POA: Diagnosis not present

## 2019-01-10 DIAGNOSIS — Z9884 Bariatric surgery status: Secondary | ICD-10-CM | POA: Diagnosis not present

## 2019-01-10 DIAGNOSIS — Z4801 Encounter for change or removal of surgical wound dressing: Secondary | ICD-10-CM | POA: Diagnosis not present

## 2019-01-10 DIAGNOSIS — Z7982 Long term (current) use of aspirin: Secondary | ICD-10-CM | POA: Diagnosis not present

## 2019-01-10 DIAGNOSIS — I10 Essential (primary) hypertension: Secondary | ICD-10-CM | POA: Diagnosis not present

## 2019-01-10 DIAGNOSIS — J069 Acute upper respiratory infection, unspecified: Secondary | ICD-10-CM | POA: Diagnosis not present

## 2019-01-10 DIAGNOSIS — F419 Anxiety disorder, unspecified: Secondary | ICD-10-CM | POA: Diagnosis not present

## 2019-01-10 DIAGNOSIS — Z79899 Other long term (current) drug therapy: Secondary | ICD-10-CM | POA: Diagnosis not present

## 2019-01-13 DIAGNOSIS — F419 Anxiety disorder, unspecified: Secondary | ICD-10-CM | POA: Diagnosis not present

## 2019-01-13 DIAGNOSIS — Z7982 Long term (current) use of aspirin: Secondary | ICD-10-CM | POA: Diagnosis not present

## 2019-01-13 DIAGNOSIS — R05 Cough: Secondary | ICD-10-CM | POA: Diagnosis not present

## 2019-01-13 DIAGNOSIS — Z79899 Other long term (current) drug therapy: Secondary | ICD-10-CM | POA: Diagnosis not present

## 2019-01-13 DIAGNOSIS — Z4800 Encounter for change or removal of nonsurgical wound dressing: Secondary | ICD-10-CM | POA: Diagnosis not present

## 2019-01-13 DIAGNOSIS — R5383 Other fatigue: Secondary | ICD-10-CM | POA: Diagnosis not present

## 2019-01-13 DIAGNOSIS — I10 Essential (primary) hypertension: Secondary | ICD-10-CM | POA: Diagnosis not present

## 2019-01-18 DIAGNOSIS — Z79899 Other long term (current) drug therapy: Secondary | ICD-10-CM | POA: Diagnosis not present

## 2019-01-18 DIAGNOSIS — L02416 Cutaneous abscess of left lower limb: Secondary | ICD-10-CM | POA: Diagnosis not present

## 2019-01-18 DIAGNOSIS — I10 Essential (primary) hypertension: Secondary | ICD-10-CM | POA: Diagnosis not present

## 2019-01-25 DIAGNOSIS — R102 Pelvic and perineal pain: Secondary | ICD-10-CM | POA: Diagnosis not present

## 2019-02-01 DIAGNOSIS — M539 Dorsopathy, unspecified: Secondary | ICD-10-CM | POA: Diagnosis not present

## 2019-02-01 DIAGNOSIS — F419 Anxiety disorder, unspecified: Secondary | ICD-10-CM | POA: Diagnosis not present

## 2019-02-01 DIAGNOSIS — G894 Chronic pain syndrome: Secondary | ICD-10-CM | POA: Diagnosis not present

## 2019-02-01 DIAGNOSIS — E559 Vitamin D deficiency, unspecified: Secondary | ICD-10-CM | POA: Diagnosis not present

## 2019-02-01 DIAGNOSIS — Z79899 Other long term (current) drug therapy: Secondary | ICD-10-CM | POA: Diagnosis not present

## 2019-02-01 DIAGNOSIS — M129 Arthropathy, unspecified: Secondary | ICD-10-CM | POA: Diagnosis not present

## 2019-02-01 DIAGNOSIS — I10 Essential (primary) hypertension: Secondary | ICD-10-CM | POA: Diagnosis not present

## 2019-02-03 DIAGNOSIS — M25559 Pain in unspecified hip: Secondary | ICD-10-CM | POA: Diagnosis not present

## 2019-02-03 DIAGNOSIS — M5136 Other intervertebral disc degeneration, lumbar region: Secondary | ICD-10-CM | POA: Diagnosis not present

## 2019-02-07 DIAGNOSIS — N83291 Other ovarian cyst, right side: Secondary | ICD-10-CM | POA: Diagnosis not present

## 2019-02-07 DIAGNOSIS — N83201 Unspecified ovarian cyst, right side: Secondary | ICD-10-CM | POA: Diagnosis not present

## 2019-02-07 DIAGNOSIS — Z90710 Acquired absence of both cervix and uterus: Secondary | ICD-10-CM | POA: Diagnosis not present

## 2019-02-07 DIAGNOSIS — R102 Pelvic and perineal pain: Secondary | ICD-10-CM | POA: Diagnosis not present

## 2019-03-03 DIAGNOSIS — Z79899 Other long term (current) drug therapy: Secondary | ICD-10-CM | POA: Diagnosis not present

## 2019-03-03 DIAGNOSIS — M5136 Other intervertebral disc degeneration, lumbar region: Secondary | ICD-10-CM | POA: Diagnosis not present

## 2019-03-03 DIAGNOSIS — I10 Essential (primary) hypertension: Secondary | ICD-10-CM | POA: Diagnosis not present

## 2019-03-03 DIAGNOSIS — M25559 Pain in unspecified hip: Secondary | ICD-10-CM | POA: Diagnosis not present

## 2019-03-03 DIAGNOSIS — M539 Dorsopathy, unspecified: Secondary | ICD-10-CM | POA: Diagnosis not present

## 2019-03-10 DIAGNOSIS — M5136 Other intervertebral disc degeneration, lumbar region: Secondary | ICD-10-CM | POA: Diagnosis not present

## 2019-03-10 DIAGNOSIS — M170 Bilateral primary osteoarthritis of knee: Secondary | ICD-10-CM | POA: Diagnosis not present

## 2019-03-10 DIAGNOSIS — M545 Low back pain: Secondary | ICD-10-CM | POA: Diagnosis not present

## 2019-03-10 DIAGNOSIS — M47816 Spondylosis without myelopathy or radiculopathy, lumbar region: Secondary | ICD-10-CM | POA: Diagnosis not present

## 2019-03-10 DIAGNOSIS — G894 Chronic pain syndrome: Secondary | ICD-10-CM | POA: Diagnosis not present

## 2019-03-11 DIAGNOSIS — D649 Anemia, unspecified: Secondary | ICD-10-CM | POA: Diagnosis not present

## 2019-03-11 DIAGNOSIS — R159 Full incontinence of feces: Secondary | ICD-10-CM | POA: Diagnosis not present

## 2019-03-11 DIAGNOSIS — K529 Noninfective gastroenteritis and colitis, unspecified: Secondary | ICD-10-CM | POA: Diagnosis not present

## 2019-03-11 DIAGNOSIS — R945 Abnormal results of liver function studies: Secondary | ICD-10-CM | POA: Diagnosis not present

## 2019-03-17 DIAGNOSIS — R5383 Other fatigue: Secondary | ICD-10-CM | POA: Diagnosis not present

## 2019-03-17 DIAGNOSIS — R0602 Shortness of breath: Secondary | ICD-10-CM | POA: Diagnosis not present

## 2019-03-17 DIAGNOSIS — Z1331 Encounter for screening for depression: Secondary | ICD-10-CM | POA: Diagnosis not present

## 2019-03-17 DIAGNOSIS — Z6837 Body mass index (BMI) 37.0-37.9, adult: Secondary | ICD-10-CM | POA: Diagnosis not present

## 2019-03-17 DIAGNOSIS — Z0000 Encounter for general adult medical examination without abnormal findings: Secondary | ICD-10-CM | POA: Diagnosis not present

## 2019-03-17 DIAGNOSIS — E7800 Pure hypercholesterolemia, unspecified: Secondary | ICD-10-CM | POA: Diagnosis not present

## 2019-03-17 DIAGNOSIS — Z79899 Other long term (current) drug therapy: Secondary | ICD-10-CM | POA: Diagnosis not present

## 2019-03-17 DIAGNOSIS — E669 Obesity, unspecified: Secondary | ICD-10-CM | POA: Diagnosis not present

## 2019-03-17 DIAGNOSIS — I10 Essential (primary) hypertension: Secondary | ICD-10-CM | POA: Diagnosis not present

## 2019-03-17 DIAGNOSIS — Z1339 Encounter for screening examination for other mental health and behavioral disorders: Secondary | ICD-10-CM | POA: Diagnosis not present
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
## 2019-03-22 ENCOUNTER — Encounter: Payer: Self-pay | Admitting: Cardiology

## 2019-03-22 ENCOUNTER — Telehealth (INDEPENDENT_AMBULATORY_CARE_PROVIDER_SITE_OTHER): Payer: Medicare HMO | Admitting: Cardiology

## 2019-03-22 VITALS — BP 147/84 | HR 72 | Ht 65.0 in | Wt 222.0 lb

## 2019-03-22 DIAGNOSIS — I10 Essential (primary) hypertension: Secondary | ICD-10-CM | POA: Diagnosis not present

## 2019-03-22 DIAGNOSIS — R072 Precordial pain: Secondary | ICD-10-CM | POA: Diagnosis not present

## 2019-03-22 DIAGNOSIS — I350 Nonrheumatic aortic (valve) stenosis: Secondary | ICD-10-CM | POA: Diagnosis not present

## 2019-03-22 DIAGNOSIS — R945 Abnormal results of liver function studies: Secondary | ICD-10-CM | POA: Diagnosis not present

## 2019-03-22 DIAGNOSIS — Z7189 Other specified counseling: Secondary | ICD-10-CM

## 2019-03-22 MED ORDER — ISOSORBIDE MONONITRATE ER 60 MG PO TB24: 60.0000 mg | ORAL_TABLET | Freq: Every day | ORAL | 1 refills | Status: DC

## 2019-03-22 MED ORDER — CARVEDILOL 12.5 MG PO TABS: 12.5000 mg | ORAL_TABLET | Freq: Two times a day (BID) | ORAL | 1 refills | Status: DC

## 2019-03-22 NOTE — Patient Instructions (Addendum)
Medication Instructions:   Your physician has recommended you make the following change in your medication:   Start imdur (isosorbide mononitrate) 60 mg by mouth daily  Increase carvedilol to 12.5 mg by mouth twice daily  Continue all other medications the same  Labwork:  NONE  Testing/Procedures:  NONE  Follow-Up:  Your physician recommends that you schedule a follow-up appointment in: 2 weeks with [place] for a virtual visit.  Any Other Special Instructions Will Be Listed Below (If Applicable).  If you need a refill on your cardiac medications before your next appointment, please call your pharmacy.

## 2019-03-31 DIAGNOSIS — M5136 Other intervertebral disc degeneration, lumbar region: Secondary | ICD-10-CM | POA: Diagnosis not present

## 2019-03-31 DIAGNOSIS — Z79899 Other long term (current) drug therapy: Secondary | ICD-10-CM | POA: Diagnosis not present

## 2019-03-31 DIAGNOSIS — M25559 Pain in unspecified hip: Secondary | ICD-10-CM | POA: Diagnosis not present

## 2019-03-31 DIAGNOSIS — I10 Essential (primary) hypertension: Secondary | ICD-10-CM | POA: Diagnosis not present

## 2019-03-31 DIAGNOSIS — M539 Dorsopathy, unspecified: Secondary | ICD-10-CM | POA: Diagnosis not present

## 2019-04-04 DIAGNOSIS — D509 Iron deficiency anemia, unspecified: Secondary | ICD-10-CM | POA: Diagnosis not present

## 2019-04-04 DIAGNOSIS — F339 Major depressive disorder, recurrent, unspecified: Secondary | ICD-10-CM | POA: Diagnosis not present

## 2019-04-04 DIAGNOSIS — R600 Localized edema: Secondary | ICD-10-CM | POA: Diagnosis not present

## 2019-04-04 DIAGNOSIS — Z1159 Encounter for screening for other viral diseases: Secondary | ICD-10-CM | POA: Diagnosis not present

## 2019-04-04 DIAGNOSIS — F419 Anxiety disorder, unspecified: Secondary | ICD-10-CM | POA: Diagnosis not present
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
## 2019-04-05 ENCOUNTER — Encounter: Payer: Self-pay | Admitting: Cardiology

## 2019-04-05 ENCOUNTER — Telehealth (INDEPENDENT_AMBULATORY_CARE_PROVIDER_SITE_OTHER): Payer: Medicare HMO | Admitting: Cardiology

## 2019-04-05 DIAGNOSIS — I6523 Occlusion and stenosis of bilateral carotid arteries: Secondary | ICD-10-CM

## 2019-04-05 DIAGNOSIS — I10 Essential (primary) hypertension: Secondary | ICD-10-CM

## 2019-04-05 DIAGNOSIS — R072 Precordial pain: Secondary | ICD-10-CM

## 2019-04-05 NOTE — Patient Instructions (Addendum)
Medication Instructions:  Continue current medications  If you need a refill on your cardiac medications before your next appointment, please call your pharmacy.  Labwork: None Ordered   Testing/Procedures: Your physician has requested that you have a carotid duplex at [facility]. This test is an ultrasound of the carotid arteries in your neck. It looks at blood flow through these arteries that supply the brain with blood. Allow one hour for this exam. There are no restrictions or special instructions.   Follow-Up: . Your physician recommends that you schedule a follow-up appointment in: As Needed   At [facility], you and your health needs are our priority.  As part of our continuing mission to provide you with exceptional heart care, we have created designated Provider Care Teams.  These Care Teams include your primary Cardiologist (physician) and Advanced Practice Providers (APPs -  Physician Assistants and Nurse Practitioners) who all work together to provide you with the care you need, when you need it.  Thank you for choosing [facility] at [facility]!!

## 2019-04-20 DIAGNOSIS — Z01818 Encounter for other preprocedural examination: Secondary | ICD-10-CM | POA: Diagnosis not present

## 2019-04-20 DIAGNOSIS — D649 Anemia, unspecified: Secondary | ICD-10-CM | POA: Diagnosis not present

## 2019-04-20 DIAGNOSIS — K529 Noninfective gastroenteritis and colitis, unspecified: Secondary | ICD-10-CM | POA: Diagnosis not present

## 2019-04-20 DIAGNOSIS — D509 Iron deficiency anemia, unspecified: Secondary | ICD-10-CM | POA: Diagnosis not present

## 2019-05-02 DIAGNOSIS — I10 Essential (primary) hypertension: Secondary | ICD-10-CM | POA: Diagnosis not present

## 2019-05-02 DIAGNOSIS — Z79899 Other long term (current) drug therapy: Secondary | ICD-10-CM | POA: Diagnosis not present

## 2019-05-02 DIAGNOSIS — M539 Dorsopathy, unspecified: Secondary | ICD-10-CM | POA: Diagnosis not present

## 2019-05-02 DIAGNOSIS — M25552 Pain in left hip: Secondary | ICD-10-CM | POA: Diagnosis not present

## 2019-05-02 DIAGNOSIS — M5136 Other intervertebral disc degeneration, lumbar region: Secondary | ICD-10-CM | POA: Diagnosis not present

## 2019-05-05 DIAGNOSIS — K2270 Barrett's esophagus without dysplasia: Secondary | ICD-10-CM | POA: Diagnosis not present

## 2019-05-06 DIAGNOSIS — K529 Noninfective gastroenteritis and colitis, unspecified: Secondary | ICD-10-CM | POA: Diagnosis not present

## 2019-05-11 ENCOUNTER — Ambulatory Visit (INDEPENDENT_AMBULATORY_CARE_PROVIDER_SITE_OTHER): Payer: Medicare HMO

## 2019-05-11 ENCOUNTER — Other Ambulatory Visit: Payer: Self-pay

## 2019-05-11 DIAGNOSIS — I6523 Occlusion and stenosis of bilateral carotid arteries: Secondary | ICD-10-CM | POA: Diagnosis not present

## 2019-05-16 ENCOUNTER — Telehealth: Payer: Self-pay | Admitting: *Deleted

## 2019-05-16 NOTE — Telephone Encounter (Signed)
Notes recorded by Minus Breeding, MD on 05/13/2019 at 8:51 AM EDT  Right Carotid: Velocities in the right ICA are consistent with a 1-39% stenosis.  Left Carotid: Velocities in the left ICA are consistent with a 1-39% stenosis.  Mild plaque. No follow up indicated at this time.   Call Ms. Fleeman with the results and send results to Center, [facility]

## 2019-05-16 NOTE — Telephone Encounter (Signed)
Patient called to the office and results of carotid given.

## 2019-11-29 DIAGNOSIS — E559 Vitamin D deficiency, unspecified: Secondary | ICD-10-CM | POA: Diagnosis not present

## 2019-11-29 DIAGNOSIS — Z79899 Other long term (current) drug therapy: Secondary | ICD-10-CM | POA: Diagnosis not present

## 2019-11-29 DIAGNOSIS — Z1159 Encounter for screening for other viral diseases: Secondary | ICD-10-CM | POA: Diagnosis not present

## 2019-11-29 DIAGNOSIS — F410 Panic disorder [episodic paroxysmal anxiety] without agoraphobia: Secondary | ICD-10-CM | POA: Diagnosis not present

## 2019-11-29 DIAGNOSIS — M5136 Other intervertebral disc degeneration, lumbar region: Secondary | ICD-10-CM | POA: Diagnosis not present

## 2019-11-29 DIAGNOSIS — M539 Dorsopathy, unspecified: Secondary | ICD-10-CM | POA: Diagnosis not present

## 2019-11-29 DIAGNOSIS — F411 Generalized anxiety disorder: Secondary | ICD-10-CM | POA: Diagnosis not present

## 2019-12-20 ENCOUNTER — Emergency Department (HOSPITAL_COMMUNITY): Payer: Medicare Other

## 2019-12-20 ENCOUNTER — Emergency Department (HOSPITAL_COMMUNITY)
Admission: EM | Admit: 2019-12-20 | Discharge: 2019-12-20 | Disposition: A | Discharge status: Home / Self Care | Payer: Medicare Other | Attending: Emergency Medicine | Admitting: Emergency Medicine

## 2019-12-20 ENCOUNTER — Other Ambulatory Visit: Payer: Self-pay

## 2019-12-20 ENCOUNTER — Encounter (HOSPITAL_COMMUNITY): Payer: Self-pay | Admitting: Emergency Medicine

## 2019-12-20 DIAGNOSIS — R42 Dizziness and giddiness: Secondary | ICD-10-CM | POA: Diagnosis not present

## 2019-12-20 DIAGNOSIS — I119 Hypertensive heart disease without heart failure: Secondary | ICD-10-CM | POA: Diagnosis not present

## 2019-12-20 DIAGNOSIS — Z79899 Other long term (current) drug therapy: Secondary | ICD-10-CM | POA: Diagnosis not present

## 2019-12-20 DIAGNOSIS — D509 Iron deficiency anemia, unspecified: Secondary | ICD-10-CM | POA: Diagnosis not present

## 2019-12-20 DIAGNOSIS — R200 Anesthesia of skin: Secondary | ICD-10-CM | POA: Insufficient documentation

## 2019-12-20 DIAGNOSIS — I2510 Atherosclerotic heart disease of native coronary artery without angina pectoris: Secondary | ICD-10-CM | POA: Diagnosis not present

## 2019-12-20 DIAGNOSIS — R531 Weakness: Secondary | ICD-10-CM | POA: Insufficient documentation

## 2019-12-20 DIAGNOSIS — R0789 Other chest pain: Secondary | ICD-10-CM | POA: Insufficient documentation

## 2019-12-20 DIAGNOSIS — R5383 Other fatigue: Secondary | ICD-10-CM | POA: Diagnosis not present

## 2019-12-20 DIAGNOSIS — R270 Ataxia, unspecified: Secondary | ICD-10-CM | POA: Diagnosis not present

## 2019-12-20 LAB — COMPREHENSIVE METABOLIC PANEL
ALT: 18 U/L (ref 0–44)
AST: 32 U/L (ref 15–41)
Albumin: 3.4 g/dL — ABNORMAL LOW (ref 3.5–5.0)
Alkaline Phosphatase: 129 U/L — ABNORMAL HIGH (ref 38–126)
Anion gap: 7 (ref 5–15)
BUN: 15 mg/dL (ref 8–23)
CO2: 27 mmol/L (ref 22–32)
Calcium: 8.8 mg/dL — ABNORMAL LOW (ref 8.9–10.3)
Chloride: 108 mmol/L (ref 98–111)
Creatinine, Ser: 0.96 mg/dL (ref 0.44–1.00)
GFR calc Af Amer: >60 mL/min (ref >60)
GFR calc non Af Amer: >60 mL/min (ref >60)
Glucose, Bld: 97 mg/dL (ref 70–99)
Potassium: 3.9 mmol/L (ref 3.5–5.1)
Sodium: 142 mmol/L (ref 135–145)
Total Bilirubin: 0.5 mg/dL (ref 0.3–1.2)
Total Protein: 6.4 g/dL — ABNORMAL LOW (ref 6.5–8.1)

## 2019-12-20 LAB — CBC WITH DIFFERENTIAL/PLATELET
Abs Immature Granulocytes: 0.01 10*3/uL (ref 0.00–0.07)
Basophils Absolute: 0.1 10*3/uL (ref 0.0–0.1)
Basophils Relative: 1 %
Eosinophils Absolute: 0.2 10*3/uL (ref 0.0–0.5)
Eosinophils Relative: 5 %
HCT: 30.9 % — ABNORMAL LOW (ref 36.0–46.0)
Hemoglobin: 8.6 g/dL — ABNORMAL LOW (ref 12.0–15.0)
Immature Granulocytes: 0 %
Lymphocytes Relative: 26 %
Lymphs Abs: 1.1 10*3/uL (ref 0.7–4.0)
MCH: 20 pg — ABNORMAL LOW (ref 26.0–34.0)
MCHC: 27.8 g/dL — ABNORMAL LOW (ref 30.0–36.0)
MCV: 72 fL — ABNORMAL LOW (ref 80.0–100.0)
Monocytes Absolute: 0.3 10*3/uL (ref 0.1–1.0)
Monocytes Relative: 7 %
Neutro Abs: 2.5 10*3/uL (ref 1.7–7.7)
Neutrophils Relative %: 61 %
Platelets: 175 10*3/uL (ref 150–400)
RBC: 4.29 MIL/uL (ref 3.87–5.11)
RDW: 19.2 % — ABNORMAL HIGH (ref 11.5–15.5)
WBC: 4.3 10*3/uL (ref 4.0–10.5)
nRBC: 0 % (ref 0.0–0.2)

## 2019-12-20 LAB — TROPONIN I (HIGH SENSITIVITY)
Troponin I (High Sensitivity): 3 ng/L (ref <18)
Troponin I (High Sensitivity): 3 ng/L (ref <18)

## 2019-12-20 MED ORDER — SODIUM CHLORIDE 0.9 % IV BOLUS
1000.0000 mL | Freq: Once | INTRAVENOUS | Status: AC
  Administered 2019-12-20: 1000 mL via INTRAVENOUS

## 2019-12-20 MED ORDER — FERROUS SULFATE 325 (65 FE) MG PO TABS: 325.0000 mg | ORAL_TABLET | Freq: Every day | ORAL | 0 refills | Status: DC

## 2019-12-20 MED ORDER — ACETAMINOPHEN 325 MG PO TABS
650.0000 mg | ORAL_TABLET | Freq: Once | ORAL | Status: AC
  Administered 2019-12-20: 650 mg via ORAL
  Filled 2019-12-20: qty 2

## 2019-12-20 NOTE — Discharge Instructions (Addendum)
Begin taking iron as prescribed today.  Follow-up with your primary doctor in the next week, and return to the ER if you develop worsening symptoms.

## 2019-12-20 NOTE — ED Triage Notes (Signed)
Pt reports dizziness and intermittent confusion x3 days. Pt alert and oriented. Facial symmetry noted. Pt able to move all extremities to command and ambulate with steady gait. Generalized weakness and decreased sensation on RUE. Pt tearful. Equal grips. No drift noted in extremities.
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
## 2019-12-30 DIAGNOSIS — Z79899 Other long term (current) drug therapy: Secondary | ICD-10-CM | POA: Diagnosis not present

## 2019-12-30 DIAGNOSIS — M5136 Other intervertebral disc degeneration, lumbar region: Secondary | ICD-10-CM | POA: Diagnosis not present

## 2019-12-30 DIAGNOSIS — M539 Dorsopathy, unspecified: Secondary | ICD-10-CM | POA: Diagnosis not present

## 2019-12-30 DIAGNOSIS — M25559 Pain in unspecified hip: Secondary | ICD-10-CM | POA: Diagnosis not present

## 2020-01-02 DIAGNOSIS — D509 Iron deficiency anemia, unspecified: Secondary | ICD-10-CM | POA: Diagnosis not present

## 2020-01-02 DIAGNOSIS — Z0189 Encounter for other specified special examinations: Secondary | ICD-10-CM | POA: Diagnosis not present

## 2020-01-02 DIAGNOSIS — G894 Chronic pain syndrome: Secondary | ICD-10-CM | POA: Diagnosis not present

## 2020-01-04 ENCOUNTER — Emergency Department (HOSPITAL_COMMUNITY): Payer: Medicare Other

## 2020-01-04 ENCOUNTER — Other Ambulatory Visit: Payer: Self-pay

## 2020-01-04 ENCOUNTER — Emergency Department (HOSPITAL_COMMUNITY)
Admission: EM | Admit: 2020-01-04 | Discharge: 2020-01-04 | Discharge status: Against Medical Advice | Payer: Medicare Other | Attending: Emergency Medicine | Admitting: Emergency Medicine

## 2020-01-04 DIAGNOSIS — R0789 Other chest pain: Secondary | ICD-10-CM | POA: Diagnosis not present

## 2020-01-04 DIAGNOSIS — I4891 Unspecified atrial fibrillation: Secondary | ICD-10-CM | POA: Diagnosis not present

## 2020-01-04 DIAGNOSIS — I2510 Atherosclerotic heart disease of native coronary artery without angina pectoris: Secondary | ICD-10-CM | POA: Diagnosis not present

## 2020-01-04 DIAGNOSIS — Z7982 Long term (current) use of aspirin: Secondary | ICD-10-CM | POA: Insufficient documentation

## 2020-01-04 DIAGNOSIS — M791 Myalgia, unspecified site: Secondary | ICD-10-CM | POA: Diagnosis not present

## 2020-01-04 DIAGNOSIS — R079 Chest pain, unspecified: Secondary | ICD-10-CM | POA: Diagnosis not present

## 2020-01-04 DIAGNOSIS — Z5320 Procedure and treatment not carried out because of patient's decision for unspecified reasons: Secondary | ICD-10-CM | POA: Diagnosis not present

## 2020-01-04 DIAGNOSIS — Z79899 Other long term (current) drug therapy: Secondary | ICD-10-CM | POA: Diagnosis not present

## 2020-01-04 DIAGNOSIS — I10 Essential (primary) hypertension: Secondary | ICD-10-CM | POA: Diagnosis not present

## 2020-01-04 DIAGNOSIS — Z743 Need for continuous supervision: Secondary | ICD-10-CM | POA: Diagnosis not present

## 2020-01-04 DIAGNOSIS — R0902 Hypoxemia: Secondary | ICD-10-CM | POA: Diagnosis not present

## 2020-01-04 LAB — BASIC METABOLIC PANEL
Anion gap: 7 (ref 5–15)
BUN: 15 mg/dL (ref 8–23)
CO2: 26 mmol/L (ref 22–32)
Calcium: 8.7 mg/dL — ABNORMAL LOW (ref 8.9–10.3)
Chloride: 109 mmol/L (ref 98–111)
Creatinine, Ser: 0.99 mg/dL (ref 0.44–1.00)
GFR calc Af Amer: >60 mL/min (ref >60)
GFR calc non Af Amer: 60 mL/min — ABNORMAL LOW (ref >60)
Glucose, Bld: 115 mg/dL — ABNORMAL HIGH (ref 70–99)
Potassium: 4.4 mmol/L (ref 3.5–5.1)
Sodium: 142 mmol/L (ref 135–145)

## 2020-01-04 LAB — CBC WITH DIFFERENTIAL/PLATELET
Abs Immature Granulocytes: 0.02 10*3/uL (ref 0.00–0.07)
Basophils Absolute: 0.1 10*3/uL (ref 0.0–0.1)
Basophils Relative: 1 %
Eosinophils Absolute: 0.2 10*3/uL (ref 0.0–0.5)
Eosinophils Relative: 4 %
HCT: 30.5 % — ABNORMAL LOW (ref 36.0–46.0)
Hemoglobin: 8.2 g/dL — ABNORMAL LOW (ref 12.0–15.0)
Immature Granulocytes: 0 %
Lymphocytes Relative: 21 %
Lymphs Abs: 1.2 10*3/uL (ref 0.7–4.0)
MCH: 19.7 pg — ABNORMAL LOW (ref 26.0–34.0)
MCHC: 26.9 g/dL — ABNORMAL LOW (ref 30.0–36.0)
MCV: 73.3 fL — ABNORMAL LOW (ref 80.0–100.0)
Monocytes Absolute: 0.4 10*3/uL (ref 0.1–1.0)
Monocytes Relative: 7 %
Neutro Abs: 3.9 10*3/uL (ref 1.7–7.7)
Neutrophils Relative %: 67 %
Platelets: 209 10*3/uL (ref 150–400)
RBC: 4.16 MIL/uL (ref 3.87–5.11)
RDW: 19.3 % — ABNORMAL HIGH (ref 11.5–15.5)
WBC: 5.9 10*3/uL (ref 4.0–10.5)
nRBC: 0 % (ref 0.0–0.2)

## 2020-01-04 LAB — TROPONIN I (HIGH SENSITIVITY): Troponin I (High Sensitivity): 3 ng/L (ref <18)

## 2020-01-04 MED ORDER — ACETAMINOPHEN 325 MG PO TABS
650.0000 mg | ORAL_TABLET | Freq: Once | ORAL | Status: AC
  Administered 2020-01-04: 650 mg via ORAL
  Filled 2020-01-04: qty 2

## 2020-01-04 NOTE — ED Triage Notes (Signed)
Chest pressure in center of chest. started at 1030 post COVID vaccine at 1000. Does not radiate. 7 of 10 on scene 3 nitros 4 81mg  pain down to 5. Accompanied by nausea and headache.
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
## 2020-01-09 ENCOUNTER — Other Ambulatory Visit: Payer: Self-pay | Admitting: Internal Medicine

## 2020-01-09 ENCOUNTER — Other Ambulatory Visit (HOSPITAL_COMMUNITY): Payer: Self-pay | Admitting: Internal Medicine

## 2020-01-09 DIAGNOSIS — M25552 Pain in left hip: Secondary | ICD-10-CM

## 2020-01-09 DIAGNOSIS — M79652 Pain in left thigh: Secondary | ICD-10-CM

## 2020-01-27 DIAGNOSIS — M25559 Pain in unspecified hip: Secondary | ICD-10-CM | POA: Diagnosis not present

## 2020-01-27 DIAGNOSIS — E039 Hypothyroidism, unspecified: Secondary | ICD-10-CM | POA: Diagnosis not present

## 2020-01-27 DIAGNOSIS — R7301 Impaired fasting glucose: Secondary | ICD-10-CM | POA: Diagnosis not present

## 2020-01-27 DIAGNOSIS — D509 Iron deficiency anemia, unspecified: Secondary | ICD-10-CM | POA: Diagnosis not present

## 2020-01-27 DIAGNOSIS — Z79899 Other long term (current) drug therapy: Secondary | ICD-10-CM | POA: Diagnosis not present

## 2020-01-27 DIAGNOSIS — M539 Dorsopathy, unspecified: Secondary | ICD-10-CM | POA: Diagnosis not present

## 2020-01-27 DIAGNOSIS — Z0000 Encounter for general adult medical examination without abnormal findings: Secondary | ICD-10-CM | POA: Diagnosis not present

## 2020-01-27 DIAGNOSIS — M5136 Other intervertebral disc degeneration, lumbar region: Secondary | ICD-10-CM | POA: Diagnosis not present

## 2020-02-03 ENCOUNTER — Ambulatory Visit (HOSPITAL_COMMUNITY): Payer: Medicare Other

## 2020-02-03 ENCOUNTER — Encounter (HOSPITAL_COMMUNITY): Payer: Self-pay

## 2020-02-03 DIAGNOSIS — R945 Abnormal results of liver function studies: Secondary | ICD-10-CM | POA: Diagnosis not present

## 2020-02-03 DIAGNOSIS — D509 Iron deficiency anemia, unspecified: Secondary | ICD-10-CM | POA: Diagnosis not present

## 2020-02-03 DIAGNOSIS — Z0001 Encounter for general adult medical examination with abnormal findings: Secondary | ICD-10-CM | POA: Diagnosis not present

## 2020-02-03 DIAGNOSIS — G894 Chronic pain syndrome: Secondary | ICD-10-CM | POA: Diagnosis not present

## 2020-02-07 ENCOUNTER — Other Ambulatory Visit: Payer: Self-pay | Admitting: Internal Medicine

## 2020-02-07 DIAGNOSIS — R7989 Other specified abnormal findings of blood chemistry: Secondary | ICD-10-CM

## 2020-02-10 ENCOUNTER — Other Ambulatory Visit: Payer: Self-pay | Admitting: *Deleted

## 2020-02-10 NOTE — Patient Outreach (Signed)
Initial telephone outreach in response from referral from Dr. Juel Burrow office.  No answer, left a message and requested a return call.  [name]. Myrtie Neither, MSN, [facility] Gerontological Nurse Practitioner [facility] Care Management [phone]

## 2020-02-13 ENCOUNTER — Other Ambulatory Visit: Payer: Self-pay | Admitting: *Deleted
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
## 2020-02-17 ENCOUNTER — Ambulatory Visit (HOSPITAL_COMMUNITY): Payer: Medicare Other

## 2020-02-23 ENCOUNTER — Encounter (HOSPITAL_COMMUNITY): Admission: RE | Admit: 2020-02-23 | Payer: Medicare Other | Source: Ambulatory Visit

## 2020-02-24 DIAGNOSIS — M5136 Other intervertebral disc degeneration, lumbar region: Secondary | ICD-10-CM | POA: Diagnosis not present

## 2020-02-24 DIAGNOSIS — M539 Dorsopathy, unspecified: Secondary | ICD-10-CM | POA: Diagnosis not present

## 2020-02-24 DIAGNOSIS — M25559 Pain in unspecified hip: Secondary | ICD-10-CM | POA: Diagnosis not present

## 2020-02-24 DIAGNOSIS — Z79899 Other long term (current) drug therapy: Secondary | ICD-10-CM | POA: Diagnosis not present

## 2020-03-01 ENCOUNTER — Encounter (HOSPITAL_COMMUNITY): Payer: Self-pay

## 2020-03-01 ENCOUNTER — Encounter (HOSPITAL_COMMUNITY)
Admission: RE | Admit: 2020-03-01 | Discharge: 2020-03-01 | Disposition: A | Discharge status: Home / Self Care | Payer: Medicare Other | Source: Ambulatory Visit | Attending: Internal Medicine | Admitting: Internal Medicine

## 2020-03-01 ENCOUNTER — Other Ambulatory Visit: Payer: Self-pay

## 2020-03-01 DIAGNOSIS — D509 Iron deficiency anemia, unspecified: Secondary | ICD-10-CM | POA: Insufficient documentation

## 2020-03-01 HISTORY — DX: Anemia, unspecified: D64.9

## 2020-03-01 MED ORDER — SODIUM CHLORIDE 0.9 % IV SOLN: Freq: Once | INTRAVENOUS | Status: AC

## 2020-03-01 MED ORDER — SODIUM CHLORIDE 0.9 % IV SOLN
510.0000 mg | Freq: Once | INTRAVENOUS | Status: AC
  Administered 2020-03-01: 510 mg via INTRAVENOUS
  Filled 2020-03-01: qty 17

## 2020-03-02 ENCOUNTER — Other Ambulatory Visit: Payer: Self-pay | Admitting: *Deleted

## 2020-03-02 NOTE — Patient Outreach (Signed)
[facility]) Care Management  03/02/2020  [place] 24-May-1956 [id number]   Telephone outreach: No answer, but left a message and also texted pt to return my call.  No return call today. I will call again next week.  Jasmine Pont. Myrtie Neither, MSN, [facility] Gerontological Nurse Practitioner [facility] Care Management [phone]

## 2020-03-05 ENCOUNTER — Other Ambulatory Visit: Payer: Self-pay | Admitting: *Deleted

## 2020-03-05 ENCOUNTER — Encounter: Payer: Self-pay | Admitting: *Deleted

## 2020-03-05 NOTE — Patient Outreach (Addendum)
[name]) Care Management  03/05/2020  [name] 06-Aug-1956 [id number]  Telephone outreach. Pt referred early this month for social work needs. Today, NP finished intitial eval. LSCW referral sent 03/02/20.  Today, I was able to speak to [name] long enough to complete her assessment but not long enough to develop a good plan of care. Most of the interventions needed will be provided by our LCSW.  I will address her depression. She has had a number of medications prescribed, however, she tells me she is on paxil or prozac but she didn't like the way these made her feel. Her current med list in Epic indicates she has citalopram and duloxetine. She was not able to review these with me today by looking at her med bottles.   Will communicate with assigned LCSW for collaboration. Will plan on doing a home visit within the next 2 weeks.  Current Outpatient Medications on File Prior to Visit  Medication Sig Dispense Refill  . ALPRAZolam (XANAX) 0.5 MG tablet Take 0.5 mg by mouth 2 (two) times daily.     Marland Kitchen amLODipine-benazepril (LOTREL) 10-20 MG per capsule Take 1 capsule by mouth daily.     . ARIPiprazole (ABILIFY) 2 MG tablet Take 2 mg by mouth daily.    Marland Kitchen aspirin EC 81 MG tablet Take 81 mg by mouth daily.    . carvedilol (COREG) 12.5 MG tablet Take 1 tablet (12.5 mg total) by mouth 2 (two) times daily. (Patient not taking: Reported on 12/20/2019) 180 tablet 1  . chlorthalidone (HYGROTON) 25 MG tablet Take 1 tablet (25 mg total) by mouth daily. (Patient not taking: Reported on 12/20/2019) 30 tablet 6  . citalopram (CELEXA) 20 MG tablet Take 20 mg by mouth daily.  0  . DULoxetine (CYMBALTA) 30 MG capsule Take 30 mg by mouth daily.  0  . ferrous sulfate 325 (65 FE) MG tablet Take 1 tablet (325 mg total) by mouth daily. 30 tablet 0  . furosemide (LASIX) 20 MG tablet Take 20 mg by mouth daily.  0  . gabapentin (NEURONTIN) 400 MG capsule Take 1 capsule by mouth daily.     .  isosorbide mononitrate (IMDUR) 60 MG 24 hr tablet Take 1 tablet (60 mg total) by mouth daily. (Patient not taking: Reported on 12/20/2019) 90 tablet 1  . Multiple Vitamin (MULTIVITAMIN WITH MINERALS) TABS Take 1 tablet by mouth daily.    . nitroGLYCERIN (NITROSTAT) 0.4 MG SL tablet Place 0.4 mg under the tongue every 5 (five) minutes as needed. For chest pain    . omeprazole (PRILOSEC) 40 MG capsule Take 1-2 capsules by mouth daily as needed.    . Oxycodone HCl 10 MG TABS Take 10 mg by mouth 4 (four) times daily as needed.    . solifenacin (VESICARE) 10 MG tablet Take 10 mg by mouth daily.    Marland Kitchen tiZANidine (ZANAFLEX) 4 MG tablet Take 1 tablet by mouth 3 (three) times daily as needed.    . vitamin B-12 (CYANOCOBALAMIN) 500 MCG tablet Take 500 mcg by mouth daily.     No current facility-administered medications on file prior to visit.   Fall Risk  03/05/2020 09/10/2017  Falls in the past year? 0 No  Number falls in past yr: 0 -  Injury with Fall? 0 -  Risk for fall due to : Impaired balance/gait;Medication side effect;Orthopedic patient -  Follow up Falls evaluation completed;Falls prevention discussed -   Depression screen [facility] 2/9 03/05/2020 09/10/2017  Decreased Interest 2 0  Down, Depressed, Hopeless 2 0  PHQ - 2 Score 4 0  Altered sleeping 1 -  Tired, decreased energy 3 -  Change in appetite 3 -  Feeling bad or failure about yourself  1 -  Trouble concentrating 1 -  Moving slowly or fidgety/restless 0 -  Suicidal thoughts 0 -  PHQ-9 Score 13 -  Difficult doing work/chores Somewhat difficult -   Functional Status Survey: Is the patient deaf or have difficulty hearing?: Yes Does the patient have difficulty seeing, even when wearing glasses/contacts?: No Does the patient have difficulty concentrating, remembering, or making decisions?: No Does the patient have difficulty walking or climbing stairs?: No(Not usually but has had injuries to R foot and L hip which cause some pain and  sometimes alter her gait.) Does the patient have difficulty dressing or bathing?: No Does the patient have difficulty doing errands alone such as visiting a doctor's office or shopping?: No  THN CM Care Plan Problem One     Most Recent Value  Care Plan Problem One  Depression  Role Documenting the Problem One  Care Management Coordinator  Care Plan for Problem One  Active  THN CM Short Term Goal #1   Pt to allow home visit for medication reconcilliation within the next 2 weeks.  THN CM Short Term Goal #1 Start Date  03/05/20     Jasmine Pont. Jasmine Neither, MSN, [facility] Gerontological Nurse Practitioner [place] [name] Care Management [phone]

## 2020-03-08 ENCOUNTER — Other Ambulatory Visit: Payer: Self-pay

## 2020-03-08 ENCOUNTER — Telehealth: Payer: Self-pay | Admitting: *Deleted

## 2020-03-08 ENCOUNTER — Encounter (HOSPITAL_COMMUNITY)
Admission: RE | Admit: 2020-03-08 | Discharge: 2020-03-08 | Disposition: A | Discharge status: Home / Self Care | Payer: Medicare Other | Source: Ambulatory Visit | Attending: Internal Medicine | Admitting: Internal Medicine

## 2020-03-08 ENCOUNTER — Encounter (HOSPITAL_COMMUNITY): Payer: Self-pay

## 2020-03-08 DIAGNOSIS — D509 Iron deficiency anemia, unspecified: Secondary | ICD-10-CM | POA: Diagnosis not present

## 2020-03-08 MED ORDER — SODIUM CHLORIDE 0.9 % IV SOLN: Freq: Once | INTRAVENOUS | Status: AC

## 2020-03-08 MED ORDER — SODIUM CHLORIDE 0.9 % IV SOLN
510.0000 mg | Freq: Once | INTRAVENOUS | Status: AC
  Administered 2020-03-08: 510 mg via INTRAVENOUS
  Filled 2020-03-08: qty 17

## 2020-03-08 NOTE — Patient Outreach (Signed)
[place] [facility]) Care Management  03/08/2020  Chereka Meggison [facility] 1956/07/05 [id number]

## 2020-03-09 ENCOUNTER — Ambulatory Visit (HOSPITAL_COMMUNITY)
Admission: RE | Admit: 2020-03-09 | Discharge: 2020-03-09 | Disposition: A | Discharge status: Home / Self Care | Payer: Medicare Other | Source: Ambulatory Visit | Attending: Internal Medicine | Admitting: Internal Medicine

## 2020-03-09 DIAGNOSIS — M79652 Pain in left thigh: Secondary | ICD-10-CM | POA: Diagnosis not present

## 2020-03-09 DIAGNOSIS — M25552 Pain in left hip: Secondary | ICD-10-CM | POA: Diagnosis not present

## 2020-03-09 DIAGNOSIS — S73192A Other sprain of left hip, initial encounter: Secondary | ICD-10-CM | POA: Diagnosis not present

## 2020-03-12 ENCOUNTER — Other Ambulatory Visit: Payer: Self-pay | Admitting: *Deleted

## 2020-03-12 ENCOUNTER — Telehealth: Payer: Self-pay | Admitting: *Deleted
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
## 2020-03-12 NOTE — Patient Outreach (Signed)
[place] [facility]) Care Management  03/12/2020  SHAKEITHA HERBIN Feb 08, 1956 [id number]  Called to advise will stop by on Wednesday this week to do a med check and provide a medication box. Requested a return call on pt voice mail. Also forwarded [employer] number and requested that pt call her.  Eulah Pont. Myrtie Neither, MSN, [facility] Gerontological Nurse Practitioner [facility] Care Management [phone]

## 2020-03-16 ENCOUNTER — Ambulatory Visit: Payer: Self-pay | Admitting: *Deleted

## 2020-03-20 ENCOUNTER — Ambulatory Visit: Payer: Self-pay | Admitting: *Deleted
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
## 2020-03-23 DIAGNOSIS — M25559 Pain in unspecified hip: Secondary | ICD-10-CM | POA: Diagnosis not present

## 2020-03-23 DIAGNOSIS — Z79899 Other long term (current) drug therapy: Secondary | ICD-10-CM | POA: Diagnosis not present

## 2020-03-23 DIAGNOSIS — M539 Dorsopathy, unspecified: Secondary | ICD-10-CM | POA: Diagnosis not present

## 2020-03-23 DIAGNOSIS — M5136 Other intervertebral disc degeneration, lumbar region: Secondary | ICD-10-CM | POA: Diagnosis not present

## 2020-03-28 ENCOUNTER — Other Ambulatory Visit: Payer: Self-pay | Admitting: *Deleted

## 2020-03-28 NOTE — Patient Outreach (Signed)
[place] [facility]) Care Management  03/28/2020  MARGUARITE HOTTEL 17-May-1956 [id number]   CSW attempted initial outreach to pt today and was unsuccessful. CSW left a HIPPA compliant voice message.  CSW will await a callback and attempt outreach again in 3-4 business days if no return call received.   Eduard Clos, MSW, [place] Worker  [place] [phone]

## 2020-03-29 ENCOUNTER — Other Ambulatory Visit: Payer: Self-pay | Admitting: *Deleted

## 2020-03-29 NOTE — Patient Outreach (Signed)
[name]) Care Management  03/29/2020  [name] Nov 21, 1955 [id number]   CSW attempted initial outreach to pt and was unsuccessful. CSW left a HIPPA compliant voice message and will mail pt an Unsuccessful Outreach letter and call back in 3-4 business days if no return call is received.   Eduard Clos, MSW, [place] Worker  [place] [phone]

## 2020-04-02 ENCOUNTER — Other Ambulatory Visit: Payer: Self-pay | Admitting: *Deleted

## 2020-04-02 NOTE — Patient Outreach (Signed)
[name] [facility]) Care Management  04/02/2020  [name] 01-20-1956 [id number]    CSW made contact with pt today by phone and confirmed her identity.  CSW introduced self, role and reason for call.  Per pt, she lives with her husband, 2 sons - one on disability(seizures) and 1 son trying to get a job and also 65yo grandchild that she has custody of.  Pt also has a daughter nearby; "I gave her a ride this morning to the clinic she has to go to trying to get clean".  Pt has had a lot of trauma, heartache, grief/loss as well as struggles from "helping everyone else and not myself".  Pt denies depression; does have a history of anxiety and takes RX for this. Pt expressed needing an outlet.  CSW mentioned possible on-going counseling may be beneficial.  She is open to CSW seeking providers in her area and planning a follow up call.   Eduard Clos, MSW, [place] Worker  [place] [phone]

## 2020-04-03 ENCOUNTER — Encounter: Payer: Self-pay | Admitting: *Deleted

## 2020-04-03 ENCOUNTER — Other Ambulatory Visit: Payer: Self-pay | Admitting: *Deleted

## 2020-04-03 NOTE — Patient Outreach (Signed)
[place] [facility]) Care Management  04/03/2020  [place] 16-Sep-1956 [id number]   Telephone outreach and case closure for CM only.  Mrs. Vazques reports she is doing fair. She says she has had an MRI on her hip and it revealed that she does have a small labral tear t the L hip joint that she may desire to have an orthopedic evaluation for.  She has made arrangements to go speak to a counselor at [facility] for her unresolved grief.  We discussed going to [place] and she says she has been and states she did not feel the provider there was right for her.  Advised she can also reach out to her [facility] insurance which does have behavior health benefits.  Advised NP will sign off since she now has contact with [facility] LCSW and will be receiving services from her.  Requested that she always take her medication with her when she goes to a provider so they can see what she is and is not taking. She reports that she did not like the way the Celexa made her feel and so she stopped it. This NP feels she does need to try another antidepressant that would be a good choice for her.  Will advise Dr. Nevada Crane this NP will close CM aspect of care. LCSW to continue for now.  Eulah Pont. Myrtie Neither, MSN, [facility] Gerontological Nurse Practitioner [facility] Care Management [phone]

## 2020-04-10 ENCOUNTER — Other Ambulatory Visit: Payer: Self-pay | Admitting: *Deleted

## 2020-04-12 ENCOUNTER — Other Ambulatory Visit: Payer: Self-pay | Admitting: *Deleted

## 2020-04-12 NOTE — Patient Outreach (Signed)
[facility]) Care Management  04/12/2020  [name] 1956-09-25 [id number]   CSW attempted to make contact with pt and was unsuccessful.  CSW was unable to reach pt and no voicemail.  CSW will try again in 3-4 business days if no return call received.   Eduard Clos, MSW, [place] Worker  [place] [phone]

## 2020-04-16 ENCOUNTER — Other Ambulatory Visit: Payer: Self-pay | Admitting: *Deleted

## 2020-04-16 ENCOUNTER — Ambulatory Visit: Payer: Self-pay | Admitting: *Deleted

## 2020-04-16 NOTE — Patient Outreach (Signed)
[place] [facility]) Care Management  04/16/2020  [name] [birth year] [id number]   CSW attempted a 2nd follow up outreach and was unsuccessful and pt's voicemail was full.  CSW will mail pt an Unsuccessful Outreach letter and attempt again in 3-4 business days per protocol.   Eduard Clos, MSW, [place] Worker  [place] [phone]

## 2020-04-20 ENCOUNTER — Ambulatory Visit: Payer: Self-pay | Admitting: *Deleted

## 2020-04-20 ENCOUNTER — Other Ambulatory Visit: Payer: Self-pay | Admitting: *Deleted

## 2020-04-20 DIAGNOSIS — M539 Dorsopathy, unspecified: Secondary | ICD-10-CM | POA: Diagnosis not present

## 2020-04-20 DIAGNOSIS — M5136 Other intervertebral disc degeneration, lumbar region: Secondary | ICD-10-CM | POA: Diagnosis not present

## 2020-04-20 DIAGNOSIS — Z79899 Other long term (current) drug therapy: Secondary | ICD-10-CM | POA: Diagnosis not present

## 2020-04-20 DIAGNOSIS — M25559 Pain in unspecified hip: Secondary | ICD-10-CM | POA: Diagnosis not present

## 2020-04-20 NOTE — Patient Outreach (Signed)
[place] [facility]) Care Management  04/20/2020  YENNY KOSA 1955-12-12 [id number]   CSW attempted another outreach call to pt and was unsuccessful. CSW was unable to leave a message due to voicemail being full.  CSW will plan case closure if no return call is received  within 10 days policy.   Eduard Clos, MSW, [place] Worker  [place] [phone]

## 2020-04-25 ENCOUNTER — Other Ambulatory Visit: Payer: Self-pay | Admitting: *Deleted

## 2020-04-26 NOTE — Patient Outreach (Signed)
[place] [facility]) Care Management  04/26/2020  TERICA YOGI 1956/11/07 [id number]   C[place] spoke with pt on 04/25/2020 by phone. Pt reports she has been dealing with some medical issues going on with family; "my husband's afib and COPD and my son had a bad seizure".  She reports everyone is ok and she has not had a chance to look into the counseling options.  CSW offered support as she has been juggling her family needs.  CSW offered to seek appointment for her in the [place] area and update her.  Eduard Clos, MSW, [place] Worker  [place] [phone]

## 2020-05-07 ENCOUNTER — Ambulatory Visit: Payer: Self-pay | Admitting: *Deleted

## 2020-05-10 ENCOUNTER — Other Ambulatory Visit: Payer: Self-pay | Admitting: *Deleted

## 2020-05-10 DIAGNOSIS — Z0000 Encounter for general adult medical examination without abnormal findings: Secondary | ICD-10-CM | POA: Diagnosis not present

## 2020-05-10 DIAGNOSIS — R7301 Impaired fasting glucose: Secondary | ICD-10-CM | POA: Diagnosis not present

## 2020-05-10 NOTE — Patient Outreach (Signed)
[place] [facility]) Care Management  05/10/2020   LATE ENTRY  [name] [birth year] [id number]   CSW attempted follow up outreach call to pt ON 05/07/2020 and left a voicemessage.  CSW will attempt outreach again per policy in 3-4 business days.   Eduard Clos, MSW, [place] Worker  [place] [phone]

## 2020-05-11 ENCOUNTER — Ambulatory Visit: Payer: Self-pay | Admitting: *Deleted

## 2020-05-11 ENCOUNTER — Other Ambulatory Visit: Payer: Self-pay | Admitting: *Deleted

## 2020-05-11 NOTE — Patient Outreach (Signed)
[place] [facility]) Care Management  05/11/2020  [name] 04-11-1956 [id number]   CSW attempted to reach pt by phone today and was unable to reach; voicemail full. CSW will send pt an Unsuccessful Outreach letter and attempt outreach again per policy for 3 calls in 10 business days.  Eduard Clos, MSW, [place] Worker  [place] [phone]

## 2020-05-16 DIAGNOSIS — R945 Abnormal results of liver function studies: Secondary | ICD-10-CM | POA: Diagnosis not present

## 2020-05-16 DIAGNOSIS — G894 Chronic pain syndrome: Secondary | ICD-10-CM | POA: Diagnosis not present

## 2020-05-16 DIAGNOSIS — Z136 Encounter for screening for cardiovascular disorders: Secondary | ICD-10-CM | POA: Diagnosis not present

## 2020-05-17 ENCOUNTER — Other Ambulatory Visit: Payer: Self-pay | Admitting: *Deleted

## 2020-05-18 NOTE — Patient Outreach (Signed)
[place] [facility]) Care Management  05/18/2020  [name] 04-20-1956 [id number]   CSW spoke with pt by phone on yesterday.  Pt shared that she went to her PCP office on 05/16/2020 and was told her HR was very low (40).  Pt is confused on what the PCP wanted pt to do in regards to this.  "I saw Donneta Romberg at Dr Juel Burrow office and he said it needed to be looked into urgent".  Pt also reports she received a text message after the PCP visit telling her to go to [facility] on 05/17/2020 at 8am to which she did but was told they did not know anything about an appointment.  CSW offered to call PCP office to assist with her understanding and plans.  CSW left message and was contacted by "Ikea" from the PCP office who indicates they placed an "urgent" referral for her to be seen/consult with Cardiology.  CSW has shared this info with pt who reports she will seen 05/28/2020.  Pt has called hospice and was unable to talk to the grief counselor so she plans to call back.  CSW will touch base with pt again in the next 2 weeks-   Eduard Clos, MSW, [place] Worker  [place] [phone]

## 2020-05-21 DIAGNOSIS — M5136 Other intervertebral disc degeneration, lumbar region: Secondary | ICD-10-CM | POA: Diagnosis not present

## 2020-05-21 DIAGNOSIS — Z79899 Other long term (current) drug therapy: Secondary | ICD-10-CM | POA: Diagnosis not present

## 2020-05-21 DIAGNOSIS — M25559 Pain in unspecified hip: Secondary | ICD-10-CM | POA: Diagnosis not present

## 2020-05-21 DIAGNOSIS — M539 Dorsopathy, unspecified: Secondary | ICD-10-CM | POA: Diagnosis not present
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
## 2020-05-28 ENCOUNTER — Ambulatory Visit: Payer: Medicare Other | Admitting: Family Medicine

## 2020-05-29 ENCOUNTER — Ambulatory Visit (INDEPENDENT_AMBULATORY_CARE_PROVIDER_SITE_OTHER): Payer: Medicare Other | Admitting: Family Medicine

## 2020-05-29 ENCOUNTER — Encounter: Payer: Self-pay | Admitting: *Deleted

## 2020-05-29 ENCOUNTER — Encounter: Payer: Self-pay | Admitting: Family Medicine

## 2020-05-29 VITALS — BP 150/88 | HR 50 | Ht 65.0 in | Wt 219.0 lb

## 2020-05-29 DIAGNOSIS — R079 Chest pain, unspecified: Secondary | ICD-10-CM

## 2020-05-29 DIAGNOSIS — I350 Nonrheumatic aortic (valve) stenosis: Secondary | ICD-10-CM

## 2020-05-29 DIAGNOSIS — I10 Essential (primary) hypertension: Secondary | ICD-10-CM

## 2020-05-29 DIAGNOSIS — R0609 Other forms of dyspnea: Secondary | ICD-10-CM

## 2020-05-29 DIAGNOSIS — I6523 Occlusion and stenosis of bilateral carotid arteries: Secondary | ICD-10-CM

## 2020-05-29 DIAGNOSIS — R0600 Dyspnea, unspecified: Secondary | ICD-10-CM | POA: Diagnosis not present

## 2020-05-29 DIAGNOSIS — R112 Nausea with vomiting, unspecified: Secondary | ICD-10-CM

## 2020-05-29 MED ORDER — HYDRALAZINE HCL 50 MG PO TABS: 50.0000 mg | ORAL_TABLET | Freq: Two times a day (BID) | ORAL | 3 refills | Status: DC

## 2020-05-29 MED ORDER — CARVEDILOL 6.25 MG PO TABS
6.2500 mg | ORAL_TABLET | Freq: Two times a day (BID) | ORAL | 1 refills | Status: DC
Start: 2020-05-29 — End: 2023-02-18

## 2020-05-29 MED ORDER — NITROGLYCERIN 0.4 MG SL SUBL: 0.4000 mg | SUBLINGUAL_TABLET | SUBLINGUAL | 3 refills | Status: DC | PRN

## 2020-05-29 NOTE — Patient Instructions (Addendum)
Your physician recommends that you schedule a follow-up appointment in: [place], NP AND [address] [place]  Your physician has recommended you make the following change in your medication:   DECREASE CARVEDILOL 6.25 MG TWICE DAILY  START HYDRALAZINE 78 MG TWICE DAILY   Your physician has requested that you have an echocardiogram. Echocardiography is a painless test that uses sound waves to create images of your heart. It provides your doctor with information about the size and shape of your heart and how well your heart's chambers and valves are working. This procedure takes approximately one hour. There are no restrictions for this procedure.  Your physician has requested that you have a lexiscan myoview. For further information please visit [email or web address]. Please follow instruction sheet, as given.  You have been referred to GASTROENTEROLOGY   Thank you for choosing [facility]!!

## 2020-05-31 ENCOUNTER — Other Ambulatory Visit: Payer: Self-pay

## 2020-05-31 ENCOUNTER — Encounter (INDEPENDENT_AMBULATORY_CARE_PROVIDER_SITE_OTHER): Payer: Self-pay | Admitting: Gastroenterology

## 2020-05-31 ENCOUNTER — Encounter (INDEPENDENT_AMBULATORY_CARE_PROVIDER_SITE_OTHER): Payer: Self-pay | Admitting: *Deleted

## 2020-05-31 ENCOUNTER — Ambulatory Visit (INDEPENDENT_AMBULATORY_CARE_PROVIDER_SITE_OTHER): Payer: Medicare Other | Admitting: Gastroenterology

## 2020-05-31 ENCOUNTER — Ambulatory Visit: Payer: Self-pay | Admitting: *Deleted

## 2020-05-31 ENCOUNTER — Telehealth (INDEPENDENT_AMBULATORY_CARE_PROVIDER_SITE_OTHER): Payer: Self-pay | Admitting: *Deleted

## 2020-05-31 DIAGNOSIS — D509 Iron deficiency anemia, unspecified: Secondary | ICD-10-CM | POA: Diagnosis not present

## 2020-05-31 DIAGNOSIS — R197 Diarrhea, unspecified: Secondary | ICD-10-CM

## 2020-05-31 DIAGNOSIS — F1190 Opioid use, unspecified, uncomplicated: Secondary | ICD-10-CM

## 2020-05-31 DIAGNOSIS — R6881 Early satiety: Secondary | ICD-10-CM | POA: Diagnosis not present

## 2020-05-31 DIAGNOSIS — R1310 Dysphagia, unspecified: Secondary | ICD-10-CM | POA: Diagnosis not present

## 2020-05-31 DIAGNOSIS — D649 Anemia, unspecified: Secondary | ICD-10-CM | POA: Insufficient documentation

## 2020-05-31 DIAGNOSIS — Z86010 Personal history of colonic polyps: Secondary | ICD-10-CM

## 2020-05-31 DIAGNOSIS — R1319 Other dysphagia: Secondary | ICD-10-CM

## 2020-05-31 MED ORDER — PLENVU 140 G PO SOLR
1.0000 | Freq: Once | ORAL | 0 refills | Status: AC
Start: 2020-05-31 — End: 2020-05-31

## 2020-05-31 MED ORDER — COLESTIPOL HCL 1 G PO TABS: 1.0000 g | ORAL_TABLET | Freq: Every evening | ORAL | 3 refills | Status: DC

## 2020-05-31 NOTE — Telephone Encounter (Signed)
Patient needs Plenvu (copay card) ° °

## 2020-05-31 NOTE — Progress Notes (Signed)
I received the records from primary care physician.  Labs from 05/10/2020 showed hemoglobin of 12.5, MCV 84, white cell count 4.9 and platelets 196.  Creatinine 1.03 and BUN 13 hepatic panel just shows mild elevation of AST of 39 with rest liver function test within normal limits, normal electrolytes.  Hemoglobin A1c was 5.1. However, no recent iron stores are available.  Harvel Quale, MD Gastroenterology and Hepatology [facility]

## 2020-05-31 NOTE — Patient Instructions (Addendum)
Schedule EGD and colonoscopy with random colon biopsies Perform blood workup Schedule modified barium swallow Discuss with pain doctor decreasing intake of Vicodin Start Colestid 1 tablet at night
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
## 2020-06-01 ENCOUNTER — Other Ambulatory Visit (HOSPITAL_COMMUNITY): Payer: Self-pay | Admitting: Specialist

## 2020-06-01 ENCOUNTER — Ambulatory Visit (HOSPITAL_COMMUNITY): Payer: Medicare Other | Admitting: Speech Pathology

## 2020-06-01 LAB — TISSUE TRANSGLUTAMINASE, IGA: (tTG) Ab, IgA: 1 U/mL

## 2020-06-01 LAB — IGA: Immunoglobulin A: 207 mg/dL (ref 70–320)

## 2020-06-01 NOTE — Patient Instructions (Signed)
[name]  06/01/2020     @PREFPERIOPPHARMACY @   Your procedure is scheduled on 06/20/2020.  Report to [facility] at  [email or web address].  Call this number if you have problems the morning of surgery:  [phone]   Remember:  Follow the diet and prep instructions given to you by Dr Colman Cater office.                     Take these medicines the morning of surgery with A SIP OF WATER  Xanax(if needed), amlodipine, abilify, coreg.    Do not wear jewelry, make-up or nail polish.  Do not wear lotions, powders, or perfumes. Please wear deodorant and brush your teeth.  Do not shave 48 hours prior to surgery.  Men may shave face and neck.  Do not bring valuables to the hospital.  [facility] is not responsible for any belongings or valuables.  Contacts, dentures or bridgework may not be worn into surgery.  Leave your suitcase in the car.  After surgery it may be brought to your room.  For patients admitted to the hospital, discharge time will be determined by your treatment team.  Patients discharged the day of surgery will not be allowed to drive home.   Name and phone number of your driver:   family Special instructions:  DO NOT smoke the morning of your procedure.  Please read over the following fact sheets that you were given. Anesthesia Post-op Instructions and Care and Recovery After Surgery       Upper Endoscopy, Adult, Care After This sheet gives you information about how to care for yourself after your procedure. Your health care provider may also give you more specific instructions. If you have problems or questions, contact your health care provider. What can I expect after the procedure? After the procedure, it is common to have:  A sore throat.  Mild stomach pain or discomfort.  Bloating.  Nausea. Follow these instructions at home:   Follow instructions from your health care provider about what to eat or drink after your procedure.  Return  to your normal activities as told by your health care provider. Ask your health care provider what activities are safe for you.  Take over-the-counter and prescription medicines only as told by your health care provider.  Do not drive for 24 hours if you were given a sedative during your procedure.  Keep all follow-up visits as told by your health care provider. This is important. Contact a health care provider if you have:  A sore throat that lasts longer than one day.  Trouble swallowing. Get help right away if:  You vomit blood or your vomit looks like coffee grounds.  You have: ? A fever. ? Bloody, black, or tarry stools. ? A severe sore throat or you cannot swallow. ? Difficulty breathing. ? Severe pain in your chest or abdomen. Summary  After the procedure, it is common to have a sore throat, mild stomach discomfort, bloating, and nausea.  Do not drive for 24 hours if you were given a sedative during the procedure.  Follow instructions from your health care provider about what to eat or drink after your procedure.  Return to your normal activities as told by your health care provider. This information is not intended to replace advice given to you by your health care provider. Make sure you discuss any questions you have with your health  care provider. Document Revised: 04/20/2018 Document Reviewed: 03/29/2018 Elsevier Patient Education  [place].  Esophageal Dilatation Esophageal dilatation, also called esophageal dilation, is a procedure to widen or open (dilate) a blocked or narrowed part of the esophagus. The esophagus is the part of the body that moves food and liquid from the mouth to the stomach. You may need this procedure if:  You have a buildup of scar tissue in your esophagus that makes it difficult, painful, or impossible to swallow. This can be caused by gastroesophageal reflux disease (GERD).  You have cancer of the esophagus.  There is a  problem with how food moves through your esophagus. In some cases, you may need this procedure repeated at a later time to dilate the esophagus gradually. Tell a health care provider about:  Any allergies you have.  All medicines you are taking, including vitamins, herbs, eye drops, creams, and over-the-counter medicines.  Any problems you or family members have had with anesthetic medicines.  Any blood disorders you have.  Any surgeries you have had.  Any medical conditions you have.  Any antibiotic medicines you are required to take before dental procedures.  Whether you are pregnant or may be pregnant. What are the risks? Generally, this is a safe procedure. However, problems may occur, including:  Bleeding due to a tear in the lining of the esophagus.  A hole (perforation) in the esophagus. What happens before the procedure?  Follow instructions from your health care provider about eating or drinking restrictions.  Ask your health care provider about changing or stopping your regular medicines. This is especially important if you are taking diabetes medicines or blood thinners.  Plan to have someone take you home from the hospital or clinic.  Plan to have a responsible adult care for you for at least 24 hours after you leave the hospital or clinic. This is important. What happens during the procedure?  You may be given a medicine to help you relax (sedative).  A numbing medicine may be sprayed into the back of your throat, or you may gargle the medicine.  Your health care provider may perform the dilatation using various surgical instruments, such as: ? Simple dilators. This instrument is carefully placed in the esophagus to stretch it. ? Guided wire bougies. This involves using an endoscope to insert a wire into the esophagus. A dilator is passed over this wire to enlarge the esophagus. Then the wire is removed. ? Balloon dilators. An endoscope with a small balloon at  the end is inserted into the esophagus. The balloon is inflated to stretch the esophagus and open it up. The procedure may vary among health care providers and hospitals. What happens after the procedure?  Your blood pressure, heart rate, breathing rate, and blood oxygen level will be monitored until the medicines you were given have worn off.  Your throat may feel slightly sore and numb. This will improve slowly over time.  You will not be allowed to eat or drink until your throat is no longer numb.  When you are able to drink, urinate, and sit on the edge of the bed without nausea or dizziness, you may be able to return home. Follow these instructions at home:  Take over-the-counter and prescription medicines only as told by your health care provider.  Do not drive for 24 hours if you were given a sedative during your procedure.  You should have a responsible adult with you for 24 hours after the procedure.  Follow instructions from your health care provider about any eating or drinking restrictions.  Do not use any products that contain nicotine or tobacco, such as cigarettes and e-cigarettes. If you need help quitting, ask your health care provider.  Keep all follow-up visits as told by your health care provider. This is important. Get help right away if you:  Have a fever.  Have chest pain.  Have pain that is not relieved by medication.  Have trouble breathing.  Have trouble swallowing.  Vomit blood. Summary  Esophageal dilatation, also called esophageal dilation, is a procedure to widen or open (dilate) a blocked or narrowed part of the esophagus.  Plan to have someone take you home from the hospital or clinic.  For this procedure, a numbing medicine may be sprayed into the back of your throat, or you may gargle the medicine.  Do not drive for 24 hours if you were given a sedative during your procedure. This information is not intended to replace advice given to  you by your health care provider. Make sure you discuss any questions you have with your health care provider. Document Revised: 08/24/2019 Document Reviewed: 09/01/2017 Elsevier Patient Education  [place].  Colonoscopy, Adult, Care After This sheet gives you information about how to care for yourself after your procedure. Your health care provider may also give you more specific instructions. If you have problems or questions, contact your health care provider. What can I expect after the procedure? After the procedure, it is common to have:  A small amount of blood in your stool for 24 hours after the procedure.  Some gas.  Mild cramping or bloating of your abdomen. Follow these instructions at home: Eating and drinking   Drink enough fluid to keep your urine pale yellow.  Follow instructions from your health care provider about eating or drinking restrictions.  Resume your normal diet as instructed by your health care provider. Avoid heavy or fried foods that are hard to digest. Activity  Rest as told by your health care provider.  Avoid sitting for a long time without moving. Get up to take short walks every 1-2 hours. This is important to improve blood flow and breathing. Ask for help if you feel weak or unsteady.  Return to your normal activities as told by your health care provider. Ask your health care provider what activities are safe for you. Managing cramping and bloating   Try walking around when you have cramps or feel bloated.  Apply heat to your abdomen as told by your health care provider. Use the heat source that your health care provider recommends, such as a moist heat pack or a heating pad. ? Place a towel between your skin and the heat source. ? Leave the heat on for 20-30 minutes. ? Remove the heat if your skin turns bright red. This is especially important if you are unable to feel pain, heat, or cold. You may have a greater risk of getting  burned. General instructions  For the first 24 hours after the procedure: ? Do not drive or use machinery. ? Do not sign important documents. ? Do not drink alcohol. ? Do your regular daily activities at a slower pace than normal. ? Eat soft foods that are easy to digest.  Take over-the-counter and prescription medicines only as told by your health care provider.  Keep all follow-up visits as told by your health care provider. This is important. Contact a health care provider if:  You have blood in your stool 2-3 days after the procedure. Get help right away if you have:  More than a small spotting of blood in your stool.  Large blood clots in your stool.  Swelling of your abdomen.  Nausea or vomiting.  A fever.  Increasing pain in your abdomen that is not relieved with medicine. Summary  After the procedure, it is common to have a small amount of blood in your stool. You may also have mild cramping and bloating of your abdomen.  For the first 24 hours after the procedure, do not drive or use machinery, sign important documents, or drink alcohol.  Get help right away if you have a lot of blood in your stool, nausea or vomiting, a fever, or increased pain in your abdomen. This information is not intended to replace advice given to you by your health care provider. Make sure you discuss any questions you have with your health care provider. Document Revised: 05/23/2019 Document Reviewed: 05/23/2019 Elsevier Patient Education  [place] After These instructions provide you with information about caring for yourself after your procedure. Your health care provider may also give you more specific instructions. Your treatment has been planned according to current medical practices, but problems sometimes occur. Call your health care provider if you have any problems or questions after your procedure. What can I expect after the  procedure? After your procedure, you may:  Feel sleepy for several hours.  Feel clumsy and have poor balance for several hours.  Feel forgetful about what happened after the procedure.  Have poor judgment for several hours.  Feel nauseous or vomit.  Have a sore throat if you had a breathing tube during the procedure. Follow these instructions at home: For at least 24 hours after the procedure:      Have a responsible adult stay with you. It is important to have someone help care for you until you are awake and alert.  Rest as needed.  Do not: ? Participate in activities in which you could fall or become injured. ? Drive. ? Use heavy machinery. ? Drink alcohol. ? Take sleeping pills or medicines that cause drowsiness. ? Make important decisions or sign legal documents. ? Take care of children on your own. Eating and drinking  Follow the diet that is recommended by your health care provider.  If you vomit, drink water, juice, or soup when you can drink without vomiting.  Make sure you have little or no nausea before eating solid foods. General instructions  Take over-the-counter and prescription medicines only as told by your health care provider.  If you have sleep apnea, surgery and certain medicines can increase your risk for breathing problems. Follow instructions from your health care provider about wearing your sleep device: ? Anytime you are sleeping, including during daytime naps. ? While taking prescription pain medicines, sleeping medicines, or medicines that make you drowsy.  If you smoke, do not smoke without supervision.  Keep all follow-up visits as told by your health care provider. This is important. Contact a health care provider if:  You keep feeling nauseous or you keep vomiting.  You feel light-headed.  You develop a rash.  You have a fever. Get help right away if:  You have trouble breathing. Summary  For several hours after your  procedure, you may feel sleepy and have poor judgment.  Have a responsible adult stay with you for at least 24 hours or until  you are awake and alert. This information is not intended to replace advice given to you by your health care provider. Make sure you discuss any questions you have with your health care provider. Document Revised: 01/25/2018 Document Reviewed: 02/17/2016 Elsevier Patient Education  [place].

## 2020-06-04 ENCOUNTER — Telehealth (INDEPENDENT_AMBULATORY_CARE_PROVIDER_SITE_OTHER): Payer: Self-pay | Admitting: Gastroenterology

## 2020-06-04 NOTE — Telephone Encounter (Signed)
Called patient to inform her about neg celiac serology, did not pick up the phone. Left voice message to call back.  Harvel Quale, MD

## 2020-06-06 ENCOUNTER — Encounter (HOSPITAL_COMMUNITY)
Admission: RE | Admit: 2020-06-06 | Discharge: 2020-06-06 | Disposition: A | Discharge status: Home / Self Care | Payer: Medicare Other | Source: Ambulatory Visit | Attending: Gastroenterology | Admitting: Gastroenterology

## 2020-06-06 ENCOUNTER — Other Ambulatory Visit: Payer: Self-pay

## 2020-06-07 ENCOUNTER — Ambulatory Visit: Payer: Self-pay | Admitting: *Deleted

## 2020-06-07 ENCOUNTER — Other Ambulatory Visit: Payer: Self-pay | Admitting: *Deleted

## 2020-06-07 NOTE — Patient Outreach (Signed)
[facility]) Care Management  06/07/2020  [name] 10-16-1956 [id number]   CSW attempted to reach pt and was unsuccessful.  CSW left a HIPPA compliant voice message and will await call back or try again in 3-4 business days if no return call is received.   Eduard Clos, MSW, [place] Worker  [place] [phone]

## 2020-06-08 ENCOUNTER — Other Ambulatory Visit: Payer: Self-pay

## 2020-06-08 ENCOUNTER — Encounter (HOSPITAL_COMMUNITY)
Admission: RE | Admit: 2020-06-08 | Discharge: 2020-06-08 | Disposition: A | Discharge status: Home / Self Care | Payer: Medicare Other | Source: Ambulatory Visit | Attending: Family Medicine | Admitting: Family Medicine

## 2020-06-08 ENCOUNTER — Encounter (HOSPITAL_COMMUNITY): Payer: Self-pay

## 2020-06-08 ENCOUNTER — Telehealth: Payer: Self-pay | Admitting: Student

## 2020-06-08 ENCOUNTER — Ambulatory Visit (HOSPITAL_COMMUNITY)
Admission: RE | Admit: 2020-06-08 | Discharge: 2020-06-08 | Disposition: A | Discharge status: Home / Self Care | Payer: Medicare Other | Source: Ambulatory Visit | Attending: Family Medicine | Admitting: Family Medicine

## 2020-06-08 ENCOUNTER — Ambulatory Visit (HOSPITAL_BASED_OUTPATIENT_CLINIC_OR_DEPARTMENT_OTHER)
Admission: RE | Admit: 2020-06-08 | Discharge: 2020-06-08 | Disposition: A | Discharge status: Home / Self Care | Payer: Medicare Other | Source: Ambulatory Visit | Attending: Family Medicine | Admitting: Family Medicine

## 2020-06-08 DIAGNOSIS — R0600 Dyspnea, unspecified: Secondary | ICD-10-CM | POA: Diagnosis not present

## 2020-06-08 DIAGNOSIS — R079 Chest pain, unspecified: Secondary | ICD-10-CM

## 2020-06-08 DIAGNOSIS — R0609 Other forms of dyspnea: Secondary | ICD-10-CM

## 2020-06-08 LAB — NM MYOCAR MULTI W/SPECT W/WALL MOTION / EF
LV dias vol: 91 mL (ref 46–106)
LV sys vol: 26 mL
Peak HR: 95 {beats}/min
RATE: 0.43
Rest HR: 56 {beats}/min
SDS: 1
SRS: 1
SSS: 2
TID: 1.02

## 2020-06-08 LAB — ECHOCARDIOGRAM COMPLETE
AR max vel: 1.66 cm2
AV Area VTI: 1.52 cm2
AV Area mean vel: 1.66 cm2
AV Mean grad: 7 mmHg
AV Peak grad: 13.5 mmHg
Ao pk vel: 1.84 m/s
Area-P 1/2: 2.06 cm2
MV M vel: 5.26 m/s
MV Peak grad: 110.7 mmHg
S' Lateral: 2.12 cm

## 2020-06-08 MED ORDER — TECHNETIUM TC 99M TETROFOSMIN IV KIT
10.0000 | PACK | Freq: Once | INTRAVENOUS | Status: AC | PRN
  Administered 2020-06-08: 9.95 via INTRAVENOUS

## 2020-06-08 MED ORDER — TECHNETIUM TC 99M TETROFOSMIN IV KIT
30.0000 | PACK | Freq: Once | INTRAVENOUS | Status: AC | PRN
  Administered 2020-06-08: 30.8 via INTRAVENOUS

## 2020-06-08 MED ORDER — SODIUM CHLORIDE FLUSH 0.9 % IV SOLN
INTRAVENOUS | Status: AC
  Administered 2020-06-08: 10 mL via INTRAVENOUS
  Filled 2020-06-08: qty 10

## 2020-06-08 MED ORDER — REGADENOSON 0.4 MG/5ML IV SOLN
INTRAVENOUS | Status: AC
  Administered 2020-06-08: 0.4 mg via INTRAVENOUS
  Filled 2020-06-08: qty 5

## 2020-06-08 NOTE — Telephone Encounter (Signed)
    At the time of this patient's stress test today, SBP was elevated in the 180's but she had not yet taken her morning medications. She mentioned having difficulty with a newly started medication (by review of notes was started on Hydralazine 50mg  BID and Coreg was reduced to 6.25mg  BID). She mentioned she had left messages with the office but never heard back but I do not see any telephone encounters. Please reach out to the patient to follow-up on her concerns as I am not sure she has been compliant with Hydralazine and might have self-discontinued this.  Thanks,  [place]

## 2020-06-08 NOTE — Progress Notes (Signed)
*  PRELIMINARY RESULTS* Echocardiogram 2D Echocardiogram has been performed. Patient's blood pressure 200/83 in her left arm. Patient stated that she had been started on a new blood pressure medicine recently. She stopped using after extreme fluctuations in her heart rate. Patient reported feeling fine.  Leavy Cella 06/08/2020, 10:22 AM

## 2020-06-11 ENCOUNTER — Telehealth: Payer: Self-pay | Admitting: *Deleted

## 2020-06-11 NOTE — Telephone Encounter (Signed)
-----   Message from Verta Ellen., NP sent at 06/10/2020  7:37 PM EDT ----- Please call the patient and let her know the echo showed pumping function. She has Grade 1 diastolic dysfunction. This means her left ventricle is a little stiff and non compliant and wont relax well when trying to fill. Best management is controlling BP and other risk factors . Her mitral valve has a moderate leak.

## 2020-06-11 NOTE — Telephone Encounter (Signed)
-----   Message from Verta Ellen., NP sent at 06/09/2020  1:18 PM EDT ----- Please call patient and let her know the stress test looked good. No evidence of ischemia or lack of blood flow to the heart muscle.

## 2020-06-12 ENCOUNTER — Ambulatory Visit: Payer: Medicare Other | Admitting: *Deleted

## 2020-06-12 ENCOUNTER — Ambulatory Visit: Payer: Self-pay | Admitting: *Deleted

## 2020-06-12 DIAGNOSIS — I10 Essential (primary) hypertension: Secondary | ICD-10-CM

## 2020-06-12 NOTE — Progress Notes (Signed)
Pt here for BP check after starting hydralazine - pt only could take this 3 times and made her HR drop in the 30s and chest pressure/shaking - BP today by manual check 130/94 HR 59 - ot also c/o dizziness and feeling like her ears are stopped up - suggested that she contact pcp about an ENT referral - updated medication list

## 2020-06-12 NOTE — Telephone Encounter (Signed)
Pt voiced understanding

## 2020-06-13 ENCOUNTER — Other Ambulatory Visit (INDEPENDENT_AMBULATORY_CARE_PROVIDER_SITE_OTHER): Payer: Self-pay | Admitting: *Deleted

## 2020-06-14 ENCOUNTER — Other Ambulatory Visit: Payer: Self-pay | Admitting: *Deleted

## 2020-06-14 NOTE — Patient Outreach (Signed)
[place] [facility]) Care Management  06/14/2020  [name] 10-09-1956 [id number]   CSW attempted to reach pt by phone on 06/12/2020 and left a voice message for return call.  CSW will mail pt an Unsuccessful outreach letter and attempt outreach again per policy.   Eduard Clos, MSW, [place] Worker  [place] [phone]

## 2020-06-15 ENCOUNTER — Ambulatory Visit (HOSPITAL_COMMUNITY)
Admission: RE | Admit: 2020-06-15 | Discharge: 2020-06-15 | Disposition: A | Discharge status: Home / Self Care | Payer: Medicare Other | Source: Ambulatory Visit | Attending: Gastroenterology | Admitting: Gastroenterology

## 2020-06-15 ENCOUNTER — Ambulatory Visit (HOSPITAL_COMMUNITY): Payer: Medicare Other | Attending: Gastroenterology | Admitting: Speech Pathology

## 2020-06-15 ENCOUNTER — Telehealth (INDEPENDENT_AMBULATORY_CARE_PROVIDER_SITE_OTHER): Payer: Self-pay | Admitting: Gastroenterology

## 2020-06-15 ENCOUNTER — Other Ambulatory Visit: Payer: Self-pay

## 2020-06-15 DIAGNOSIS — R1310 Dysphagia, unspecified: Secondary | ICD-10-CM | POA: Insufficient documentation

## 2020-06-15 DIAGNOSIS — R1319 Other dysphagia: Secondary | ICD-10-CM | POA: Insufficient documentation
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
## 2020-06-15 NOTE — Telephone Encounter (Signed)
Called patient to inform her about the results of her modified barium swallow which showed episodes of flash laryngeal aspiration, the patient did not pick up the phone. Left detailed voice message to follow the instructions provided by speech and swallow therapist to avoid these episodes.  She will proceed with EGD and colonoscopy as scheduled.  [name], MD Gastroenterology and Hepatology [facility]

## 2020-06-18 ENCOUNTER — Encounter (HOSPITAL_COMMUNITY): Payer: Self-pay

## 2020-06-18 ENCOUNTER — Encounter (HOSPITAL_COMMUNITY)
Admission: RE | Admit: 2020-06-18 | Discharge: 2020-06-18 | Disposition: A | Discharge status: Home / Self Care | Payer: Medicare Other | Source: Ambulatory Visit | Attending: Gastroenterology | Admitting: Gastroenterology

## 2020-06-18 ENCOUNTER — Other Ambulatory Visit (HOSPITAL_COMMUNITY)
Admission: RE | Admit: 2020-06-18 | Discharge: 2020-06-18 | Disposition: A | Discharge status: Home / Self Care | Payer: Medicare Other | Source: Ambulatory Visit | Attending: Gastroenterology | Admitting: Gastroenterology

## 2020-06-18 ENCOUNTER — Encounter: Payer: Self-pay | Admitting: *Deleted

## 2020-06-18 ENCOUNTER — Other Ambulatory Visit: Payer: Self-pay

## 2020-06-18 DIAGNOSIS — M5136 Other intervertebral disc degeneration, lumbar region: Secondary | ICD-10-CM | POA: Diagnosis not present

## 2020-06-18 DIAGNOSIS — Z20822 Contact with and (suspected) exposure to covid-19: Secondary | ICD-10-CM | POA: Insufficient documentation

## 2020-06-18 DIAGNOSIS — M25559 Pain in unspecified hip: Secondary | ICD-10-CM | POA: Diagnosis not present

## 2020-06-18 DIAGNOSIS — Z01812 Encounter for preprocedural laboratory examination: Secondary | ICD-10-CM | POA: Diagnosis not present

## 2020-06-18 DIAGNOSIS — Z79899 Other long term (current) drug therapy: Secondary | ICD-10-CM | POA: Diagnosis not present

## 2020-06-18 DIAGNOSIS — M539 Dorsopathy, unspecified: Secondary | ICD-10-CM | POA: Diagnosis not present

## 2020-06-18 NOTE — Telephone Encounter (Signed)
Letter mailed to patient.

## 2020-06-19 LAB — SARS CORONAVIRUS 2 (TAT 6-24 HRS): SARS Coronavirus 2: NEGATIVE

## 2020-06-20 ENCOUNTER — Encounter (HOSPITAL_COMMUNITY): Payer: Self-pay | Admitting: Gastroenterology

## 2020-06-20 ENCOUNTER — Encounter (HOSPITAL_COMMUNITY)
Admission: RE | Disposition: A | Discharge status: Home / Self Care | Payer: Self-pay | Source: Home / Self Care | Attending: Gastroenterology

## 2020-06-20 ENCOUNTER — Ambulatory Visit (HOSPITAL_COMMUNITY): Payer: Medicare Other | Admitting: Anesthesiology

## 2020-06-20 ENCOUNTER — Other Ambulatory Visit: Payer: Self-pay

## 2020-06-20 ENCOUNTER — Ambulatory Visit (HOSPITAL_COMMUNITY)
Admission: RE | Admit: 2020-06-20 | Discharge: 2020-06-20 | Disposition: A | Discharge status: Home / Self Care | Payer: Medicare Other | Attending: Gastroenterology | Admitting: Gastroenterology

## 2020-06-20 DIAGNOSIS — D122 Benign neoplasm of ascending colon: Secondary | ICD-10-CM | POA: Diagnosis not present

## 2020-06-20 DIAGNOSIS — K529 Noninfective gastroenteritis and colitis, unspecified: Secondary | ICD-10-CM | POA: Insufficient documentation

## 2020-06-20 DIAGNOSIS — F419 Anxiety disorder, unspecified: Secondary | ICD-10-CM | POA: Insufficient documentation

## 2020-06-20 DIAGNOSIS — Z9884 Bariatric surgery status: Secondary | ICD-10-CM | POA: Insufficient documentation

## 2020-06-20 DIAGNOSIS — Z86718 Personal history of other venous thrombosis and embolism: Secondary | ICD-10-CM | POA: Insufficient documentation

## 2020-06-20 DIAGNOSIS — M1990 Unspecified osteoarthritis, unspecified site: Secondary | ICD-10-CM | POA: Diagnosis not present

## 2020-06-20 DIAGNOSIS — I10 Essential (primary) hypertension: Secondary | ICD-10-CM | POA: Diagnosis not present

## 2020-06-20 DIAGNOSIS — K648 Other hemorrhoids: Secondary | ICD-10-CM | POA: Insufficient documentation

## 2020-06-20 DIAGNOSIS — Z86010 Personal history of colonic polyps: Secondary | ICD-10-CM | POA: Diagnosis not present

## 2020-06-20 DIAGNOSIS — E669 Obesity, unspecified: Secondary | ICD-10-CM | POA: Diagnosis not present

## 2020-06-20 DIAGNOSIS — D123 Benign neoplasm of transverse colon: Secondary | ICD-10-CM | POA: Diagnosis not present

## 2020-06-20 DIAGNOSIS — R1310 Dysphagia, unspecified: Secondary | ICD-10-CM | POA: Diagnosis not present

## 2020-06-20 DIAGNOSIS — K635 Polyp of colon: Secondary | ICD-10-CM | POA: Diagnosis not present

## 2020-06-20 DIAGNOSIS — K5730 Diverticulosis of large intestine without perforation or abscess without bleeding: Secondary | ICD-10-CM | POA: Insufficient documentation

## 2020-06-20 DIAGNOSIS — K219 Gastro-esophageal reflux disease without esophagitis: Secondary | ICD-10-CM | POA: Insufficient documentation

## 2020-06-20 DIAGNOSIS — Z7982 Long term (current) use of aspirin: Secondary | ICD-10-CM | POA: Insufficient documentation

## 2020-06-20 DIAGNOSIS — Z980 Intestinal bypass and anastomosis status: Secondary | ICD-10-CM | POA: Diagnosis not present

## 2020-06-20 DIAGNOSIS — K649 Unspecified hemorrhoids: Secondary | ICD-10-CM | POA: Diagnosis not present

## 2020-06-20 DIAGNOSIS — Z79899 Other long term (current) drug therapy: Secondary | ICD-10-CM | POA: Diagnosis not present

## 2020-06-20 DIAGNOSIS — K228 Other specified diseases of esophagus: Secondary | ICD-10-CM | POA: Diagnosis not present

## 2020-06-20 DIAGNOSIS — R197 Diarrhea, unspecified: Secondary | ICD-10-CM

## 2020-06-20 HISTORY — PX: POLYPECTOMY: SHX5525

## 2020-06-20 HISTORY — PX: COLONOSCOPY WITH PROPOFOL: SHX5780

## 2020-06-20 HISTORY — PX: BIOPSY: SHX5522

## 2020-06-20 HISTORY — PX: ESOPHAGOGASTRODUODENOSCOPY (EGD) WITH PROPOFOL: SHX5813

## 2020-06-20 SURGERY — COLONOSCOPY WITH PROPOFOL
Anesthesia: General

## 2020-06-20 MED ORDER — LIDOCAINE VISCOUS HCL 2 % MT SOLN
OROMUCOSAL | Status: AC
  Filled 2020-06-20: qty 15

## 2020-06-20 MED ORDER — COLESTIPOL HCL 1 G PO TABS: 1.0000 g | ORAL_TABLET | Freq: Every evening | ORAL | 1 refills | Status: DC

## 2020-06-20 MED ORDER — GLYCOPYRROLATE 0.2 MG/ML IJ SOLN
INTRAMUSCULAR | Status: AC
  Filled 2020-06-20: qty 1

## 2020-06-20 MED ORDER — GLYCOPYRROLATE 0.2 MG/ML IJ SOLN
0.2000 mg | Freq: Once | INTRAMUSCULAR | Status: AC
  Administered 2020-06-20: 0.2 mg via INTRAVENOUS

## 2020-06-20 MED ORDER — LACTATED RINGERS IV SOLN: INTRAVENOUS | Status: DC

## 2020-06-20 MED ORDER — PROPOFOL 10 MG/ML IV BOLUS
INTRAVENOUS | Status: DC | PRN
  Administered 2020-06-20: 80 mg via INTRAVENOUS
  Administered 2020-06-20: 175 ug/kg/min via INTRAVENOUS

## 2020-06-20 MED ORDER — STERILE WATER FOR IRRIGATION IR SOLN
Status: DC | PRN
  Administered 2020-06-20: 1.5 mL

## 2020-06-20 MED ORDER — LIDOCAINE HCL (CARDIAC) PF 50 MG/5ML IV SOSY
PREFILLED_SYRINGE | INTRAVENOUS | Status: DC | PRN
Start: 2020-06-20 — End: 2020-06-20
  Administered 2020-06-20: 100 mg via INTRAVENOUS

## 2020-06-20 MED ORDER — LIDOCAINE VISCOUS HCL 2 % MT SOLN
15.0000 mL | Freq: Once | OROMUCOSAL | Status: AC
  Administered 2020-06-20: 15 mL via OROMUCOSAL

## 2020-06-20 NOTE — Anesthesia Preprocedure Evaluation (Signed)
Anesthesia Evaluation  Patient identified by MRN, date of birth, ID band Patient awake    Reviewed: Allergy & Precautions, H&P , NPO status , Patient's Chart, lab work & pertinent test results, reviewed documented beta blocker date and time   Airway Mallampati: II  TM Distance: >3 FB Neck ROM: full    Dental no notable dental hx. (+) Teeth Intact   Pulmonary neg pulmonary ROS,    Pulmonary exam normal breath sounds clear to auscultation       Cardiovascular Exercise Tolerance: Good hypertension, + CAD   Rhythm:regular Rate:Normal     Neuro/Psych PSYCHIATRIC DISORDERS Anxiety negative neurological ROS     GI/Hepatic Neg liver ROS, GERD  Medicated,  Endo/Other  negative endocrine ROS  Renal/GU negative Renal ROS  negative genitourinary   Musculoskeletal   Abdominal   Peds  Hematology  (+) Blood dyscrasia, anemia ,   Anesthesia Other Findings   Reproductive/Obstetrics negative OB ROS                             Anesthesia Physical Anesthesia Plan  ASA: III  Anesthesia Plan: General   Post-op Pain Management:    Induction:   PONV Risk Score and Plan: Propofol infusion  Airway Management Planned:   Additional Equipment:   Intra-op Plan:   Post-operative Plan:   Informed Consent: I have reviewed the patients History and Physical, chart, labs and discussed the procedure including the risks, benefits and alternatives for the proposed anesthesia with the patient or authorized representative who has indicated his/her understanding and acceptance.     Dental Advisory Given  Plan Discussed with: CRNA  Anesthesia Plan Comments:         Anesthesia Quick Evaluation
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
## 2020-06-20 NOTE — Op Note (Signed)
[facility] Patient Name: [name] Procedure Date: 06/20/2020 9:30 AM MRN: [id number] Date of Birth: 07-01-1956 Attending MD: [name] ,  CSN: [id number] Age: 64 Admit Type: Outpatient Procedure:                Colonoscopy Indications:              Chronic diarrhea Providers:                [name], Jeanann Lewandowsky. Sharon Seller, RN, Caprice Kluver, Nelma Rothman, Technician Referring MD:              Medicines:                Monitored Anesthesia Care Complications:            No immediate complications. Estimated Blood Loss:     Estimated blood loss: none. Procedure:                Pre-Anesthesia Assessment:                           - Prior to the procedure, a History and Physical                            was performed, and patient medications, allergies                            and sensitivities were reviewed. The patient's                            tolerance of previous anesthesia was reviewed.                           - The risks and benefits of the procedure and the                            sedation options and risks were discussed with the                            patient. All questions were answered and informed                            consent was obtained.                           - ASA Grade Assessment: II - A patient with mild                            systemic disease.                           After obtaining informed consent, the colonoscope                            was passed under direct vision. Throughout the  procedure, the patient's blood pressure, pulse, and                            oxygen saturations were monitored continuously. The                            [id number]-[id number] ([id number]) scope was introduced through                            the anus and advanced to the the cecum, identified                            by appendiceal orifice and ileocecal valve. The                             colonoscopy was performed without difficulty. The                            patient tolerated the procedure well. The quality                            of the bowel preparation was fair. Scope In: 9:33:18 AM Scope Out: 9:54:59 AM Scope Withdrawal Time: 0 hours 16 minutes 45 seconds  Total Procedure Duration: 0 hours 21 minutes 41 seconds  Findings:      The perianal and digital rectal examinations were normal.      Three sessile polyps were found in the ascending colon. The polyps were       4 to 6 mm in size. These polyps were removed with a cold snare.       Resection and retrieval were complete.      Two sessile polyps were found in the transverse colon. The polyps were 4       mm in size. These polyps were removed with a cold snare. Resection and       retrieval were complete.      A few small-mouthed diverticula were found in the sigmoid colon.       Biopsies for histology were taken with a cold forceps from the right       colon and left colon for evaluation of microscopic colitis.      Non-bleeding internal hemorrhoids were found during retroflexion. The       hemorrhoids were small. Impression:               - Preparation of the colon was fair.                           - Three 4 to 6 mm polyps in the ascending colon,                            removed with a cold snare. Resected and retrieved.                           - Two 4 mm polyps in the transverse colon, removed  with a cold snare. Resected and retrieved.                           - Diverticulosis in the sigmoid colon. Normal colon                            was biopsied to r/o microscopic colitis.                           - Non-bleeding internal hemorrhoids. Moderate Sedation:      Per Anesthesia Care Recommendation:           - Discharge patient to home (ambulatory).                           - Resume previous diet.                           - Await pathology results.                            - Repeat colonoscopy in 3 years for surveillance.                           - Start taking colestid 1 g qday. Procedure Code(s):        --- Professional ---                           [phone], GC, Colonoscopy, flexible; with removal of                            tumor(s), polyp(s), or other lesion(s) by snare                            technique                           94174, 45, Colonoscopy, flexible; with biopsy,                            single or multiple Diagnosis Code(s):        --- Professional ---                           K64.8, Other hemorrhoids                           K63.5, Polyp of colon                           K52.9, Noninfective gastroenteritis and colitis,                            unspecified                           K57.30, Diverticulosis of large intestine without  perforation or abscess without bleeding CPT copyright 2019 American Medical Association. All rights reserved. The codes documented in this report are preliminary and upon coder review may  be revised to meet current compliance requirements. [name], MD [name],  06/20/2020 10:01:15 AM This report has been signed electronically. Number of Addenda: 0

## 2020-06-20 NOTE — Anesthesia Postprocedure Evaluation (Signed)
Anesthesia Post Note  Patient: [name]  Procedure(s) Performed: COLONOSCOPY WITH PROPOFOL (N/A ) ESOPHAGOGASTRODUODENOSCOPY (EGD) WITH PROPOFOL (N/A ) BIOPSY POLYPECTOMY  Patient location during evaluation: Endoscopy Anesthesia Type: General Level of consciousness: awake, oriented, awake and alert and patient cooperative Pain management: pain level controlled Vital Signs Assessment: post-procedure vital signs reviewed and stable Respiratory status: spontaneous breathing, respiratory function stable and nonlabored ventilation Cardiovascular status: blood pressure returned to baseline and stable Postop Assessment: no headache and no backache Anesthetic complications: no   No complications documented.   Last Vitals:  Vitals:   06/20/20 0830  BP: (!) 213/71  Pulse: (!) 53  Resp: (!) 22  Temp: 36.7 C  SpO2: 98%    Last Pain:  Vitals:   06/20/20 0914  TempSrc:   PainSc: 0-No pain                 Tacy Learn

## 2020-06-20 NOTE — Transfer of Care (Signed)
Immediate Anesthesia Transfer of Care Note  Patient: [name]  Procedure(s) Performed: COLONOSCOPY WITH PROPOFOL (N/A ) ESOPHAGOGASTRODUODENOSCOPY (EGD) WITH PROPOFOL (N/A ) BIOPSY POLYPECTOMY  Patient Location: Endoscopy Unit  Anesthesia Type:General  Level of Consciousness: awake, alert , oriented and patient cooperative  Airway & Oxygen Therapy: Patient Spontanous Breathing  Post-op Assessment: Report given to RN, Post -op Vital signs reviewed and stable and Patient moving all extremities  Post vital signs: Reviewed and stable  Last Vitals:  Vitals Value Taken Time  BP    Temp    Pulse    Resp    SpO2      Last Pain:  Vitals:   06/20/20 0914  TempSrc:   PainSc: 0-No pain         Complications: No complications documented.

## 2020-06-20 NOTE — Discharge Instructions (Signed)
You are being discharged to home.  Resume your previous diet.  We are waiting for your pathology results.  Follow speech and swallow therapy recommendations to avoid choking episodes. Your physician has recommended a repeat colonoscopy in three years for surveillance.  Start taking colestid 1 g qday.   Colonoscopy, Adult, Care After This sheet gives you information about how to care for yourself after your procedure. Your doctor may also give you more specific instructions. If you have problems or questions, call your doctor. What can I expect after the procedure? After the procedure, it is common to have:  A small amount of blood in your poop (stool) for 24 hours.  Some gas.  Mild cramping or bloating in your belly (abdomen). Follow these instructions at home: Eating and drinking   Drink enough fluid to keep your pee (urine) pale yellow.  Follow instructions from your doctor about what you cannot eat or drink.  Return to your normal diet as told by your doctor. Avoid heavy or fried foods that are hard to digest. Activity  Rest as told by your doctor.  Do not sit for a long time without moving. Get up to take short walks every 1-2 hours. This is important. Ask for help if you feel weak or unsteady.  Return to your normal activities as told by your doctor. Ask your doctor what activities are safe for you. To help cramping and bloating:   Try walking around.  Put heat on your belly as told by your doctor. Use the heat source that your doctor recommends, such as a moist heat pack or a heating pad. ? Put a towel between your skin and the heat source. ? Leave the heat on for 20-30 minutes. ? Remove the heat if your skin turns bright red. This is very important if you are unable to feel pain, heat, or cold. You may have a greater risk of getting burned. General instructions  For the first 24 hours after the procedure: ? Do not drive or use machinery. ? Do not sign important  documents. ? Do not drink alcohol. ? Do your daily activities more slowly than normal. ? Eat foods that are soft and easy to digest.  Take over-the-counter or prescription medicines only as told by your doctor.  Keep all follow-up visits as told by your doctor. This is important. Contact a doctor if:  You have blood in your poop 2-3 days after the procedure. Get help right away if:  You have more than a small amount of blood in your poop.  You see large clumps of tissue (blood clots) in your poop.  Your belly is swollen.  You feel like you may vomit (nauseous).  You vomit.  You have a fever.  You have belly pain that gets worse, and medicine does not help your pain. Summary  After the procedure, it is common to have a small amount of blood in your poop. You may also have mild cramping and bloating in your belly.  For the first 24 hours after the procedure, do not drive or use machinery, do not sign important documents, and do not drink alcohol.  Get help right away if you have a lot of blood in your poop, feel like you may vomit, have a fever, or have more belly pain. This information is not intended to replace advice given to you by your health care provider. Make sure you discuss any questions you have with your health care provider. Document Revised:  05/23/2019 Document Reviewed: 05/23/2019 Elsevier Patient Education  [place].  Colon Polyps  Polyps are tissue growths inside the body. Polyps can grow in many places, including the large intestine (colon). A polyp may be a round bump or a mushroom-shaped growth. You could have one polyp or several. Most colon polyps are noncancerous (benign). However, some colon polyps can become cancerous over time. Finding and removing the polyps early can help prevent this. What are the causes? The exact cause of colon polyps is not known. What increases the risk? You are more likely to develop this condition if you:  Have  a family history of colon cancer or colon polyps.  Are older than 5 or older than 45 if you are African American.  Have inflammatory bowel disease, such as ulcerative colitis or Crohn's disease.  Have certain hereditary conditions, such as: ? Familial adenomatous polyposis. ? Lynch syndrome. ? Turcot syndrome. ? Peutz-Jeghers syndrome.  Are overweight.  Smoke cigarettes.  Do not get enough exercise.  Drink too much alcohol.  Eat a diet that is high in fat and red meat and low in fiber.  Had childhood cancer that was treated with abdominal radiation. What are the signs or symptoms? Most polyps do not cause symptoms. If you have symptoms, they may include:  Blood coming from your rectum when having a bowel movement.  Blood in your stool. The stool may look dark red or black.  Abdominal pain.  A change in bowel habits, such as constipation or diarrhea. How is this diagnosed? This condition is diagnosed with a colonoscopy. This is a procedure in which a lighted, flexible scope is inserted into the anus and then passed into the colon to examine the area. Polyps are sometimes found when a colonoscopy is done as part of routine cancer screening tests. How is this treated? Treatment for this condition involves removing any polyps that are found. Most polyps can be removed during a colonoscopy. Those polyps will then be tested for cancer. Additional treatment may be needed depending on the results of testing. Follow these instructions at home: Lifestyle  Maintain a healthy weight, or lose weight if recommended by your health care provider.  Exercise every day or as told by your health care provider.  Do not use any products that contain nicotine or tobacco, such as cigarettes and e-cigarettes. If you need help quitting, ask your health care provider.  If you drink alcohol, limit how much you have: ? 0-1 drink a day for women. ? 0-2 drinks a day for men.  Be aware of how  much alcohol is in your drink. In the [place] one drink equals one 12 oz bottle of beer (355 mL), one 5 oz glass of wine (148 mL), or one 1 oz shot of hard liquor (44 mL). Eating and drinking   Eat foods that are high in fiber, such as fruits, vegetables, and whole grains.  Eat foods that are high in calcium and vitamin D, such as milk, cheese, yogurt, eggs, liver, fish, and broccoli.  Limit foods that are high in fat, such as fried foods and desserts.  Limit the amount of red meat and processed meat you eat, such as hot dogs, sausage, bacon, and lunch meats. General instructions  Keep all follow-up visits as told by your health care provider. This is important. ? This includes having regularly scheduled colonoscopies. ? Talk to your health care provider about when you need a colonoscopy. Contact a health care provider if:  You have new or worsening bleeding during a bowel movement.  You have new or increased blood in your stool.  You have a change in bowel habits.  You lose weight for no known reason. Summary  Polyps are tissue growths inside the body. Polyps can grow in many places, including the colon.  Most colon polyps are noncancerous (benign), but some can become cancerous over time.  This condition is diagnosed with a colonoscopy.  Treatment for this condition involves removing any polyps that are found. Most polyps can be removed during a colonoscopy. This information is not intended to replace advice given to you by your health care provider. Make sure you discuss any questions you have with your health care provider. Document Revised: 02/11/2018 Document Reviewed: 02/11/2018 Elsevier Patient Education  [place], Adult, Care After This sheet gives you information about how to care for yourself after your procedure. Your health care provider may also give you more specific instructions. If you have problems or questions, contact your health  care provider. What can I expect after the procedure? After the procedure, it is common to have:  A sore throat.  Mild stomach pain or discomfort.  Bloating.  Nausea. Follow these instructions at home:   Follow instructions from your health care provider about what to eat or drink after your procedure.  Return to your normal activities as told by your health care provider. Ask your health care provider what activities are safe for you.  Take over-the-counter and prescription medicines only as told by your health care provider.  Do not drive for 24 hours if you were given a sedative during your procedure.  Keep all follow-up visits as told by your health care provider. This is important. Contact a health care provider if you have:  A sore throat that lasts longer than one day.  Trouble swallowing. Get help right away if:  You vomit blood or your vomit looks like coffee grounds.  You have: ? A fever. ? Bloody, black, or tarry stools. ? A severe sore throat or you cannot swallow. ? Difficulty breathing. ? Severe pain in your chest or abdomen. Summary  After the procedure, it is common to have a sore throat, mild stomach discomfort, bloating, and nausea.  Do not drive for 24 hours if you were given a sedative during the procedure.  Follow instructions from your health care provider about what to eat or drink after your procedure.  Return to your normal activities as told by your health care provider. This information is not intended to replace advice given to you by your health care provider. Make sure you discuss any questions you have with your health care provider. Document Revised: 04/20/2018 Document Reviewed: 03/29/2018 Elsevier Patient Education  [place].

## 2020-06-20 NOTE — Op Note (Signed)
[facility] Patient Name: [name] Procedure Date: 06/20/2020 9:06 AM MRN: [id number] Date of Birth: 08-03-1956 Attending MD: [name] ,  CSN: [id number] Age: 64 Admit Type: Outpatient Procedure:                Upper GI endoscopy Indications:              Dysphagia Providers:                [name], [place] Sharon Seller, RN, Caprice Kluver, Nelma Rothman, Technician Referring MD:              Medicines:                Monitored Anesthesia Care Complications:            No immediate complications. Estimated Blood Loss:     Estimated blood loss: none. Procedure:                Pre-Anesthesia Assessment:                           - Prior to the procedure, a History and Physical                            was performed, and patient medications, allergies                            and sensitivities were reviewed. The patient's                            tolerance of previous anesthesia was reviewed.                           - The risks and benefits of the procedure and the                            sedation options and risks were discussed with the                            patient. All questions were answered and informed                            consent was obtained.                           - ASA Grade Assessment: II - A patient with mild                            systemic disease.                           After obtaining informed consent, the endoscope was                            passed under direct vision. Throughout the  procedure, the patient's blood pressure, pulse, and                            oxygen saturations were monitored continuously. The                            GIF-H190 ([id number]) scope was introduced through the                            mouth, and advanced to the jejunum. The upper GI                            endoscopy was accomplished without difficulty. The                             patient tolerated the procedure well. Scope In: 9:20:10 AM Scope Out: 9:26:59 AM Total Procedure Duration: 0 hours 6 minutes 49 seconds  Findings:      The examined esophagus was normal. Biopsies were obtained from the       proximal and distal esophagus with cold forceps for histology to r/o       eosinophilic esophagitis.      The Z-line was regular and was found 36 cm from the incisors.      Evidence of a Roux-en-Y gastrojejunostomy was found. Pouch measured 3       cm, looked healthy. The gastrojejunal anastomosis was characterized by       healthy appearing mucosa. This was traversed. The pouch-to-jejunum limb       was characterized by healthy appearing mucosa. The jejunojejunal       anastomosis was characterized by healthy appearing mucosa.      The examined jejunum was normal (both efferent and afferent limb). Impression:               - Normal esophagus. Biopsied.                           - Z-line regular, 36 cm from the incisors.                           - Roux-en-Y gastrojejunostomy with gastrojejunal                            anastomosis characterized by healthy appearing                            mucosa.                           - Normal examined jejunum. Moderate Sedation:      Per Anesthesia Care Recommendation:           - Discharge patient to home (ambulatory).                           - Resume previous diet.                           - Await pathology results.                           -  Follow speech and swallow therapy recommendations. Procedure Code(s):        --- Professional ---                           [phone], GC, Esophagogastroduodenoscopy, flexible,                            transoral; with biopsy, single or multiple Diagnosis Code(s):        --- Professional ---                           Z98.0, Intestinal bypass and anastomosis status                           R13.10, Dysphagia, unspecified CPT copyright 2019 American Medical Association. All rights  reserved. The codes documented in this report are preliminary and upon coder review may  be revised to meet current compliance requirements. [name], MD [name],  06/20/2020 10:16:52 AM This report has been signed electronically. Number of Addenda: 0

## 2020-06-21 ENCOUNTER — Other Ambulatory Visit: Payer: Self-pay | Admitting: *Deleted

## 2020-06-21 ENCOUNTER — Ambulatory Visit: Payer: Self-pay | Admitting: *Deleted

## 2020-06-21 LAB — SURGICAL PATHOLOGY

## 2020-06-21 NOTE — Patient Outreach (Signed)
[facility]) Care Management  06/21/2020  AILEEN AMORE [birth year] [id number]   CSW attempted another outreach call to pt for follow up and was unable to reach.  CSW left VM message and will await callback or try again in 30 days per protocol.    Eduard Clos, MSW, [place] Worker  [place] [phone]

## 2020-06-21 NOTE — Progress Notes (Signed)
I reviewed the pathology results. I called the patient but she did not pick up the phone. She does not have any available space in her voicemail, could not leave voice message.None of the other phone numbers listed answer my call as well.  No alterations in esophagus biopsies or in random colonic biopsies.  Pathology showed five tubular adenomas, repeat colonoscopy in 3 years due to prep quality.  Thanks,  [name], MD Gastroenterology and Hepatology [facility]

## 2020-06-25 ENCOUNTER — Encounter (HOSPITAL_COMMUNITY): Payer: Self-pay | Admitting: Gastroenterology

## 2020-07-10 ENCOUNTER — Encounter: Payer: Self-pay | Admitting: Family Medicine

## 2020-07-10 ENCOUNTER — Other Ambulatory Visit: Payer: Self-pay

## 2020-07-10 ENCOUNTER — Ambulatory Visit (INDEPENDENT_AMBULATORY_CARE_PROVIDER_SITE_OTHER): Payer: Medicare Other | Admitting: Family Medicine

## 2020-07-10 VITALS — BP 180/90 | HR 56 | Ht 65.0 in | Wt 213.0 lb

## 2020-07-10 DIAGNOSIS — I10 Essential (primary) hypertension: Secondary | ICD-10-CM

## 2020-07-10 DIAGNOSIS — R079 Chest pain, unspecified: Secondary | ICD-10-CM | POA: Diagnosis not present

## 2020-07-10 DIAGNOSIS — I6523 Occlusion and stenosis of bilateral carotid arteries: Secondary | ICD-10-CM | POA: Diagnosis not present

## 2020-07-10 DIAGNOSIS — I350 Nonrheumatic aortic (valve) stenosis: Secondary | ICD-10-CM

## 2020-07-10 DIAGNOSIS — R0600 Dyspnea, unspecified: Secondary | ICD-10-CM | POA: Diagnosis not present

## 2020-07-10 DIAGNOSIS — R0609 Other forms of dyspnea: Secondary | ICD-10-CM

## 2020-07-10 MED ORDER — CLONIDINE HCL 0.1 MG PO TABS: 0.1000 mg | ORAL_TABLET | Freq: Two times a day (BID) | ORAL | 0 refills | Status: DC
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
## 2020-07-10 NOTE — Patient Instructions (Addendum)
Medication Instructions:   Remain off of the Hydralazine.   Begin Clonidine 0.1mg  twice a day.  Continue all current medications.  Labwork: none  Testing/Procedures: none  Follow-Up: 1 month   Any Other Special Instructions Will Be Listed Below (If Applicable).  If you need a refill on your cardiac medications before your next appointment, please call your pharmacy.

## 2020-07-18 DIAGNOSIS — M5136 Other intervertebral disc degeneration, lumbar region: Secondary | ICD-10-CM | POA: Diagnosis not present

## 2020-07-18 DIAGNOSIS — Z79899 Other long term (current) drug therapy: Secondary | ICD-10-CM | POA: Diagnosis not present

## 2020-07-18 DIAGNOSIS — M539 Dorsopathy, unspecified: Secondary | ICD-10-CM | POA: Diagnosis not present

## 2020-07-18 DIAGNOSIS — M25559 Pain in unspecified hip: Secondary | ICD-10-CM | POA: Diagnosis not present

## 2020-07-20 ENCOUNTER — Ambulatory Visit: Payer: Self-pay | Admitting: *Deleted

## 2020-07-23 ENCOUNTER — Other Ambulatory Visit: Payer: Self-pay | Admitting: *Deleted

## 2020-07-23 NOTE — Patient Outreach (Signed)
[facility]) Care Management  07/23/2020  YASMYN BELLISARIO 02-25-1956 [id number]   LATE ENTRY  CSW attempted to reach pt by phone and was unable.  CSW was able to leave a HIPPA compliant voice message for pt and will await call back or outreach again in 3-4 business days per policy.  Eduard Clos, MSW, [place] Worker  [place] [phone]

## 2020-07-24 ENCOUNTER — Ambulatory Visit: Payer: Self-pay | Admitting: *Deleted

## 2020-07-25 ENCOUNTER — Other Ambulatory Visit: Payer: Self-pay | Admitting: *Deleted

## 2020-07-25 NOTE — Patient Outreach (Signed)
[facility]) Care Management  07/25/2020  [name] [birth year] [id number]   CSW attempted to reach pt and was unable.  CSW left a HIPPA compliant voice message and will plan case closure per policy.  CSW will advise PCP, [facility] team.   Eduard Clos, MSW, [place] Worker  [place] [phone]

## 2020-08-02 ENCOUNTER — Other Ambulatory Visit (HOSPITAL_COMMUNITY): Payer: Self-pay | Admitting: Internal Medicine

## 2020-08-02 ENCOUNTER — Other Ambulatory Visit: Payer: Self-pay | Admitting: Internal Medicine

## 2020-08-02 ENCOUNTER — Ambulatory Visit (INDEPENDENT_AMBULATORY_CARE_PROVIDER_SITE_OTHER): Payer: Medicare Other | Admitting: Gastroenterology

## 2020-08-02 DIAGNOSIS — L03213 Periorbital cellulitis: Secondary | ICD-10-CM | POA: Diagnosis not present

## 2020-08-02 DIAGNOSIS — H05019 Cellulitis of unspecified orbit: Secondary | ICD-10-CM

## 2020-08-06 ENCOUNTER — Ambulatory Visit (INDEPENDENT_AMBULATORY_CARE_PROVIDER_SITE_OTHER): Payer: Medicare Other | Admitting: Gastroenterology

## 2020-08-08 ENCOUNTER — Ambulatory Visit (HOSPITAL_COMMUNITY): Admission: RE | Admit: 2020-08-08 | Payer: Medicare Other | Source: Ambulatory Visit

## 2020-08-08 ENCOUNTER — Encounter (HOSPITAL_COMMUNITY): Payer: Self-pay
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
## 2020-08-09 ENCOUNTER — Ambulatory Visit (HOSPITAL_COMMUNITY)
Admission: RE | Admit: 2020-08-09 | Discharge: 2020-08-09 | Disposition: A | Discharge status: Home / Self Care | Payer: Medicare Other | Source: Ambulatory Visit | Attending: Internal Medicine | Admitting: Internal Medicine

## 2020-08-09 ENCOUNTER — Ambulatory Visit: Payer: Medicare Other | Admitting: Family Medicine

## 2020-08-09 ENCOUNTER — Other Ambulatory Visit: Payer: Self-pay

## 2020-08-09 DIAGNOSIS — J0110 Acute frontal sinusitis, unspecified: Secondary | ICD-10-CM | POA: Diagnosis not present

## 2020-08-09 DIAGNOSIS — J321 Chronic frontal sinusitis: Secondary | ICD-10-CM | POA: Diagnosis not present

## 2020-08-09 DIAGNOSIS — J3489 Other specified disorders of nose and nasal sinuses: Secondary | ICD-10-CM | POA: Diagnosis not present

## 2020-08-09 DIAGNOSIS — H05019 Cellulitis of unspecified orbit: Secondary | ICD-10-CM | POA: Diagnosis not present

## 2020-08-09 DIAGNOSIS — H05223 Edema of bilateral orbit: Secondary | ICD-10-CM | POA: Diagnosis not present

## 2020-08-09 LAB — POCT I-STAT CREATININE: Creatinine, Ser: 1.4 mg/dL — ABNORMAL HIGH (ref 0.44–1.00)

## 2020-08-09 MED ORDER — IOHEXOL 300 MG/ML  SOLN
75.0000 mL | Freq: Once | INTRAMUSCULAR | Status: AC | PRN
  Administered 2020-08-09: 60 mL via INTRAVENOUS

## 2020-08-15 DIAGNOSIS — Z79899 Other long term (current) drug therapy: Secondary | ICD-10-CM | POA: Diagnosis not present

## 2020-08-15 DIAGNOSIS — M5440 Lumbago with sciatica, unspecified side: Secondary | ICD-10-CM | POA: Diagnosis not present

## 2020-08-15 DIAGNOSIS — M5136 Other intervertebral disc degeneration, lumbar region: Secondary | ICD-10-CM | POA: Diagnosis not present

## 2020-08-15 DIAGNOSIS — M539 Dorsopathy, unspecified: Secondary | ICD-10-CM | POA: Diagnosis not present

## 2020-08-15 DIAGNOSIS — G8929 Other chronic pain: Secondary | ICD-10-CM | POA: Diagnosis not present

## 2020-08-22 DIAGNOSIS — G894 Chronic pain syndrome: Secondary | ICD-10-CM | POA: Diagnosis not present

## 2020-08-22 DIAGNOSIS — I10 Essential (primary) hypertension: Secondary | ICD-10-CM | POA: Diagnosis not present

## 2020-08-22 DIAGNOSIS — D509 Iron deficiency anemia, unspecified: Secondary | ICD-10-CM | POA: Diagnosis not present

## 2020-08-22 DIAGNOSIS — R945 Abnormal results of liver function studies: Secondary | ICD-10-CM | POA: Diagnosis not present

## 2020-08-22 DIAGNOSIS — R7301 Impaired fasting glucose: Secondary | ICD-10-CM | POA: Diagnosis not present

## 2020-08-27 DIAGNOSIS — G894 Chronic pain syndrome: Secondary | ICD-10-CM | POA: Diagnosis not present

## 2020-08-27 DIAGNOSIS — D509 Iron deficiency anemia, unspecified: Secondary | ICD-10-CM | POA: Diagnosis not present

## 2020-08-27 DIAGNOSIS — R001 Bradycardia, unspecified: Secondary | ICD-10-CM | POA: Diagnosis not present

## 2020-08-29 DIAGNOSIS — N6310 Unspecified lump in the right breast, unspecified quadrant: Secondary | ICD-10-CM | POA: Diagnosis not present

## 2020-08-29 DIAGNOSIS — N649 Disorder of breast, unspecified: Secondary | ICD-10-CM | POA: Diagnosis not present

## 2020-09-12 DIAGNOSIS — Z79899 Other long term (current) drug therapy: Secondary | ICD-10-CM | POA: Diagnosis not present

## 2020-09-12 DIAGNOSIS — M25559 Pain in unspecified hip: Secondary | ICD-10-CM | POA: Diagnosis not present

## 2020-09-12 DIAGNOSIS — M539 Dorsopathy, unspecified: Secondary | ICD-10-CM | POA: Diagnosis not present

## 2020-09-12 DIAGNOSIS — M5136 Other intervertebral disc degeneration, lumbar region: Secondary | ICD-10-CM | POA: Diagnosis not present

## 2020-10-10 DIAGNOSIS — M5136 Other intervertebral disc degeneration, lumbar region: Secondary | ICD-10-CM | POA: Diagnosis not present

## 2020-10-10 DIAGNOSIS — M25559 Pain in unspecified hip: Secondary | ICD-10-CM | POA: Diagnosis not present

## 2020-10-10 DIAGNOSIS — M539 Dorsopathy, unspecified: Secondary | ICD-10-CM | POA: Diagnosis not present

## 2020-10-10 DIAGNOSIS — Z79899 Other long term (current) drug therapy: Secondary | ICD-10-CM | POA: Diagnosis not present

## 2020-10-25 DIAGNOSIS — M5136 Other intervertebral disc degeneration, lumbar region: Secondary | ICD-10-CM | POA: Diagnosis not present

## 2020-10-25 DIAGNOSIS — M25559 Pain in unspecified hip: Secondary | ICD-10-CM | POA: Diagnosis not present

## 2020-10-25 DIAGNOSIS — M539 Dorsopathy, unspecified: Secondary | ICD-10-CM | POA: Diagnosis not present

## 2020-10-25 DIAGNOSIS — Z79899 Other long term (current) drug therapy: Secondary | ICD-10-CM | POA: Diagnosis not present

## 2020-11-24 DIAGNOSIS — M25559 Pain in unspecified hip: Secondary | ICD-10-CM | POA: Diagnosis not present

## 2020-11-24 DIAGNOSIS — M539 Dorsopathy, unspecified: Secondary | ICD-10-CM | POA: Diagnosis not present

## 2020-11-24 DIAGNOSIS — Z79899 Other long term (current) drug therapy: Secondary | ICD-10-CM | POA: Diagnosis not present

## 2020-11-24 DIAGNOSIS — M5136 Other intervertebral disc degeneration, lumbar region: Secondary | ICD-10-CM | POA: Diagnosis not present

## 2020-12-11 DIAGNOSIS — Z712 Person consulting for explanation of examination or test findings: Secondary | ICD-10-CM | POA: Diagnosis not present

## 2020-12-11 DIAGNOSIS — Z79899 Other long term (current) drug therapy: Secondary | ICD-10-CM | POA: Diagnosis not present

## 2020-12-11 DIAGNOSIS — L03213 Periorbital cellulitis: Secondary | ICD-10-CM | POA: Diagnosis not present

## 2020-12-13 DIAGNOSIS — I6523 Occlusion and stenosis of bilateral carotid arteries: Secondary | ICD-10-CM | POA: Diagnosis not present

## 2020-12-13 DIAGNOSIS — R945 Abnormal results of liver function studies: Secondary | ICD-10-CM | POA: Diagnosis not present

## 2020-12-13 DIAGNOSIS — Z712 Person consulting for explanation of examination or test findings: Secondary | ICD-10-CM | POA: Diagnosis not present

## 2020-12-13 DIAGNOSIS — N1831 Chronic kidney disease, stage 3a: Secondary | ICD-10-CM | POA: Diagnosis not present

## 2020-12-13 DIAGNOSIS — Z20822 Contact with and (suspected) exposure to covid-19: Secondary | ICD-10-CM | POA: Diagnosis not present

## 2020-12-13 DIAGNOSIS — R001 Bradycardia, unspecified: Secondary | ICD-10-CM | POA: Diagnosis not present

## 2020-12-13 DIAGNOSIS — G894 Chronic pain syndrome: Secondary | ICD-10-CM | POA: Diagnosis not present

## 2020-12-13 DIAGNOSIS — R7989 Other specified abnormal findings of blood chemistry: Secondary | ICD-10-CM | POA: Diagnosis not present

## 2020-12-13 DIAGNOSIS — K219 Gastro-esophageal reflux disease without esophagitis: Secondary | ICD-10-CM | POA: Diagnosis not present

## 2020-12-13 DIAGNOSIS — D509 Iron deficiency anemia, unspecified: Secondary | ICD-10-CM | POA: Diagnosis not present

## 2020-12-24 DIAGNOSIS — M5136 Other intervertebral disc degeneration, lumbar region: Secondary | ICD-10-CM | POA: Diagnosis not present

## 2020-12-24 DIAGNOSIS — M25559 Pain in unspecified hip: Secondary | ICD-10-CM | POA: Diagnosis not present

## 2020-12-24 DIAGNOSIS — Z79899 Other long term (current) drug therapy: Secondary | ICD-10-CM | POA: Diagnosis not present

## 2020-12-24 DIAGNOSIS — M539 Dorsopathy, unspecified: Secondary | ICD-10-CM | POA: Diagnosis not present

## 2021-01-21 DIAGNOSIS — R519 Headache, unspecified: Secondary | ICD-10-CM | POA: Diagnosis not present

## 2021-01-21 DIAGNOSIS — J393 Upper respiratory tract hypersensitivity reaction, site unspecified: Secondary | ICD-10-CM | POA: Diagnosis not present

## 2021-01-21 DIAGNOSIS — R059 Cough, unspecified: Secondary | ICD-10-CM | POA: Diagnosis not present

## 2021-01-23 DIAGNOSIS — M5136 Other intervertebral disc degeneration, lumbar region: Secondary | ICD-10-CM | POA: Diagnosis not present

## 2021-01-23 DIAGNOSIS — M539 Dorsopathy, unspecified: Secondary | ICD-10-CM | POA: Diagnosis not present

## 2021-01-23 DIAGNOSIS — M25559 Pain in unspecified hip: Secondary | ICD-10-CM | POA: Diagnosis not present

## 2021-01-23 DIAGNOSIS — Z79899 Other long term (current) drug therapy: Secondary | ICD-10-CM | POA: Diagnosis not present

## 2021-01-24 ENCOUNTER — Other Ambulatory Visit (HOSPITAL_COMMUNITY): Payer: Self-pay | Admitting: Family Medicine

## 2021-01-24 DIAGNOSIS — R051 Acute cough: Secondary | ICD-10-CM

## 2021-01-24 DIAGNOSIS — J393 Upper respiratory tract hypersensitivity reaction, site unspecified: Secondary | ICD-10-CM

## 2021-02-21 DIAGNOSIS — M5136 Other intervertebral disc degeneration, lumbar region: Secondary | ICD-10-CM | POA: Diagnosis not present

## 2021-02-21 DIAGNOSIS — M25559 Pain in unspecified hip: Secondary | ICD-10-CM | POA: Diagnosis not present

## 2021-02-21 DIAGNOSIS — M539 Dorsopathy, unspecified: Secondary | ICD-10-CM | POA: Diagnosis not present

## 2021-02-21 DIAGNOSIS — Z79899 Other long term (current) drug therapy: Secondary | ICD-10-CM | POA: Diagnosis not present

## 2021-03-22 DIAGNOSIS — M5136 Other intervertebral disc degeneration, lumbar region: Secondary | ICD-10-CM | POA: Diagnosis not present

## 2021-03-22 DIAGNOSIS — Z79899 Other long term (current) drug therapy: Secondary | ICD-10-CM | POA: Diagnosis not present

## 2021-03-22 DIAGNOSIS — M25559 Pain in unspecified hip: Secondary | ICD-10-CM | POA: Diagnosis not present

## 2021-03-22 DIAGNOSIS — M539 Dorsopathy, unspecified: Secondary | ICD-10-CM | POA: Diagnosis not present

## 2021-04-22 DIAGNOSIS — M539 Dorsopathy, unspecified: Secondary | ICD-10-CM | POA: Diagnosis not present

## 2021-04-22 DIAGNOSIS — M5136 Other intervertebral disc degeneration, lumbar region: Secondary | ICD-10-CM | POA: Diagnosis not present

## 2021-04-22 DIAGNOSIS — M25559 Pain in unspecified hip: Secondary | ICD-10-CM | POA: Diagnosis not present

## 2021-04-22 DIAGNOSIS — Z79899 Other long term (current) drug therapy: Secondary | ICD-10-CM | POA: Diagnosis not present

## 2021-04-22 DIAGNOSIS — I10 Essential (primary) hypertension: Secondary | ICD-10-CM | POA: Diagnosis not present

## 2021-04-29 DIAGNOSIS — R600 Localized edema: Secondary | ICD-10-CM | POA: Insufficient documentation

## 2021-04-29 DIAGNOSIS — I10 Essential (primary) hypertension: Secondary | ICD-10-CM | POA: Diagnosis not present

## 2021-05-20 DIAGNOSIS — M25559 Pain in unspecified hip: Secondary | ICD-10-CM | POA: Diagnosis not present

## 2021-05-20 DIAGNOSIS — I10 Essential (primary) hypertension: Secondary | ICD-10-CM | POA: Diagnosis not present

## 2021-05-20 DIAGNOSIS — M5136 Other intervertebral disc degeneration, lumbar region: Secondary | ICD-10-CM | POA: Diagnosis not present

## 2021-05-20 DIAGNOSIS — M539 Dorsopathy, unspecified: Secondary | ICD-10-CM | POA: Diagnosis not present

## 2021-05-20 DIAGNOSIS — Z79899 Other long term (current) drug therapy: Secondary | ICD-10-CM | POA: Diagnosis not present

## 2021-06-12 DIAGNOSIS — I10 Essential (primary) hypertension: Secondary | ICD-10-CM | POA: Diagnosis not present

## 2021-06-18 DIAGNOSIS — M5136 Other intervertebral disc degeneration, lumbar region: Secondary | ICD-10-CM | POA: Diagnosis not present

## 2021-06-18 DIAGNOSIS — M539 Dorsopathy, unspecified: Secondary | ICD-10-CM | POA: Diagnosis not present

## 2021-06-18 DIAGNOSIS — Z0000 Encounter for general adult medical examination without abnormal findings: Secondary | ICD-10-CM | POA: Diagnosis not present

## 2021-06-18 DIAGNOSIS — I10 Essential (primary) hypertension: Secondary | ICD-10-CM | POA: Diagnosis not present

## 2021-06-18 DIAGNOSIS — Z79899 Other long term (current) drug therapy: Secondary | ICD-10-CM | POA: Diagnosis not present

## 2021-06-19 DIAGNOSIS — Z79899 Other long term (current) drug therapy: Secondary | ICD-10-CM | POA: Diagnosis not present

## 2021-06-21 DIAGNOSIS — J069 Acute upper respiratory infection, unspecified: Secondary | ICD-10-CM | POA: Diagnosis not present

## 2021-06-26 ENCOUNTER — Other Ambulatory Visit (HOSPITAL_COMMUNITY): Payer: Self-pay | Admitting: Internal Medicine

## 2021-06-26 ENCOUNTER — Ambulatory Visit (HOSPITAL_COMMUNITY)
Admission: RE | Admit: 2021-06-26 | Discharge: 2021-06-26 | Disposition: A | Discharge status: Home / Self Care | Payer: Medicare Other | Source: Ambulatory Visit | Attending: Internal Medicine | Admitting: Internal Medicine

## 2021-06-26 ENCOUNTER — Other Ambulatory Visit: Payer: Self-pay

## 2021-06-26 DIAGNOSIS — R059 Cough, unspecified: Secondary | ICD-10-CM | POA: Diagnosis not present

## 2021-06-26 DIAGNOSIS — J9811 Atelectasis: Secondary | ICD-10-CM | POA: Diagnosis not present

## 2021-06-26 DIAGNOSIS — R0981 Nasal congestion: Secondary | ICD-10-CM | POA: Diagnosis not present

## 2021-07-04 DIAGNOSIS — D509 Iron deficiency anemia, unspecified: Secondary | ICD-10-CM | POA: Insufficient documentation

## 2021-07-04 DIAGNOSIS — N1831 Chronic kidney disease, stage 3a: Secondary | ICD-10-CM | POA: Insufficient documentation

## 2021-07-05 DIAGNOSIS — I6529 Occlusion and stenosis of unspecified carotid artery: Secondary | ICD-10-CM | POA: Diagnosis not present

## 2021-07-05 DIAGNOSIS — N1831 Chronic kidney disease, stage 3a: Secondary | ICD-10-CM | POA: Diagnosis not present

## 2021-07-05 DIAGNOSIS — G8929 Other chronic pain: Secondary | ICD-10-CM | POA: Diagnosis not present

## 2021-07-05 DIAGNOSIS — D509 Iron deficiency anemia, unspecified: Secondary | ICD-10-CM | POA: Diagnosis not present

## 2021-07-17 DIAGNOSIS — I10 Essential (primary) hypertension: Secondary | ICD-10-CM | POA: Diagnosis not present

## 2021-07-17 DIAGNOSIS — M5136 Other intervertebral disc degeneration, lumbar region: Secondary | ICD-10-CM | POA: Diagnosis not present

## 2021-07-17 DIAGNOSIS — Z79899 Other long term (current) drug therapy: Secondary | ICD-10-CM | POA: Diagnosis not present

## 2021-07-17 DIAGNOSIS — M539 Dorsopathy, unspecified: Secondary | ICD-10-CM | POA: Diagnosis not present

## 2021-07-18 DIAGNOSIS — Z79899 Other long term (current) drug therapy: Secondary | ICD-10-CM | POA: Diagnosis not present

## 2021-08-14 DIAGNOSIS — M539 Dorsopathy, unspecified: Secondary | ICD-10-CM | POA: Diagnosis not present

## 2021-08-14 DIAGNOSIS — M5136 Other intervertebral disc degeneration, lumbar region: Secondary | ICD-10-CM | POA: Diagnosis not present

## 2021-08-14 DIAGNOSIS — I10 Essential (primary) hypertension: Secondary | ICD-10-CM | POA: Diagnosis not present

## 2021-08-14 DIAGNOSIS — Z79899 Other long term (current) drug therapy: Secondary | ICD-10-CM | POA: Diagnosis not present

## 2021-08-15 DIAGNOSIS — Z79899 Other long term (current) drug therapy: Secondary | ICD-10-CM | POA: Diagnosis not present

## 2021-09-11 DIAGNOSIS — Z79899 Other long term (current) drug therapy: Secondary | ICD-10-CM | POA: Diagnosis not present

## 2021-09-11 DIAGNOSIS — M5136 Other intervertebral disc degeneration, lumbar region: Secondary | ICD-10-CM | POA: Diagnosis not present

## 2021-09-11 DIAGNOSIS — M539 Dorsopathy, unspecified: Secondary | ICD-10-CM | POA: Diagnosis not present

## 2021-09-11 DIAGNOSIS — I10 Essential (primary) hypertension: Secondary | ICD-10-CM | POA: Diagnosis not present

## 2021-09-16 DIAGNOSIS — Z79899 Other long term (current) drug therapy: Secondary | ICD-10-CM | POA: Diagnosis not present

## 2021-10-09 DIAGNOSIS — I10 Essential (primary) hypertension: Secondary | ICD-10-CM | POA: Diagnosis not present

## 2021-10-09 DIAGNOSIS — M25559 Pain in unspecified hip: Secondary | ICD-10-CM | POA: Diagnosis not present

## 2021-10-09 DIAGNOSIS — M539 Dorsopathy, unspecified: Secondary | ICD-10-CM | POA: Diagnosis not present

## 2021-10-09 DIAGNOSIS — Z79899 Other long term (current) drug therapy: Secondary | ICD-10-CM | POA: Diagnosis not present

## 2021-10-09 DIAGNOSIS — M5136 Other intervertebral disc degeneration, lumbar region: Secondary | ICD-10-CM | POA: Diagnosis not present

## 2021-10-10 DIAGNOSIS — I499 Cardiac arrhythmia, unspecified: Secondary | ICD-10-CM | POA: Diagnosis not present

## 2021-10-10 DIAGNOSIS — R079 Chest pain, unspecified: Secondary | ICD-10-CM | POA: Diagnosis not present

## 2021-10-10 DIAGNOSIS — R944 Abnormal results of kidney function studies: Secondary | ICD-10-CM | POA: Diagnosis not present

## 2021-10-10 DIAGNOSIS — I509 Heart failure, unspecified: Secondary | ICD-10-CM | POA: Diagnosis not present

## 2021-10-10 DIAGNOSIS — I517 Cardiomegaly: Secondary | ICD-10-CM | POA: Diagnosis not present

## 2021-10-10 DIAGNOSIS — I110 Hypertensive heart disease with heart failure: Secondary | ICD-10-CM | POA: Diagnosis not present

## 2021-10-10 DIAGNOSIS — R0789 Other chest pain: Secondary | ICD-10-CM | POA: Diagnosis not present

## 2021-10-10 DIAGNOSIS — J811 Chronic pulmonary edema: Secondary | ICD-10-CM | POA: Diagnosis not present

## 2021-10-10 DIAGNOSIS — K219 Gastro-esophageal reflux disease without esophagitis: Secondary | ICD-10-CM | POA: Diagnosis not present

## 2021-10-10 DIAGNOSIS — R7989 Other specified abnormal findings of blood chemistry: Secondary | ICD-10-CM | POA: Diagnosis not present

## 2021-10-10 DIAGNOSIS — Z743 Need for continuous supervision: Secondary | ICD-10-CM | POA: Diagnosis not present

## 2021-11-07 DIAGNOSIS — M539 Dorsopathy, unspecified: Secondary | ICD-10-CM | POA: Diagnosis not present

## 2021-11-07 DIAGNOSIS — I10 Essential (primary) hypertension: Secondary | ICD-10-CM | POA: Diagnosis not present

## 2021-11-07 DIAGNOSIS — M5136 Other intervertebral disc degeneration, lumbar region: Secondary | ICD-10-CM | POA: Diagnosis not present

## 2021-11-07 DIAGNOSIS — Z79899 Other long term (current) drug therapy: Secondary | ICD-10-CM | POA: Diagnosis not present

## 2021-12-05 DIAGNOSIS — M5136 Other intervertebral disc degeneration, lumbar region: Secondary | ICD-10-CM | POA: Diagnosis not present

## 2021-12-05 DIAGNOSIS — M25559 Pain in unspecified hip: Secondary | ICD-10-CM | POA: Diagnosis not present

## 2021-12-05 DIAGNOSIS — Z79899 Other long term (current) drug therapy: Secondary | ICD-10-CM | POA: Diagnosis not present

## 2021-12-05 DIAGNOSIS — I10 Essential (primary) hypertension: Secondary | ICD-10-CM | POA: Diagnosis not present

## 2021-12-05 DIAGNOSIS — M539 Dorsopathy, unspecified: Secondary | ICD-10-CM | POA: Diagnosis not present

## 2021-12-07 IMAGING — MR MR HEAD W/O CM
8 of 10 series · 34 of 48 positions shown · non-contrast
Comparison: None.

CLINICAL DATA: Ataxia, stroke suspected no

EXAM:
MRI HEAD WITHOUT CONTRAST
TECHNIQUE: Multiplanar, multiecho pulse sequences of the brain and surrounding
structures were obtained without intravenous contrast.

[Series 2: T1 · sagittal · 5.0mm · 0.45mm/px · 3 of 20 slices shown (1 of 2)]
[im 1/20]
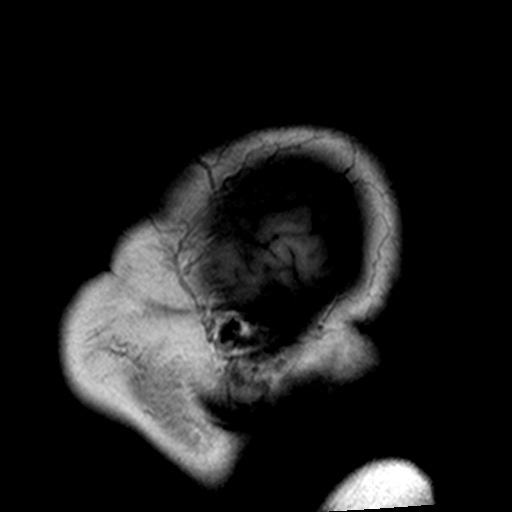
[im 10/20]
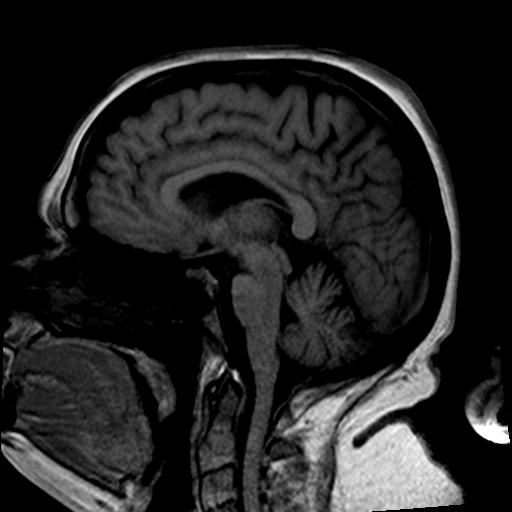
[im 20/20]
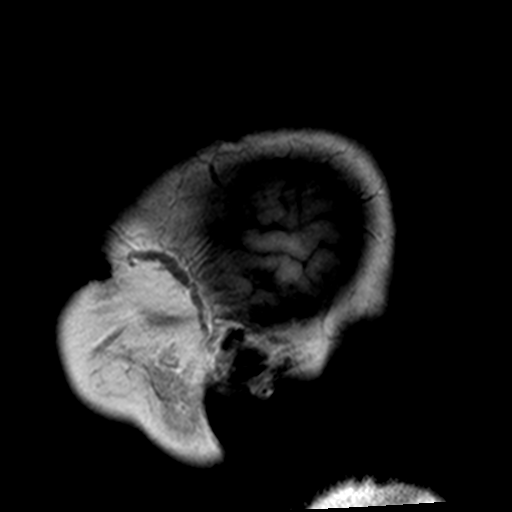

[Series 3: DWI · axial · 3.0mm · 0.78mm/px · z∈[-37,+120]mm · 6 of 54 slices shown (1 of 2)]
[im 1/54]
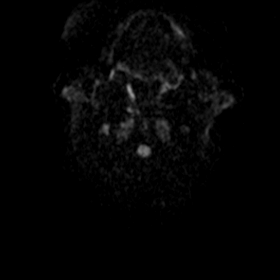
[im 11/54]
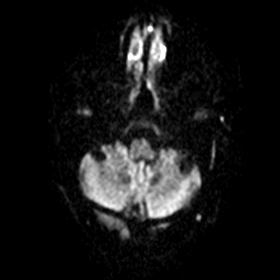
[im 22/54]
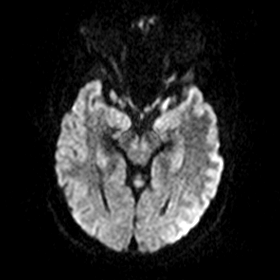
[im 32/54]
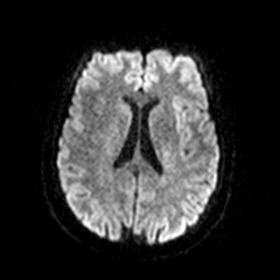
[im 43/54]
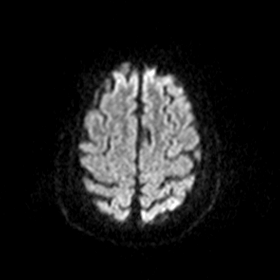
[im 54/54]
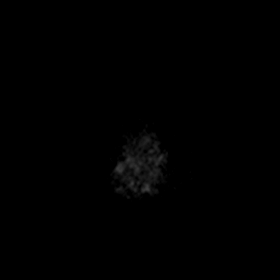

[Series 5: DWI · coronal · 5.0mm · 0.50mm/px · 4 of 34 slices shown (2 of 2)]
[im 1/34]
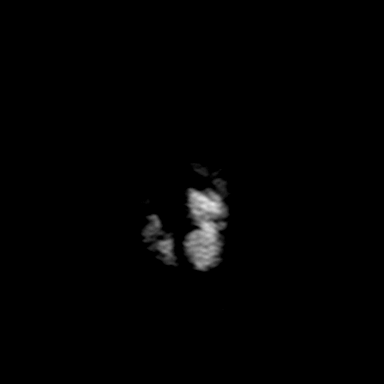
[im 12/34]
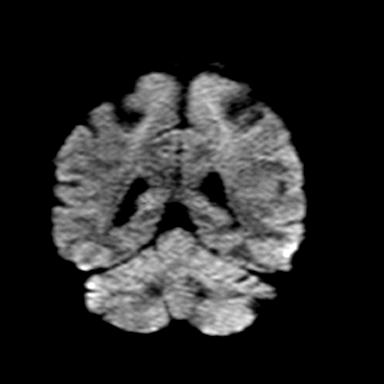
[im 23/34]
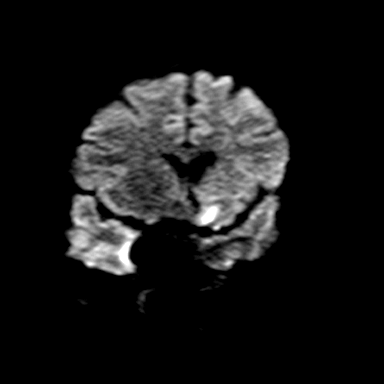
[im 34/34]
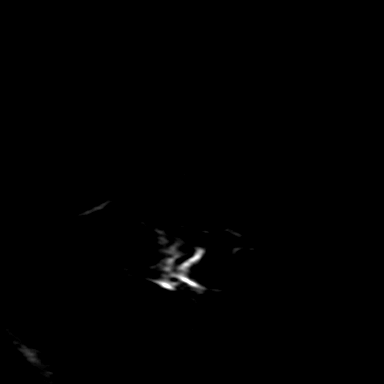

[Series 7: T2 · axial · 5.0mm · 0.70mm/px · z∈[-30,+111]mm · 3 of 23 slices shown (1 of 3)]
[im 1/23]
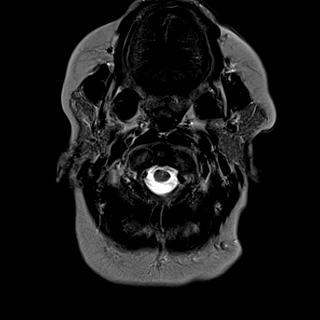
[im 12/23]
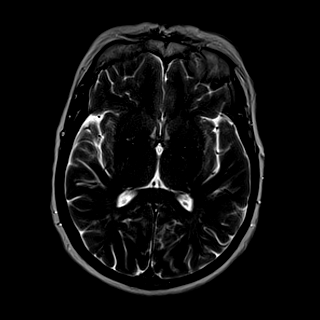
[im 23/23]
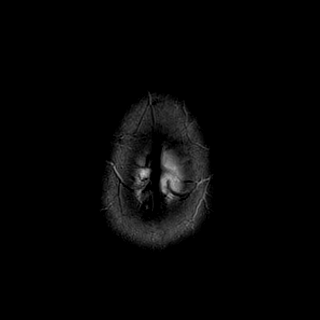

[Series 8: T2 · axial · 5.0mm · 0.42mm/px · z∈[-25,+104]mm · 2 of 21 slices shown (2 of 3)]
[im 1/21]
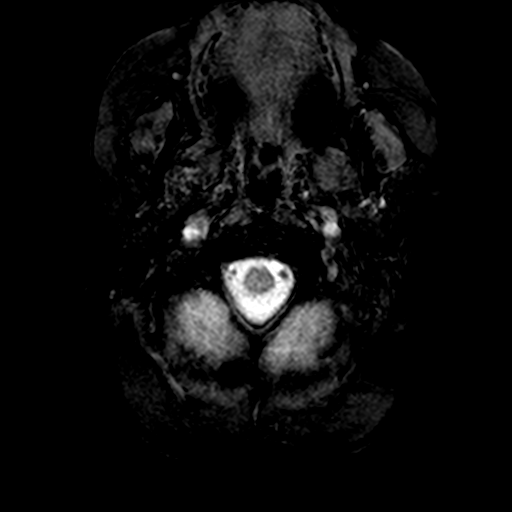
[im 21/21]
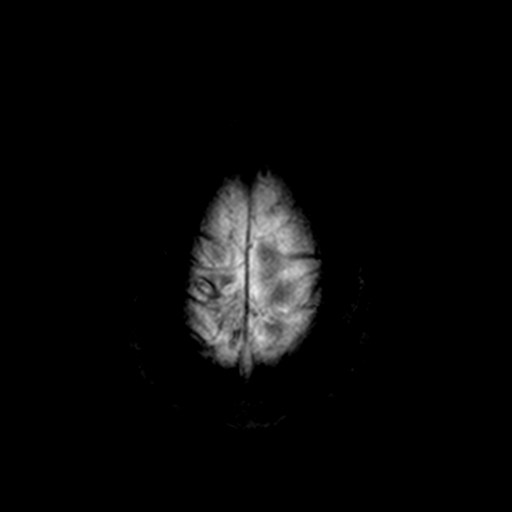

[Series 9: FLAIR · axial · 3.0mm · 0.87mm/px · z∈[-28,+109]mm · 6 of 47 slices shown]
[im 1/47]
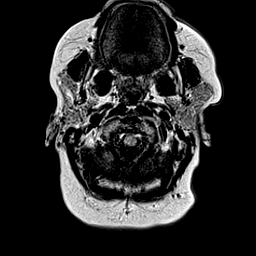
[im 10/47]
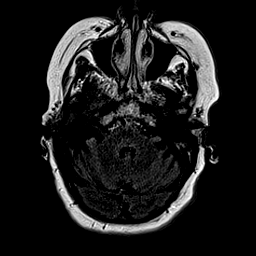
[im 19/47]
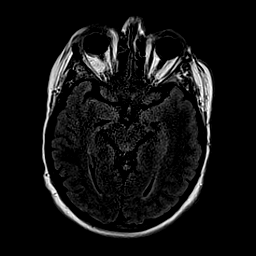
[im 28/47]
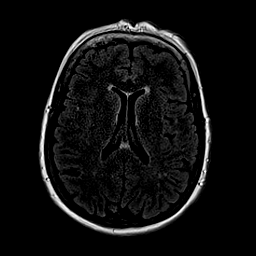
[im 37/47]
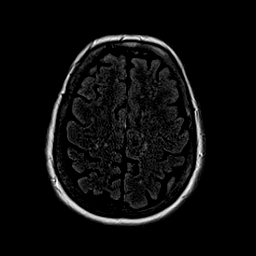
[im 47/47]
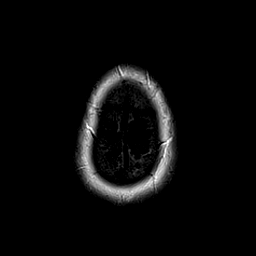

[Series 10: T1 · axial · 2.0mm · 0.47mm/px · z∈[-61,+84]mm · 7 of 93 slices shown (2 of 2)]
[im 1/93]
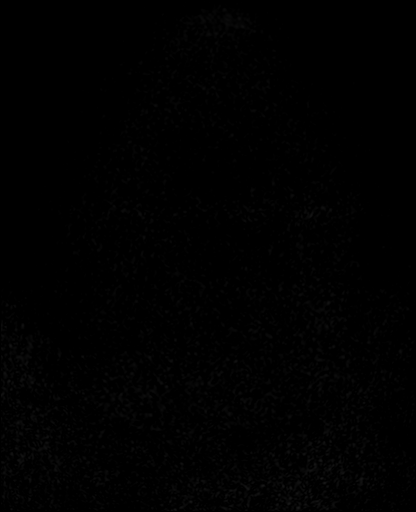
[im 19/93]
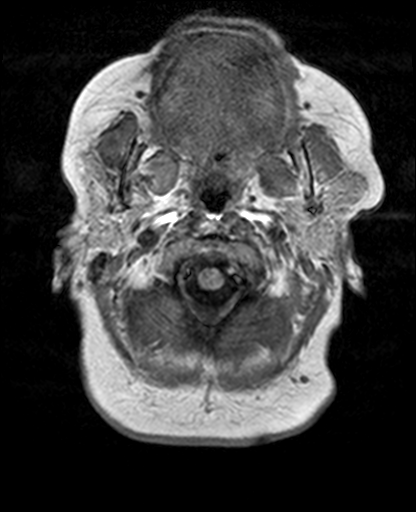
[im 28/93]
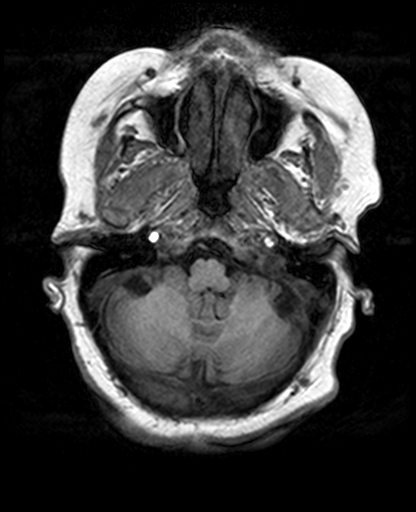
[im 37/93]
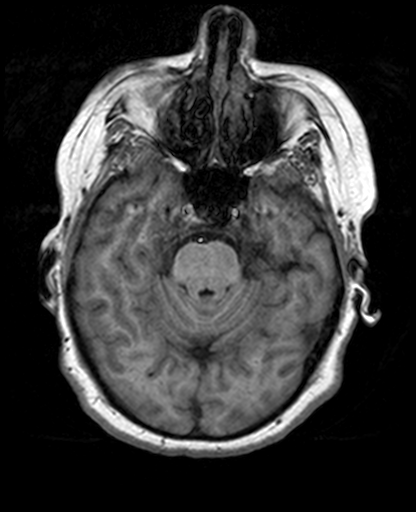
[im 56/93]
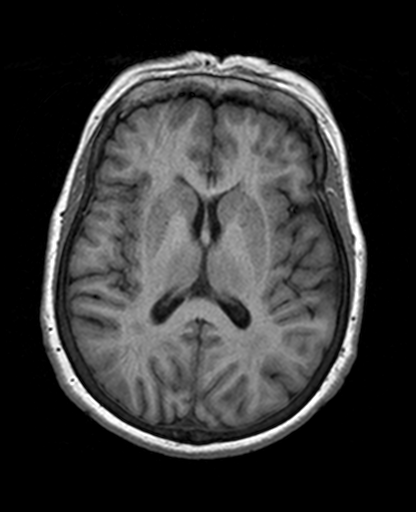
[im 65/93]
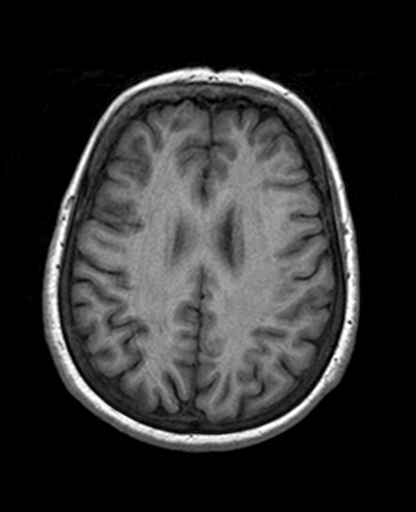
[im 74/93]
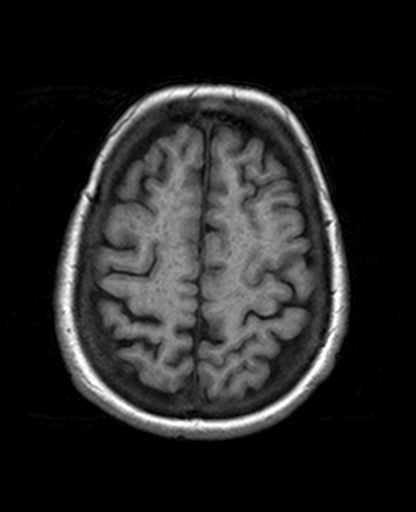

[Series 11: T2 · coronal · 5.0mm · 0.64mm/px · 3 of 28 slices shown (3 of 3)]
[im 1/28]
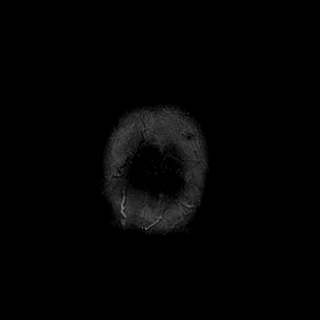
[im 14/28]
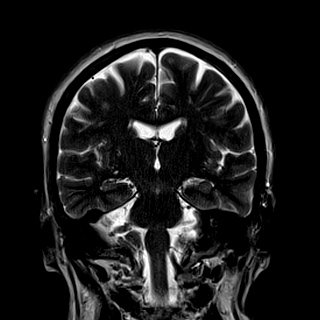
[im 28/28]
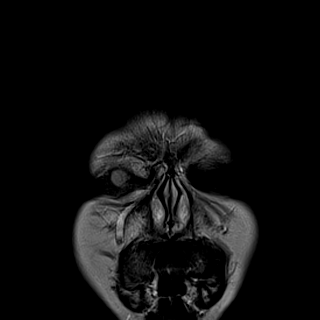

[34 of 48 positions shown; findings below may reference images not displayed]

FINDINGS: Brain: No acute infarction, hemorrhage, hydrocephalus, extra-axial
collection or mass lesion. A few scattered foci of T2 hyperintensity
are seen within the white matter of the cerebral hemispheres,
nonspecific, most likely related to chronic small vessel ischemia.

Vascular: Normal flow voids.

Skull and upper cervical spine: Normal marrow signal.

Sinuses/Orbits: Small amount of fluid in the left mastoid cells.

Other: None.
IMPRESSION: 1. No acute intracranial abnormality.
2. Few scattered foci of T2 hyperintensity within the white matter
of the cerebral hemispheres, nonspecific, most likely related to
chronic small vessel ischemia.

## 2021-12-18 DIAGNOSIS — D509 Iron deficiency anemia, unspecified: Secondary | ICD-10-CM | POA: Diagnosis not present

## 2021-12-18 DIAGNOSIS — I10 Essential (primary) hypertension: Secondary | ICD-10-CM | POA: Diagnosis not present

## 2021-12-19 DIAGNOSIS — Z1231 Encounter for screening mammogram for malignant neoplasm of breast: Secondary | ICD-10-CM | POA: Diagnosis not present

## 2021-12-19 DIAGNOSIS — I6529 Occlusion and stenosis of unspecified carotid artery: Secondary | ICD-10-CM | POA: Diagnosis not present

## 2021-12-19 DIAGNOSIS — G8929 Other chronic pain: Secondary | ICD-10-CM | POA: Diagnosis not present

## 2021-12-19 DIAGNOSIS — R011 Cardiac murmur, unspecified: Secondary | ICD-10-CM | POA: Insufficient documentation

## 2021-12-19 DIAGNOSIS — I10 Essential (primary) hypertension: Secondary | ICD-10-CM | POA: Diagnosis not present

## 2021-12-19 DIAGNOSIS — D509 Iron deficiency anemia, unspecified: Secondary | ICD-10-CM | POA: Diagnosis not present

## 2021-12-19 DIAGNOSIS — F329 Major depressive disorder, single episode, unspecified: Secondary | ICD-10-CM | POA: Insufficient documentation

## 2021-12-19 DIAGNOSIS — N1831 Chronic kidney disease, stage 3a: Secondary | ICD-10-CM | POA: Diagnosis not present

## 2022-01-02 DIAGNOSIS — M5136 Other intervertebral disc degeneration, lumbar region: Secondary | ICD-10-CM | POA: Diagnosis not present

## 2022-01-02 DIAGNOSIS — M539 Dorsopathy, unspecified: Secondary | ICD-10-CM | POA: Diagnosis not present

## 2022-01-02 DIAGNOSIS — Z79899 Other long term (current) drug therapy: Secondary | ICD-10-CM | POA: Diagnosis not present

## 2022-01-02 DIAGNOSIS — I10 Essential (primary) hypertension: Secondary | ICD-10-CM | POA: Diagnosis not present

## 2022-01-03 DIAGNOSIS — I6529 Occlusion and stenosis of unspecified carotid artery: Secondary | ICD-10-CM | POA: Diagnosis not present

## 2022-01-03 DIAGNOSIS — I10 Essential (primary) hypertension: Secondary | ICD-10-CM | POA: Diagnosis not present

## 2022-01-03 DIAGNOSIS — R011 Cardiac murmur, unspecified: Secondary | ICD-10-CM | POA: Diagnosis not present

## 2022-01-06 DIAGNOSIS — Z79899 Other long term (current) drug therapy: Secondary | ICD-10-CM | POA: Diagnosis not present

## 2022-01-17 DIAGNOSIS — H905 Unspecified sensorineural hearing loss: Secondary | ICD-10-CM | POA: Diagnosis not present

## 2022-01-31 DIAGNOSIS — M539 Dorsopathy, unspecified: Secondary | ICD-10-CM | POA: Diagnosis not present

## 2022-01-31 DIAGNOSIS — M5136 Other intervertebral disc degeneration, lumbar region: Secondary | ICD-10-CM | POA: Diagnosis not present

## 2022-01-31 DIAGNOSIS — Z79899 Other long term (current) drug therapy: Secondary | ICD-10-CM | POA: Diagnosis not present

## 2022-01-31 DIAGNOSIS — I10 Essential (primary) hypertension: Secondary | ICD-10-CM | POA: Diagnosis not present

## 2022-02-03 DIAGNOSIS — Z79899 Other long term (current) drug therapy: Secondary | ICD-10-CM | POA: Diagnosis not present

## 2022-02-05 DIAGNOSIS — R9431 Abnormal electrocardiogram [ECG] [EKG]: Secondary | ICD-10-CM | POA: Diagnosis not present

## 2022-02-05 DIAGNOSIS — I493 Ventricular premature depolarization: Secondary | ICD-10-CM | POA: Diagnosis not present

## 2022-02-05 DIAGNOSIS — I10 Essential (primary) hypertension: Secondary | ICD-10-CM | POA: Diagnosis not present

## 2022-02-05 DIAGNOSIS — R079 Chest pain, unspecified: Secondary | ICD-10-CM | POA: Diagnosis not present

## 2022-02-10 DIAGNOSIS — R252 Cramp and spasm: Secondary | ICD-10-CM | POA: Insufficient documentation

## 2022-02-10 DIAGNOSIS — R001 Bradycardia, unspecified: Secondary | ICD-10-CM | POA: Insufficient documentation

## 2022-02-10 DIAGNOSIS — R600 Localized edema: Secondary | ICD-10-CM | POA: Insufficient documentation

## 2022-03-05 DIAGNOSIS — M5136 Other intervertebral disc degeneration, lumbar region: Secondary | ICD-10-CM | POA: Diagnosis not present

## 2022-03-05 DIAGNOSIS — Z79899 Other long term (current) drug therapy: Secondary | ICD-10-CM | POA: Diagnosis not present

## 2022-03-05 DIAGNOSIS — M539 Dorsopathy, unspecified: Secondary | ICD-10-CM | POA: Diagnosis not present

## 2022-03-05 DIAGNOSIS — I10 Essential (primary) hypertension: Secondary | ICD-10-CM | POA: Diagnosis not present

## 2022-03-08 DIAGNOSIS — K529 Noninfective gastroenteritis and colitis, unspecified: Secondary | ICD-10-CM | POA: Diagnosis not present

## 2022-03-08 DIAGNOSIS — Z888 Allergy status to other drugs, medicaments and biological substances status: Secondary | ICD-10-CM | POA: Diagnosis not present

## 2022-03-08 DIAGNOSIS — R109 Unspecified abdominal pain: Secondary | ICD-10-CM | POA: Diagnosis not present

## 2022-03-08 DIAGNOSIS — R1110 Vomiting, unspecified: Secondary | ICD-10-CM | POA: Diagnosis not present

## 2022-03-08 DIAGNOSIS — I700 Atherosclerosis of aorta: Secondary | ICD-10-CM | POA: Diagnosis not present

## 2022-03-08 DIAGNOSIS — R112 Nausea with vomiting, unspecified: Secondary | ICD-10-CM | POA: Diagnosis not present

## 2022-03-08 DIAGNOSIS — Z20822 Contact with and (suspected) exposure to covid-19: Secondary | ICD-10-CM | POA: Diagnosis not present

## 2022-03-08 DIAGNOSIS — Z9884 Bariatric surgery status: Secondary | ICD-10-CM | POA: Diagnosis not present

## 2022-03-08 DIAGNOSIS — K5730 Diverticulosis of large intestine without perforation or abscess without bleeding: Secondary | ICD-10-CM | POA: Diagnosis not present

## 2022-03-08 DIAGNOSIS — Z885 Allergy status to narcotic agent status: Secondary | ICD-10-CM | POA: Diagnosis not present

## 2022-03-25 DIAGNOSIS — Z1231 Encounter for screening mammogram for malignant neoplasm of breast: Secondary | ICD-10-CM | POA: Diagnosis not present

## 2022-04-02 DIAGNOSIS — M539 Dorsopathy, unspecified: Secondary | ICD-10-CM | POA: Diagnosis not present

## 2022-04-02 DIAGNOSIS — M5136 Other intervertebral disc degeneration, lumbar region: Secondary | ICD-10-CM | POA: Diagnosis not present

## 2022-04-02 DIAGNOSIS — I10 Essential (primary) hypertension: Secondary | ICD-10-CM | POA: Diagnosis not present

## 2022-04-02 DIAGNOSIS — Z79899 Other long term (current) drug therapy: Secondary | ICD-10-CM | POA: Diagnosis not present

## 2022-04-03 DIAGNOSIS — I10 Essential (primary) hypertension: Secondary | ICD-10-CM | POA: Diagnosis not present

## 2022-04-03 DIAGNOSIS — R002 Palpitations: Secondary | ICD-10-CM | POA: Diagnosis not present

## 2022-04-03 DIAGNOSIS — R079 Chest pain, unspecified: Secondary | ICD-10-CM | POA: Diagnosis not present

## 2022-04-23 DIAGNOSIS — R002 Palpitations: Secondary | ICD-10-CM | POA: Diagnosis not present

## 2022-04-28 DIAGNOSIS — R079 Chest pain, unspecified: Secondary | ICD-10-CM | POA: Diagnosis not present

## 2022-04-30 DIAGNOSIS — M539 Dorsopathy, unspecified: Secondary | ICD-10-CM | POA: Diagnosis not present

## 2022-04-30 DIAGNOSIS — M5136 Other intervertebral disc degeneration, lumbar region: Secondary | ICD-10-CM | POA: Diagnosis not present

## 2022-04-30 DIAGNOSIS — I10 Essential (primary) hypertension: Secondary | ICD-10-CM | POA: Diagnosis not present

## 2022-04-30 DIAGNOSIS — Z79899 Other long term (current) drug therapy: Secondary | ICD-10-CM | POA: Diagnosis not present

## 2022-05-05 DIAGNOSIS — E7800 Pure hypercholesterolemia, unspecified: Secondary | ICD-10-CM | POA: Diagnosis not present

## 2022-05-05 DIAGNOSIS — R079 Chest pain, unspecified: Secondary | ICD-10-CM | POA: Diagnosis not present

## 2022-05-05 DIAGNOSIS — R002 Palpitations: Secondary | ICD-10-CM | POA: Diagnosis not present

## 2022-05-05 DIAGNOSIS — R600 Localized edema: Secondary | ICD-10-CM | POA: Diagnosis not present

## 2022-05-05 DIAGNOSIS — I10 Essential (primary) hypertension: Secondary | ICD-10-CM | POA: Diagnosis not present

## 2022-05-28 DIAGNOSIS — I10 Essential (primary) hypertension: Secondary | ICD-10-CM | POA: Diagnosis not present

## 2022-05-28 DIAGNOSIS — M539 Dorsopathy, unspecified: Secondary | ICD-10-CM | POA: Diagnosis not present

## 2022-05-28 DIAGNOSIS — Z79899 Other long term (current) drug therapy: Secondary | ICD-10-CM | POA: Diagnosis not present

## 2022-05-28 DIAGNOSIS — M5136 Other intervertebral disc degeneration, lumbar region: Secondary | ICD-10-CM | POA: Diagnosis not present

## 2022-05-28 DIAGNOSIS — M25559 Pain in unspecified hip: Secondary | ICD-10-CM | POA: Diagnosis not present

## 2022-06-13 DIAGNOSIS — I10 Essential (primary) hypertension: Secondary | ICD-10-CM | POA: Diagnosis not present

## 2022-06-13 DIAGNOSIS — W08XXXA Fall from other furniture, initial encounter: Secondary | ICD-10-CM | POA: Diagnosis not present

## 2022-06-13 DIAGNOSIS — S299XXA Unspecified injury of thorax, initial encounter: Secondary | ICD-10-CM | POA: Diagnosis not present

## 2022-06-13 DIAGNOSIS — I517 Cardiomegaly: Secondary | ICD-10-CM | POA: Diagnosis not present

## 2022-06-13 DIAGNOSIS — Z79899 Other long term (current) drug therapy: Secondary | ICD-10-CM | POA: Diagnosis not present

## 2022-06-13 DIAGNOSIS — Z885 Allergy status to narcotic agent status: Secondary | ICD-10-CM | POA: Diagnosis not present

## 2022-06-13 DIAGNOSIS — S2020XA Contusion of thorax, unspecified, initial encounter: Secondary | ICD-10-CM | POA: Diagnosis not present

## 2022-06-13 DIAGNOSIS — M47814 Spondylosis without myelopathy or radiculopathy, thoracic region: Secondary | ICD-10-CM | POA: Diagnosis not present

## 2022-06-13 DIAGNOSIS — Z888 Allergy status to other drugs, medicaments and biological substances status: Secondary | ICD-10-CM | POA: Diagnosis not present

## 2022-06-25 DIAGNOSIS — M5136 Other intervertebral disc degeneration, lumbar region: Secondary | ICD-10-CM | POA: Diagnosis not present

## 2022-06-25 DIAGNOSIS — Z79899 Other long term (current) drug therapy: Secondary | ICD-10-CM | POA: Diagnosis not present

## 2022-06-25 DIAGNOSIS — M25559 Pain in unspecified hip: Secondary | ICD-10-CM | POA: Diagnosis not present

## 2022-06-25 DIAGNOSIS — I10 Essential (primary) hypertension: Secondary | ICD-10-CM | POA: Diagnosis not present

## 2022-06-25 DIAGNOSIS — M539 Dorsopathy, unspecified: Secondary | ICD-10-CM | POA: Diagnosis not present

## 2022-06-26 DIAGNOSIS — Z79899 Other long term (current) drug therapy: Secondary | ICD-10-CM | POA: Diagnosis not present

## 2022-07-07 DIAGNOSIS — Z136 Encounter for screening for cardiovascular disorders: Secondary | ICD-10-CM | POA: Diagnosis not present

## 2022-07-07 DIAGNOSIS — Z0000 Encounter for general adult medical examination without abnormal findings: Secondary | ICD-10-CM | POA: Diagnosis not present

## 2022-07-23 DIAGNOSIS — M5136 Other intervertebral disc degeneration, lumbar region: Secondary | ICD-10-CM | POA: Diagnosis not present

## 2022-07-23 DIAGNOSIS — I10 Essential (primary) hypertension: Secondary | ICD-10-CM | POA: Diagnosis not present

## 2022-07-23 DIAGNOSIS — Z79899 Other long term (current) drug therapy: Secondary | ICD-10-CM | POA: Diagnosis not present

## 2022-07-23 DIAGNOSIS — M25559 Pain in unspecified hip: Secondary | ICD-10-CM | POA: Diagnosis not present

## 2022-07-23 DIAGNOSIS — M539 Dorsopathy, unspecified: Secondary | ICD-10-CM | POA: Diagnosis not present

## 2022-07-24 DIAGNOSIS — N1831 Chronic kidney disease, stage 3a: Secondary | ICD-10-CM | POA: Diagnosis not present

## 2022-07-24 DIAGNOSIS — G8929 Other chronic pain: Secondary | ICD-10-CM | POA: Diagnosis not present

## 2022-07-24 DIAGNOSIS — Z23 Encounter for immunization: Secondary | ICD-10-CM | POA: Diagnosis not present

## 2022-07-24 DIAGNOSIS — I6529 Occlusion and stenosis of unspecified carotid artery: Secondary | ICD-10-CM | POA: Diagnosis not present

## 2022-07-24 DIAGNOSIS — D509 Iron deficiency anemia, unspecified: Secondary | ICD-10-CM | POA: Diagnosis not present

## 2022-07-24 DIAGNOSIS — R011 Cardiac murmur, unspecified: Secondary | ICD-10-CM | POA: Diagnosis not present

## 2022-07-24 DIAGNOSIS — I10 Essential (primary) hypertension: Secondary | ICD-10-CM | POA: Diagnosis not present

## 2022-07-24 DIAGNOSIS — Z0000 Encounter for general adult medical examination without abnormal findings: Secondary | ICD-10-CM | POA: Diagnosis not present

## 2022-07-25 DIAGNOSIS — Z79899 Other long term (current) drug therapy: Secondary | ICD-10-CM | POA: Diagnosis not present

## 2022-07-31 DIAGNOSIS — I209 Angina pectoris, unspecified: Secondary | ICD-10-CM | POA: Diagnosis not present

## 2022-07-31 DIAGNOSIS — R0602 Shortness of breath: Secondary | ICD-10-CM | POA: Diagnosis not present

## 2022-07-31 DIAGNOSIS — I471 Supraventricular tachycardia: Secondary | ICD-10-CM | POA: Diagnosis not present

## 2022-07-31 DIAGNOSIS — R600 Localized edema: Secondary | ICD-10-CM | POA: Diagnosis not present

## 2022-07-31 DIAGNOSIS — R030 Elevated blood-pressure reading, without diagnosis of hypertension: Secondary | ICD-10-CM | POA: Diagnosis not present

## 2022-07-31 DIAGNOSIS — R002 Palpitations: Secondary | ICD-10-CM | POA: Diagnosis not present

## 2022-07-31 DIAGNOSIS — I10 Essential (primary) hypertension: Secondary | ICD-10-CM | POA: Diagnosis not present

## 2022-07-31 DIAGNOSIS — R9431 Abnormal electrocardiogram [ECG] [EKG]: Secondary | ICD-10-CM | POA: Diagnosis not present

## 2022-08-20 DIAGNOSIS — M5136 Other intervertebral disc degeneration, lumbar region: Secondary | ICD-10-CM | POA: Diagnosis not present

## 2022-08-20 DIAGNOSIS — I10 Essential (primary) hypertension: Secondary | ICD-10-CM | POA: Diagnosis not present

## 2022-08-20 DIAGNOSIS — M539 Dorsopathy, unspecified: Secondary | ICD-10-CM | POA: Diagnosis not present

## 2022-08-20 DIAGNOSIS — Z79899 Other long term (current) drug therapy: Secondary | ICD-10-CM | POA: Diagnosis not present

## 2022-08-20 DIAGNOSIS — M25559 Pain in unspecified hip: Secondary | ICD-10-CM | POA: Diagnosis not present

## 2022-09-12 DIAGNOSIS — F32A Depression, unspecified: Secondary | ICD-10-CM | POA: Diagnosis not present

## 2022-09-12 DIAGNOSIS — Z791 Long term (current) use of non-steroidal anti-inflammatories (NSAID): Secondary | ICD-10-CM | POA: Diagnosis not present

## 2022-09-12 DIAGNOSIS — I10 Essential (primary) hypertension: Secondary | ICD-10-CM | POA: Diagnosis not present

## 2022-09-12 DIAGNOSIS — X501XXA Overexertion from prolonged static or awkward postures, initial encounter: Secondary | ICD-10-CM | POA: Diagnosis not present

## 2022-09-12 DIAGNOSIS — M549 Dorsalgia, unspecified: Secondary | ICD-10-CM | POA: Diagnosis not present

## 2022-09-12 DIAGNOSIS — S76311A Strain of muscle, fascia and tendon of the posterior muscle group at thigh level, right thigh, initial encounter: Secondary | ICD-10-CM | POA: Diagnosis not present

## 2022-09-12 DIAGNOSIS — M25561 Pain in right knee: Secondary | ICD-10-CM | POA: Diagnosis not present

## 2022-09-12 DIAGNOSIS — Z79899 Other long term (current) drug therapy: Secondary | ICD-10-CM | POA: Diagnosis not present

## 2022-09-12 DIAGNOSIS — Z888 Allergy status to other drugs, medicaments and biological substances status: Secondary | ICD-10-CM | POA: Diagnosis not present

## 2022-09-12 DIAGNOSIS — G8929 Other chronic pain: Secondary | ICD-10-CM | POA: Diagnosis not present

## 2022-09-12 DIAGNOSIS — K219 Gastro-esophageal reflux disease without esophagitis: Secondary | ICD-10-CM | POA: Diagnosis not present

## 2022-09-12 DIAGNOSIS — Z885 Allergy status to narcotic agent status: Secondary | ICD-10-CM | POA: Diagnosis not present

## 2022-09-17 DIAGNOSIS — M539 Dorsopathy, unspecified: Secondary | ICD-10-CM | POA: Diagnosis not present

## 2022-09-17 DIAGNOSIS — M25559 Pain in unspecified hip: Secondary | ICD-10-CM | POA: Diagnosis not present

## 2022-09-17 DIAGNOSIS — I10 Essential (primary) hypertension: Secondary | ICD-10-CM | POA: Diagnosis not present

## 2022-09-17 DIAGNOSIS — M5136 Other intervertebral disc degeneration, lumbar region: Secondary | ICD-10-CM | POA: Diagnosis not present

## 2022-09-17 DIAGNOSIS — Z79899 Other long term (current) drug therapy: Secondary | ICD-10-CM | POA: Diagnosis not present

## 2022-09-19 DIAGNOSIS — Z79899 Other long term (current) drug therapy: Secondary | ICD-10-CM | POA: Diagnosis not present

## 2022-09-25 DIAGNOSIS — M549 Dorsalgia, unspecified: Secondary | ICD-10-CM | POA: Diagnosis not present

## 2022-09-25 DIAGNOSIS — Z888 Allergy status to other drugs, medicaments and biological substances status: Secondary | ICD-10-CM | POA: Diagnosis not present

## 2022-09-25 DIAGNOSIS — I119 Hypertensive heart disease without heart failure: Secondary | ICD-10-CM | POA: Diagnosis not present

## 2022-09-25 DIAGNOSIS — R0602 Shortness of breath: Secondary | ICD-10-CM | POA: Diagnosis not present

## 2022-09-25 DIAGNOSIS — R001 Bradycardia, unspecified: Secondary | ICD-10-CM | POA: Diagnosis not present

## 2022-09-25 DIAGNOSIS — I10 Essential (primary) hypertension: Secondary | ICD-10-CM | POA: Diagnosis not present

## 2022-09-25 DIAGNOSIS — Z885 Allergy status to narcotic agent status: Secondary | ICD-10-CM | POA: Diagnosis not present

## 2022-09-25 DIAGNOSIS — R42 Dizziness and giddiness: Secondary | ICD-10-CM | POA: Diagnosis not present

## 2022-09-25 DIAGNOSIS — R531 Weakness: Secondary | ICD-10-CM | POA: Diagnosis not present

## 2022-09-26 DIAGNOSIS — R5383 Other fatigue: Secondary | ICD-10-CM | POA: Diagnosis not present

## 2022-09-26 DIAGNOSIS — R252 Cramp and spasm: Secondary | ICD-10-CM | POA: Diagnosis not present

## 2022-09-26 DIAGNOSIS — R600 Localized edema: Secondary | ICD-10-CM | POA: Diagnosis not present

## 2022-09-26 DIAGNOSIS — R001 Bradycardia, unspecified: Secondary | ICD-10-CM | POA: Diagnosis not present

## 2022-09-26 DIAGNOSIS — R1011 Right upper quadrant pain: Secondary | ICD-10-CM | POA: Diagnosis not present

## 2022-09-26 DIAGNOSIS — R110 Nausea: Secondary | ICD-10-CM | POA: Diagnosis not present

## 2022-09-26 DIAGNOSIS — M6281 Muscle weakness (generalized): Secondary | ICD-10-CM | POA: Diagnosis not present

## 2022-09-29 DIAGNOSIS — I10 Essential (primary) hypertension: Secondary | ICD-10-CM | POA: Diagnosis not present

## 2022-09-29 DIAGNOSIS — R9430 Abnormal result of cardiovascular function study, unspecified: Secondary | ICD-10-CM | POA: Diagnosis not present

## 2022-09-29 DIAGNOSIS — R001 Bradycardia, unspecified: Secondary | ICD-10-CM | POA: Diagnosis not present

## 2022-09-29 DIAGNOSIS — T50905A Adverse effect of unspecified drugs, medicaments and biological substances, initial encounter: Secondary | ICD-10-CM | POA: Diagnosis not present

## 2022-10-13 DIAGNOSIS — I209 Angina pectoris, unspecified: Secondary | ICD-10-CM | POA: Diagnosis not present

## 2022-10-13 DIAGNOSIS — I10 Essential (primary) hypertension: Secondary | ICD-10-CM | POA: Diagnosis not present

## 2022-10-13 DIAGNOSIS — R9430 Abnormal result of cardiovascular function study, unspecified: Secondary | ICD-10-CM | POA: Diagnosis not present

## 2022-10-13 DIAGNOSIS — R030 Elevated blood-pressure reading, without diagnosis of hypertension: Secondary | ICD-10-CM | POA: Diagnosis not present

## 2022-10-15 ENCOUNTER — Other Ambulatory Visit (HOSPITAL_COMMUNITY): Payer: Self-pay | Admitting: Internal Medicine

## 2022-10-15 DIAGNOSIS — R9430 Abnormal result of cardiovascular function study, unspecified: Secondary | ICD-10-CM

## 2022-10-16 DIAGNOSIS — M539 Dorsopathy, unspecified: Secondary | ICD-10-CM | POA: Diagnosis not present

## 2022-10-16 DIAGNOSIS — Z79899 Other long term (current) drug therapy: Secondary | ICD-10-CM | POA: Diagnosis not present

## 2022-10-16 DIAGNOSIS — I10 Essential (primary) hypertension: Secondary | ICD-10-CM | POA: Diagnosis not present

## 2022-10-16 DIAGNOSIS — M25559 Pain in unspecified hip: Secondary | ICD-10-CM | POA: Diagnosis not present

## 2022-10-16 DIAGNOSIS — M5136 Other intervertebral disc degeneration, lumbar region: Secondary | ICD-10-CM | POA: Diagnosis not present

## 2022-10-17 ENCOUNTER — Telehealth (HOSPITAL_COMMUNITY): Payer: Self-pay | Admitting: *Deleted

## 2022-10-17 NOTE — Telephone Encounter (Signed)
Attempted to call patient regarding upcoming cardiac CT appointment. °Left message on voicemail with name and callback number ° °Roman Dubuc RN Navigator Cardiac Imaging °[facility] °[phone] Office °[phone] Cell ° °

## 2022-10-20 ENCOUNTER — Ambulatory Visit (HOSPITAL_COMMUNITY): Admission: RE | Admit: 2022-10-20 | Payer: Medicare Other | Source: Ambulatory Visit

## 2022-11-06 DIAGNOSIS — Z20822 Contact with and (suspected) exposure to covid-19: Secondary | ICD-10-CM | POA: Diagnosis not present

## 2022-11-06 DIAGNOSIS — J069 Acute upper respiratory infection, unspecified: Secondary | ICD-10-CM | POA: Diagnosis not present

## 2022-11-08 DIAGNOSIS — J45909 Unspecified asthma, uncomplicated: Secondary | ICD-10-CM | POA: Diagnosis not present

## 2022-11-08 DIAGNOSIS — Z885 Allergy status to narcotic agent status: Secondary | ICD-10-CM | POA: Diagnosis not present

## 2022-11-08 DIAGNOSIS — Z79899 Other long term (current) drug therapy: Secondary | ICD-10-CM | POA: Diagnosis not present

## 2022-11-08 DIAGNOSIS — F32A Depression, unspecified: Secondary | ICD-10-CM | POA: Diagnosis not present

## 2022-11-08 DIAGNOSIS — R051 Acute cough: Secondary | ICD-10-CM | POA: Diagnosis not present

## 2022-11-08 DIAGNOSIS — J029 Acute pharyngitis, unspecified: Secondary | ICD-10-CM | POA: Diagnosis not present

## 2022-11-08 DIAGNOSIS — Z888 Allergy status to other drugs, medicaments and biological substances status: Secondary | ICD-10-CM | POA: Diagnosis not present

## 2022-11-08 DIAGNOSIS — M5136 Other intervertebral disc degeneration, lumbar region: Secondary | ICD-10-CM | POA: Diagnosis not present

## 2022-11-08 DIAGNOSIS — J069 Acute upper respiratory infection, unspecified: Secondary | ICD-10-CM | POA: Diagnosis not present

## 2022-11-08 DIAGNOSIS — B9789 Other viral agents as the cause of diseases classified elsewhere: Secondary | ICD-10-CM | POA: Diagnosis not present

## 2022-11-08 DIAGNOSIS — I10 Essential (primary) hypertension: Secondary | ICD-10-CM | POA: Diagnosis not present

## 2022-11-08 DIAGNOSIS — K219 Gastro-esophageal reflux disease without esophagitis: Secondary | ICD-10-CM | POA: Diagnosis not present

## 2022-11-13 DIAGNOSIS — J069 Acute upper respiratory infection, unspecified: Secondary | ICD-10-CM | POA: Diagnosis not present

## 2022-11-18 DIAGNOSIS — M5136 Other intervertebral disc degeneration, lumbar region: Secondary | ICD-10-CM | POA: Diagnosis not present

## 2022-11-18 DIAGNOSIS — I10 Essential (primary) hypertension: Secondary | ICD-10-CM | POA: Diagnosis not present

## 2022-11-18 DIAGNOSIS — M25559 Pain in unspecified hip: Secondary | ICD-10-CM | POA: Diagnosis not present

## 2022-11-18 DIAGNOSIS — Z79899 Other long term (current) drug therapy: Secondary | ICD-10-CM | POA: Diagnosis not present

## 2022-11-18 DIAGNOSIS — M539 Dorsopathy, unspecified: Secondary | ICD-10-CM | POA: Diagnosis not present

## 2022-12-04 ENCOUNTER — Encounter (HOSPITAL_COMMUNITY): Payer: Self-pay

## 2022-12-16 DIAGNOSIS — Z79899 Other long term (current) drug therapy: Secondary | ICD-10-CM | POA: Diagnosis not present

## 2022-12-16 DIAGNOSIS — M5136 Other intervertebral disc degeneration, lumbar region: Secondary | ICD-10-CM | POA: Diagnosis not present

## 2022-12-16 DIAGNOSIS — M539 Dorsopathy, unspecified: Secondary | ICD-10-CM | POA: Diagnosis not present

## 2022-12-16 DIAGNOSIS — M25559 Pain in unspecified hip: Secondary | ICD-10-CM | POA: Diagnosis not present

## 2022-12-16 DIAGNOSIS — I10 Essential (primary) hypertension: Secondary | ICD-10-CM | POA: Diagnosis not present

## 2023-01-12 DIAGNOSIS — Z79899 Other long term (current) drug therapy: Secondary | ICD-10-CM | POA: Diagnosis not present

## 2023-01-12 DIAGNOSIS — Z885 Allergy status to narcotic agent status: Secondary | ICD-10-CM | POA: Diagnosis not present

## 2023-01-12 DIAGNOSIS — K219 Gastro-esophageal reflux disease without esophagitis: Secondary | ICD-10-CM | POA: Diagnosis not present

## 2023-01-12 DIAGNOSIS — F32A Depression, unspecified: Secondary | ICD-10-CM | POA: Diagnosis not present

## 2023-01-12 DIAGNOSIS — I2510 Atherosclerotic heart disease of native coronary artery without angina pectoris: Secondary | ICD-10-CM | POA: Diagnosis not present

## 2023-01-12 DIAGNOSIS — I10 Essential (primary) hypertension: Secondary | ICD-10-CM | POA: Diagnosis not present

## 2023-01-12 DIAGNOSIS — R072 Precordial pain: Secondary | ICD-10-CM | POA: Diagnosis not present

## 2023-01-12 DIAGNOSIS — R079 Chest pain, unspecified: Secondary | ICD-10-CM | POA: Diagnosis not present

## 2023-01-12 DIAGNOSIS — R0602 Shortness of breath: Secondary | ICD-10-CM | POA: Diagnosis not present

## 2023-01-12 DIAGNOSIS — I160 Hypertensive urgency: Secondary | ICD-10-CM | POA: Diagnosis not present

## 2023-01-12 DIAGNOSIS — R0789 Other chest pain: Secondary | ICD-10-CM | POA: Diagnosis not present

## 2023-01-12 DIAGNOSIS — Z888 Allergy status to other drugs, medicaments and biological substances status: Secondary | ICD-10-CM | POA: Diagnosis not present

## 2023-01-16 DIAGNOSIS — M5136 Other intervertebral disc degeneration, lumbar region: Secondary | ICD-10-CM | POA: Diagnosis not present

## 2023-01-16 DIAGNOSIS — M539 Dorsopathy, unspecified: Secondary | ICD-10-CM | POA: Diagnosis not present

## 2023-01-16 DIAGNOSIS — Z79899 Other long term (current) drug therapy: Secondary | ICD-10-CM | POA: Diagnosis not present

## 2023-01-16 DIAGNOSIS — M25559 Pain in unspecified hip: Secondary | ICD-10-CM | POA: Diagnosis not present

## 2023-01-16 DIAGNOSIS — I10 Essential (primary) hypertension: Secondary | ICD-10-CM | POA: Diagnosis not present

## 2023-01-22 DIAGNOSIS — M6281 Muscle weakness (generalized): Secondary | ICD-10-CM | POA: Insufficient documentation

## 2023-01-22 DIAGNOSIS — R0789 Other chest pain: Secondary | ICD-10-CM | POA: Diagnosis not present

## 2023-01-22 DIAGNOSIS — R1011 Right upper quadrant pain: Secondary | ICD-10-CM | POA: Insufficient documentation

## 2023-01-22 DIAGNOSIS — R001 Bradycardia, unspecified: Secondary | ICD-10-CM | POA: Insufficient documentation

## 2023-01-22 DIAGNOSIS — I10 Essential (primary) hypertension: Secondary | ICD-10-CM | POA: Diagnosis not present

## 2023-01-22 DIAGNOSIS — K219 Gastro-esophageal reflux disease without esophagitis: Secondary | ICD-10-CM | POA: Insufficient documentation

## 2023-01-22 DIAGNOSIS — I1310 Hypertensive heart and chronic kidney disease without heart failure, with stage 1 through stage 4 chronic kidney disease, or unspecified chronic kidney disease: Secondary | ICD-10-CM | POA: Diagnosis not present

## 2023-01-22 DIAGNOSIS — E539 Vitamin B deficiency, unspecified: Secondary | ICD-10-CM | POA: Insufficient documentation

## 2023-01-22 DIAGNOSIS — I2510 Atherosclerotic heart disease of native coronary artery without angina pectoris: Secondary | ICD-10-CM | POA: Diagnosis not present

## 2023-01-22 DIAGNOSIS — R011 Cardiac murmur, unspecified: Secondary | ICD-10-CM | POA: Diagnosis not present

## 2023-01-22 DIAGNOSIS — D509 Iron deficiency anemia, unspecified: Secondary | ICD-10-CM | POA: Diagnosis not present

## 2023-01-22 DIAGNOSIS — R5383 Other fatigue: Secondary | ICD-10-CM | POA: Insufficient documentation

## 2023-01-22 DIAGNOSIS — F411 Generalized anxiety disorder: Secondary | ICD-10-CM | POA: Insufficient documentation

## 2023-01-22 DIAGNOSIS — N1831 Chronic kidney disease, stage 3a: Secondary | ICD-10-CM | POA: Diagnosis not present

## 2023-01-22 DIAGNOSIS — I6529 Occlusion and stenosis of unspecified carotid artery: Secondary | ICD-10-CM | POA: Diagnosis not present

## 2023-01-22 DIAGNOSIS — R600 Localized edema: Secondary | ICD-10-CM | POA: Diagnosis not present

## 2023-01-22 DIAGNOSIS — R110 Nausea: Secondary | ICD-10-CM | POA: Insufficient documentation

## 2023-02-03 ENCOUNTER — Encounter: Payer: Self-pay | Admitting: Nurse Practitioner

## 2023-02-03 ENCOUNTER — Ambulatory Visit: Payer: 59 | Attending: Nurse Practitioner | Admitting: Nurse Practitioner

## 2023-02-03 ENCOUNTER — Ambulatory Visit: Payer: 59 | Attending: Nurse Practitioner

## 2023-02-03 ENCOUNTER — Telehealth: Payer: Self-pay | Admitting: Nurse Practitioner

## 2023-02-03 ENCOUNTER — Other Ambulatory Visit: Payer: Self-pay | Admitting: Nurse Practitioner

## 2023-02-03 VITALS — BP 110/70 | HR 57 | Ht 65.0 in | Wt 206.0 lb

## 2023-02-03 DIAGNOSIS — I10 Essential (primary) hypertension: Secondary | ICD-10-CM

## 2023-02-03 DIAGNOSIS — I25119 Atherosclerotic heart disease of native coronary artery with unspecified angina pectoris: Secondary | ICD-10-CM

## 2023-02-03 DIAGNOSIS — R0989 Other specified symptoms and signs involving the circulatory and respiratory systems: Secondary | ICD-10-CM

## 2023-02-03 DIAGNOSIS — I6523 Occlusion and stenosis of bilateral carotid arteries: Secondary | ICD-10-CM | POA: Diagnosis not present

## 2023-02-03 DIAGNOSIS — R079 Chest pain, unspecified: Secondary | ICD-10-CM | POA: Diagnosis not present

## 2023-02-03 DIAGNOSIS — F439 Reaction to severe stress, unspecified: Secondary | ICD-10-CM

## 2023-02-03 DIAGNOSIS — R002 Palpitations: Secondary | ICD-10-CM | POA: Diagnosis not present

## 2023-02-03 DIAGNOSIS — R600 Localized edema: Secondary | ICD-10-CM | POA: Diagnosis not present

## 2023-02-03 DIAGNOSIS — R0609 Other forms of dyspnea: Secondary | ICD-10-CM | POA: Diagnosis not present

## 2023-02-03 MED ORDER — ASPIRIN 81 MG PO TBEC: 81.0000 mg | DELAYED_RELEASE_TABLET | Freq: Every day | ORAL | 3 refills | Status: DC

## 2023-02-03 NOTE — Patient Instructions (Addendum)
Medication Instructions:  Your physician has recommended you make the following change in your medication:  START taking aspirin 81 mg once a day Continue all other medications as directed  Labwork: none  Testing/Procedures: ZIO- Long Term Monitor Instructions   Your physician has requested you wear your ZIO patch monitor 7 days.   This is a single patch monitor.  [employer] supplies one patch monitor per enrollment.  Additional stickers are not available.   Please do not apply patch if you will be having a Nuclear Stress Test, Echocardiogram, Cardiac CT, MRI, or Chest Xray during the time frame you would be wearing the monitor. The patch cannot be worn during these tests.  You cannot remove and re-apply the ZIO XT patch monitor.    While looking in a mirror, press and release button in center of patch.  A small green light will flash 3-4 times .  This will be your only indicator the monitor has been turned on.     Do not shower for the first 24 hours.  You may shower after the first 24 hours.   Press button if you feel a symptom. You will hear a small click.  Record Date, Time and Symptom in the Patient Log Book.   When you are ready to remove patch, follow instructions on last 2 pages of Patient Log Book.  Stick patch monitor onto last page of Patient Log Book.   Place Patient Log Book in [place] box.  Use locking tab on box and tape box closed securely.  The [employer] has [employer] on it.  Please place in mailbox as soon as possible.  Your physician should have your test results approximately 7 days after the monitor has been mailed back to [facility].   Call [place] at [phone] if you have questions regarding your ZIO XT patch monitor.  Call them immediately if you see an orange light blinking on your monitor.   If your monitor falls off in less than 4 days contact our Monitor department at [phone].  If your monitor becomes loose or falls  off after 4 days call [employer] at [phone] for suggestions on securing your monitor. ____________________________________________________________________________________________________ Your physician has requested that you have an echocardiogram. Echocardiography is a painless test that uses sound waves to create images of your heart. It provides your doctor with information about the size and shape of your heart and how well your heart's chambers and valves are working. This procedure takes approximately one hour. There are no restrictions for this procedure. Please do NOT wear cologne, perfume, aftershave, or lotions (deodorant is allowed). Please arrive 15 minutes prior to your appointment time.  ____________________________________________________________________________________________________ Carotid Doppler ____________________________________________________________________________________________________ Your physician has requested that you have a lexiscan myoview. For further information please visit [email or web address]. Please follow instruction sheet, as given.    Follow-Up:  Your physician recommends that you schedule a follow-up appointment in: 6-8 weeks  Any Other Special Instructions Will Be Listed Below (If Applicable).  If you need a refill on your cardiac medications before your next appointment, please call your pharmacy.
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
## 2023-02-03 NOTE — Telephone Encounter (Signed)
Checking percert on the following patient for testing scheduled at [facility].   7 day ZIO XT dx: palpitations  Lexi dx: chest pains   02-12-2023

## 2023-02-10 ENCOUNTER — Ambulatory Visit: Payer: Medicare Other | Admitting: Nurse Practitioner

## 2023-02-11 ENCOUNTER — Telehealth: Payer: Self-pay | Admitting: Cardiology

## 2023-02-11 ENCOUNTER — Encounter (HOSPITAL_COMMUNITY): Payer: 59

## 2023-02-11 NOTE — Telephone Encounter (Signed)
Pt c/o of Chest Pain: STAT if CP now or developed within 24 hours  1. Are you having CP right now? Yes   2. Are you experiencing any other symptoms (ex. SOB, nausea, vomiting, sweating)? nausea  3. How long have you been experiencing CP? This morning  4. Is your CP continuous or coming and going? continuous  5. Have you taken Nitroglycerin? Yes  ?
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
## 2023-02-12 ENCOUNTER — Telehealth: Payer: Self-pay | Admitting: Student

## 2023-02-12 ENCOUNTER — Ambulatory Visit (HOSPITAL_BASED_OUTPATIENT_CLINIC_OR_DEPARTMENT_OTHER)
Admission: RE | Admit: 2023-02-12 | Discharge: 2023-02-12 | Disposition: A | Discharge status: Home / Self Care | Payer: 59 | Source: Ambulatory Visit | Attending: Nurse Practitioner | Admitting: Nurse Practitioner

## 2023-02-12 ENCOUNTER — Ambulatory Visit (HOSPITAL_COMMUNITY)
Admission: RE | Admit: 2023-02-12 | Discharge: 2023-02-12 | Disposition: A | Discharge status: Home / Self Care | Payer: 59 | Source: Ambulatory Visit | Attending: Nurse Practitioner | Admitting: Nurse Practitioner

## 2023-02-12 DIAGNOSIS — R079 Chest pain, unspecified: Secondary | ICD-10-CM | POA: Insufficient documentation

## 2023-02-12 LAB — NM MYOCAR MULTI W/SPECT W/WALL MOTION / EF
Base ST Depression (mm): 0 mm
LV dias vol: 101 mL (ref 46–106)
LV sys vol: 37 mL
Nuc Stress EF: 63 %
Peak HR: 67 {beats}/min
RATE: 0.4
Rest HR: 46 {beats}/min
Rest Nuclear Isotope Dose: 11 mCi
SDS: 1
SRS: 0
SSS: 1
ST Depression (mm): 0 mm
Stress Nuclear Isotope Dose: 31.6 mCi
TID: 1.24

## 2023-02-12 MED ORDER — SODIUM CHLORIDE FLUSH 0.9 % IV SOLN
INTRAVENOUS | Status: AC
  Administered 2023-02-12: 10 mL via INTRAVENOUS
  Filled 2023-02-12: qty 10

## 2023-02-12 MED ORDER — REGADENOSON 0.4 MG/5ML IV SOLN
INTRAVENOUS | Status: AC
  Administered 2023-02-12: 0.4 mg via INTRAVENOUS
  Filled 2023-02-12: qty 5

## 2023-02-12 MED ORDER — TECHNETIUM TC 99M TETROFOSMIN IV KIT
30.0000 | PACK | Freq: Once | INTRAVENOUS | Status: AC | PRN
  Administered 2023-02-12: 31.6 via INTRAVENOUS

## 2023-02-12 MED ORDER — TECHNETIUM TC 99M TETROFOSMIN IV KIT
10.0000 | PACK | Freq: Once | INTRAVENOUS | Status: AC | PRN
  Administered 2023-02-12: 11 via INTRAVENOUS

## 2023-02-12 NOTE — Telephone Encounter (Signed)
   Patient presented for her stress test and noted to be bradycardiac with HR in the 30's to 40's. No evidence of high-grade AV block. 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 She does report intermittent dizziness at home but was asymptomatic today. Will forward this to the ordering provider as she will likely need med adjustments. She is listed as being on Atenolol 50mg  daily and Coreg 6.25mg  BID. She does not believe she takes Coreg but would need to verify with her pharmacy. If only on Atenolol, she will likely need a lower dose.   Signed, Erma Heritage, PA-C 02/12/2023, 12:00 PM

## 2023-02-13 DIAGNOSIS — M25559 Pain in unspecified hip: Secondary | ICD-10-CM | POA: Diagnosis not present

## 2023-02-13 DIAGNOSIS — Z79899 Other long term (current) drug therapy: Secondary | ICD-10-CM | POA: Diagnosis not present

## 2023-02-13 DIAGNOSIS — I10 Essential (primary) hypertension: Secondary | ICD-10-CM | POA: Diagnosis not present

## 2023-02-13 DIAGNOSIS — M539 Dorsopathy, unspecified: Secondary | ICD-10-CM | POA: Diagnosis not present

## 2023-02-13 DIAGNOSIS — M5136 Other intervertebral disc degeneration, lumbar region: Secondary | ICD-10-CM | POA: Diagnosis not present

## 2023-02-13 NOTE — Telephone Encounter (Signed)
Left message for patient to call office.  

## 2023-02-16 DIAGNOSIS — R002 Palpitations: Secondary | ICD-10-CM | POA: Diagnosis not present

## 2023-02-16 NOTE — Telephone Encounter (Signed)
Patient is returning call. Transferred to [place], CMA.  

## 2023-02-16 NOTE — Telephone Encounter (Signed)
2nd attempt lmtcb

## 2023-02-17 NOTE — Telephone Encounter (Signed)
Patient states that she is unsure whether or not she is taking coreg. Reached out to [facility] and requested a copy of medication record for clarification.

## 2023-02-17 NOTE — Telephone Encounter (Signed)
Received a copy of the medication record and it is very hard to read. Spoke with Selena Batten at [facility] who states that patient is not taking currently taking coreg.

## 2023-02-18 DIAGNOSIS — D509 Iron deficiency anemia, unspecified: Secondary | ICD-10-CM | POA: Diagnosis not present

## 2023-02-18 DIAGNOSIS — I10 Essential (primary) hypertension: Secondary | ICD-10-CM | POA: Diagnosis not present

## 2023-02-18 DIAGNOSIS — E539 Vitamin B deficiency, unspecified: Secondary | ICD-10-CM | POA: Diagnosis not present

## 2023-02-18 MED ORDER — ATENOLOL 50 MG PO TABS: 25.0000 mg | ORAL_TABLET | Freq: Every day | ORAL | Status: DC

## 2023-02-18 NOTE — Addendum Note (Signed)
Addended by: Delsa Sale on: 02/18/2023 06:56 AM   Modules accepted: Orders

## 2023-02-18 NOTE — Telephone Encounter (Signed)
Changes have been to medication list.

## 2023-02-19 ENCOUNTER — Telehealth: Payer: Self-pay | Admitting: *Deleted

## 2023-02-19 ENCOUNTER — Telehealth: Payer: Self-pay | Admitting: Licensed Clinical Social Worker

## 2023-02-19 DIAGNOSIS — I10 Essential (primary) hypertension: Secondary | ICD-10-CM

## 2023-02-19 NOTE — Telephone Encounter (Signed)
CSW received referral to assist with providing caregiver stress resources. CSW attempted to reach patient with no answer and message left for return call. CSW will refer to [facility] Care Management for additional support as well. Lasandra Beech, LCSW, CCSW-MCS [phone]

## 2023-02-19 NOTE — Progress Notes (Signed)
  Care Coordination   Note   02/19/2023 Name: [name] MRN: [id number] DOB: 1956/10/15  [name] is a 67 y.o. year old female who sees Margo Aye, Kathleene Hazel, MD for primary care. I reached out to [facility] by phone today to offer care coordination services.  Ms. Attig was given information about Care Coordination services today including:   The Care Coordination services include support from the care team which includes your Nurse Coordinator, Clinical Social Worker, or Pharmacist.  The Care Coordination team is here to help remove barriers to the health concerns and goals most important to you. Care Coordination services are voluntary, and the patient may decline or stop services at any time by request to their care team member.   Care Coordination Consent Status: Patient agreed to services and verbal consent obtained.   Follow up plan:  Telephone appointment with care coordination team member scheduled for:  02/23/23  Encounter Outcome:  Pt. Scheduled  [facility] Coordination Care Guide  Direct Dial: [phone]

## 2023-02-23 ENCOUNTER — Ambulatory Visit: Payer: Self-pay | Admitting: Licensed Clinical Social Worker

## 2023-02-23 NOTE — Patient Outreach (Signed)
  Care Coordination   02/23/2023 Name: [name] MRN: [id number] DOB: 1956/01/17   Care Coordination Outreach Attempts:  An unsuccessful telephone outreach was attempted today to offer the patient information about available care coordination services as a benefit of their health plan.   Follow Up Plan:  Additional outreach attempts will be made to offer the patient care coordination information and services.   Encounter Outcome:  No Answer   Care Coordination Interventions:  No, not indicated    Kelton Pillar.Jeani Fassnacht MSW, LCSW Licensed Visual merchandiser [facility] Care Management [phone]

## 2023-02-24 DIAGNOSIS — R0789 Other chest pain: Secondary | ICD-10-CM | POA: Diagnosis not present

## 2023-02-24 DIAGNOSIS — E669 Obesity, unspecified: Secondary | ICD-10-CM | POA: Insufficient documentation

## 2023-02-24 DIAGNOSIS — I10 Essential (primary) hypertension: Secondary | ICD-10-CM | POA: Diagnosis not present

## 2023-02-24 DIAGNOSIS — D509 Iron deficiency anemia, unspecified: Secondary | ICD-10-CM | POA: Diagnosis not present

## 2023-02-24 DIAGNOSIS — E539 Vitamin B deficiency, unspecified: Secondary | ICD-10-CM | POA: Diagnosis not present

## 2023-02-24 DIAGNOSIS — I6529 Occlusion and stenosis of unspecified carotid artery: Secondary | ICD-10-CM | POA: Diagnosis not present

## 2023-02-24 DIAGNOSIS — R011 Cardiac murmur, unspecified: Secondary | ICD-10-CM | POA: Diagnosis not present

## 2023-02-24 DIAGNOSIS — R600 Localized edema: Secondary | ICD-10-CM | POA: Diagnosis not present

## 2023-02-24 DIAGNOSIS — K219 Gastro-esophageal reflux disease without esophagitis: Secondary | ICD-10-CM | POA: Diagnosis not present

## 2023-02-24 DIAGNOSIS — N1831 Chronic kidney disease, stage 3a: Secondary | ICD-10-CM | POA: Diagnosis not present

## 2023-02-24 DIAGNOSIS — I129 Hypertensive chronic kidney disease with stage 1 through stage 4 chronic kidney disease, or unspecified chronic kidney disease: Secondary | ICD-10-CM | POA: Diagnosis not present

## 2023-02-26 ENCOUNTER — Ambulatory Visit: Payer: 59 | Attending: Nurse Practitioner

## 2023-02-26 ENCOUNTER — Ambulatory Visit: Payer: 59

## 2023-02-26 DIAGNOSIS — R0609 Other forms of dyspnea: Secondary | ICD-10-CM

## 2023-02-26 DIAGNOSIS — R0989 Other specified symptoms and signs involving the circulatory and respiratory systems: Secondary | ICD-10-CM

## 2023-02-27 LAB — ECHOCARDIOGRAM COMPLETE
AR max vel: 112.5 cm2
AV Area VTI: 0.14 cm2
AV Area mean vel: 113.49 cm2
AV Mean grad: 13 mmHg
AV Peak grad: 21.2 mmHg
Ao pk vel: 2.3 m/s
Area-P 1/2: 2.22 cm2
MV M vel: 474 m/s
MV Peak grad: 898704 mmHg
S' Lateral: 2.7 cm

## 2023-02-27 LAB — VAS US CAROTID: Single Plane A4C EF: 65.9 %

## 2023-03-02 ENCOUNTER — Telehealth: Payer: Self-pay | Admitting: *Deleted

## 2023-03-02 NOTE — Progress Notes (Signed)
  Care Coordination Note  03/02/2023 Name: [name] MRN: [id number] DOB: Nov 02, 1956  [name] is a 67 y.o. year old female who is a primary care patient of Margo Aye, Kathleene Hazel, MD and is actively engaged with the care management team. I reached out to [facility] by phone today to assist with re-scheduling an initial visit with the Licensed Clinical Social Worker  Follow up plan: Unsuccessful telephone outreach attempt made. A HIPAA compliant phone message was left for the patient providing contact information and requesting a return call.   [facility]  Care Coordination Care Guide  Direct Dial: [phone]

## 2023-03-04 ENCOUNTER — Telehealth: Payer: Self-pay | Admitting: Cardiology

## 2023-03-04 NOTE — Telephone Encounter (Signed)
Patient called to follow-up on test results. 

## 2023-03-05 NOTE — Progress Notes (Signed)
  Care Coordination Note  03/05/2023 Name: MAKYRA CORPREW MRN: [id number] DOB: 13-Nov-1955  [name] is a 67 y.o. year old female who is a primary care patient of Margo Aye, Kathleene Hazel, MD and is actively engaged with the care management team. I reached out to [facility] by phone today to assist with re-scheduling an initial visit with the Licensed Clinical Social Worker  Follow up plan: Telephone appointment with care management team member scheduled for:03/10/23  [facility]  Care Coordination Care Guide  Direct Dial: [phone]

## 2023-03-06 NOTE — Telephone Encounter (Signed)
Patient called again to follow-up on test results.

## 2023-03-06 NOTE — Telephone Encounter (Signed)
Patient made aware of results, verbalized understanding.

## 2023-03-10 ENCOUNTER — Ambulatory Visit: Payer: Self-pay | Admitting: Licensed Clinical Social Worker

## 2023-03-10 NOTE — Patient Instructions (Signed)
Visit Information  Thank you for taking time to visit with me today. Please don't hesitate to contact me if I can be of assistance to you.   Following are the goals we discussed today:   Goals Addressed             This Visit's Progress    Patient has some stress in managing medical needs       Interventions: LCSW spoke today via phone with client about program support LCSW discussed program support for client with RN, LCSW and Pharmacist Discussed client support with PCP, Dr. Fidel Levy Encouraged client to call LCSW at [phone] to learn more about program support        Our next appointment is by telephone on 03/17/23 at 10:30 AM   Please call the care guide team at [phone] if you need to cancel or reschedule your appointment.   If you are experiencing a Mental Health or Behavioral Health Crisis or need someone to talk to, please go to [facility] [address] [place] [phone])   The patient verbalized understanding of instructions, educational materials, and care plan provided today and DECLINED offer to receive copy of patient instructions, educational materials, and care plan.   The patient has been provided with contact information for the care management team and has been advised to call with any health related questions or concerns.   Kelton Pillar.Braelin Brosch MSW, LCSW Licensed Visual merchandiser [facility] Care Management [phone]

## 2023-03-10 NOTE — Patient Outreach (Signed)
  Care Coordination   Initial Visit Note   03/10/2023 Name: [name] MRN: [id number] DOB: 14-Feb-1956  [name] is a 67 y.o. year old female who sees [name], Jasmine Hazel, MD for primary care. I spoke with  [name] by phone today.  What matters to the patients health and wellness today?  Patient has some stress in managing medical  needs     Goals Addressed             This Visit's Progress    Patient has some stress in managing medical needs       Interventions: LCSW spoke today via phone with client about program support LCSW discussed program support for client with RN, LCSW and Pharmacist Discussed client support with PCP, Dr. Fidel [name] Encouraged client to call LCSW at [phone] to learn more about program support        SDOH assessments and interventions completed:  Yes  SDOH Interventions Today    Flowsheet Row Most Recent Value  SDOH Interventions   Depression Interventions/Treatment  Counseling, Currently on Treatment  Physical Activity Interventions Other (Comments)  [may have decreased activity]  Stress Interventions Provide Counseling  [stress in managing medical needs]        Care Coordination Interventions:  Yes, provided   Interventions Today    Flowsheet Row Most Recent Value  Chronic Disease   Chronic disease during today's visit Other  [spoke with client about program support]  General Interventions   General Interventions Discussed/Reviewed General Interventions Discussed, [employer] program support for client with RN, LCSW and Pharmacist]  Education Interventions   Education Provided Provided Education  Provided Verbal Education On [employer]  Safety Interventions   Safety Discussed/Reviewed Fall Risk        Follow up plan: Follow up call scheduled for 03/17/23 at 10:30 AM    Encounter Outcome:  Pt. Visit Completed   [name] MSW, LCSW Licensed Visual merchandiser [facility] Care  Management [phone]

## 2023-03-13 DIAGNOSIS — I10 Essential (primary) hypertension: Secondary | ICD-10-CM | POA: Diagnosis not present

## 2023-03-13 DIAGNOSIS — M5136 Other intervertebral disc degeneration, lumbar region: Secondary | ICD-10-CM | POA: Diagnosis not present

## 2023-03-13 DIAGNOSIS — M25559 Pain in unspecified hip: Secondary | ICD-10-CM | POA: Diagnosis not present

## 2023-03-13 DIAGNOSIS — M539 Dorsopathy, unspecified: Secondary | ICD-10-CM | POA: Diagnosis not present

## 2023-03-13 DIAGNOSIS — Z79899 Other long term (current) drug therapy: Secondary | ICD-10-CM | POA: Diagnosis not present

## 2023-03-17 ENCOUNTER — Ambulatory Visit: Payer: Self-pay | Admitting: Licensed Clinical Social Worker

## 2023-03-17 DIAGNOSIS — Z79899 Other long term (current) drug therapy: Secondary | ICD-10-CM | POA: Diagnosis not present

## 2023-03-17 DIAGNOSIS — G8929 Other chronic pain: Secondary | ICD-10-CM | POA: Diagnosis not present

## 2023-03-17 DIAGNOSIS — I10 Essential (primary) hypertension: Secondary | ICD-10-CM | POA: Diagnosis not present

## 2023-03-17 DIAGNOSIS — M25562 Pain in left knee: Secondary | ICD-10-CM | POA: Diagnosis not present

## 2023-03-17 DIAGNOSIS — W19XXXA Unspecified fall, initial encounter: Secondary | ICD-10-CM | POA: Diagnosis not present

## 2023-03-17 DIAGNOSIS — M549 Dorsalgia, unspecified: Secondary | ICD-10-CM | POA: Diagnosis not present

## 2023-03-17 DIAGNOSIS — Z743 Need for continuous supervision: Secondary | ICD-10-CM | POA: Diagnosis not present

## 2023-03-17 DIAGNOSIS — R6889 Other general symptoms and signs: Secondary | ICD-10-CM | POA: Diagnosis not present

## 2023-03-17 DIAGNOSIS — Z043 Encounter for examination and observation following other accident: Secondary | ICD-10-CM | POA: Diagnosis not present

## 2023-03-17 DIAGNOSIS — Z885 Allergy status to narcotic agent status: Secondary | ICD-10-CM | POA: Diagnosis not present

## 2023-03-17 DIAGNOSIS — K219 Gastro-esophageal reflux disease without esophagitis: Secondary | ICD-10-CM | POA: Diagnosis not present

## 2023-03-17 DIAGNOSIS — M25552 Pain in left hip: Secondary | ICD-10-CM | POA: Diagnosis not present

## 2023-03-17 NOTE — Patient Outreach (Signed)
  Care Coordination   Follow Up Visit Note   03/17/2023 Name: [name] MRN: [id number] DOB: Feb 13, 1956  [name] is a 67 y.o. year old female who sees Margo Aye, Kathleene Hazel, MD for primary care. I spoke with  [name] by phone today.  What matters to the patients health and wellness today? Client has financial needs, medical needs, and  family stress issues faced    Goals Addressed             This Visit's Progress    Patient Stated she has financial needs, medical needs and family stress needs       Interventions:  Spoke with client about client needs. She said she has financial needs, medical needs and family issues faced Discussed client care of her spouse.  Discussed work schedule of client. She works 25 hours per week. Discussed coping skills. She draws as a form of relaxation Discussed transport. She drives her car as needed for transport Discussed medication procurement Discussed sleeping issues. She has some sleeping issues Discussed use of [employer] Card from insurance. [employer] Card is very helpful to client Discussed walking of client. Can use cane if needed Discussed family support with son Discussed program support with RN, Pharmacist and LCSW. Discussed Care coordination program with client Discussed her management of stress issues. She has family stress issues. Her brother is ill at present. Client husband has Dementia diagnosis Provided counseling support for client          SDOH assessments and interventions completed:  Yes  SDOH Interventions Today    Flowsheet Row Most Recent Value  SDOH Interventions   Depression Interventions/Treatment  Counseling, Currently on Treatment  Financial Strain Interventions Other (Comment)  [financial stressors]  Physical Activity Interventions Other (Comments)  [client has stress related to managing medical needs]  Stress Interventions Provide Counseling  [client has stress in managing medical needs]        Care  Coordination Interventions:  Yes, provided   Interventions Today    Flowsheet Row Most Recent Value  Chronic Disease   Chronic disease during today's visit Other  [talked with client about her medical needs, financial needs and family needs]  General Interventions   General Interventions Discussed/Reviewed General Interventions Discussed, [employer] program support,  discussed client financial needs]  Education Interventions   Education Provided Provided Education  Provided Verbal Education On [employer] program support for client with RN, Teacher, early years/pre and LCSW]  Mental Health Interventions   Mental Health Discussed/Reviewed Anxiety, Coping Strategies  [spoke of coping skills. she likes to watch TV, draws with pencils]  Nutrition Interventions   Nutrition Discussed/Reviewed Nutrition Discussed  Safety Interventions   Safety Discussed/Reviewed Fall Risk        Follow up plan: Follow up call scheduled for 05/11/23 at 1:00 PM     Encounter Outcome:  Pt. Visit Completed   Kelton Pillar.Dorthy Magnussen MSW, LCSW Licensed Visual merchandiser [facility] Care Management [phone]

## 2023-03-17 NOTE — Patient Instructions (Signed)
Visit Information  Thank you for taking time to visit with me today. Please don't hesitate to contact me if I can be of assistance to you.   Following are the goals we discussed today:   Goals Addressed             This Visit's Progress    Patient Stated she has financial needs, medical needs and family stress needs       Interventions:  Spoke with client about client needs. She said she has financial needs, medical needs and family issues faced Discussed client care of her spouse.  Discussed work schedule of client. She works 25 hours per week. Discussed coping skills. She draws as a form of relaxation Discussed transport. She drives her car as needed for transport Discussed medication procurement Discussed sleeping issues. She has some sleeping issues Discussed use of U Card from insurance. U Card is very helpful to client Discussed walking of client. Can use cane if needed Discussed family support with son Discussed program support with RN, Pharmacist and LCSW. Discussed Care coordination program with client Discussed her management of stress issues. She has family stress issues. Her brother is ill at present. Client husband has Dementia diagnosis Provided counseling support for client          Our next appointment is by telephone on 05/11/23 at 1:00 PM   Please call the care guide team at [phone] if you need to cancel or reschedule your appointment.   If you are experiencing a Mental Health or Behavioral Health Crisis or need someone to talk to, please go to [facility] [address], [place] [phone])   The patient verbalized understanding of instructions, educational materials, and care plan provided today and DECLINED offer to receive copy of patient instructions, educational materials, and care plan.   The patient has been provided with contact information for the care management team and has been advised to call  with any health related questions or concerns.   Kelton Pillar.Treshawn Allen MSW, LCSW Licensed Visual merchandiser [facility] Care Management [phone]

## 2023-03-26 DIAGNOSIS — Z713 Dietary counseling and surveillance: Secondary | ICD-10-CM | POA: Diagnosis not present

## 2023-03-26 DIAGNOSIS — Z7182 Exercise counseling: Secondary | ICD-10-CM | POA: Diagnosis not present

## 2023-03-26 DIAGNOSIS — I10 Essential (primary) hypertension: Secondary | ICD-10-CM | POA: Diagnosis not present

## 2023-03-26 DIAGNOSIS — Z79899 Other long term (current) drug therapy: Secondary | ICD-10-CM | POA: Diagnosis not present

## 2023-03-26 DIAGNOSIS — W19XXXA Unspecified fall, initial encounter: Secondary | ICD-10-CM | POA: Diagnosis not present

## 2023-03-26 DIAGNOSIS — R600 Localized edema: Secondary | ICD-10-CM | POA: Diagnosis not present

## 2023-03-26 DIAGNOSIS — E539 Vitamin B deficiency, unspecified: Secondary | ICD-10-CM | POA: Diagnosis not present

## 2023-03-26 DIAGNOSIS — W19XXXD Unspecified fall, subsequent encounter: Secondary | ICD-10-CM | POA: Diagnosis not present

## 2023-03-26 DIAGNOSIS — D509 Iron deficiency anemia, unspecified: Secondary | ICD-10-CM | POA: Diagnosis not present

## 2023-03-30 ENCOUNTER — Encounter: Payer: Self-pay | Admitting: *Deleted

## 2023-03-31 ENCOUNTER — Ambulatory Visit: Payer: 59 | Admitting: Nurse Practitioner

## 2023-03-31 DIAGNOSIS — M545 Low back pain, unspecified: Secondary | ICD-10-CM | POA: Diagnosis not present

## 2023-04-07 ENCOUNTER — Other Ambulatory Visit: Payer: Self-pay

## 2023-04-09 ENCOUNTER — Telehealth: Payer: Self-pay | Admitting: Pharmacy Technician

## 2023-04-09 NOTE — Telephone Encounter (Signed)
FYI NOTE:  Auth Submission: NO AUTH NEEDED Site of care: Site of care: [facility] Payer: [employer]HC DUAL Medication & CPT/J Code(s) submitted: Feraheme (ferumoxytol) [id number] Route of submission (phone, fax, portal):  Phone # Fax # Auth type: Buy/Bill Units/visits requested: X2 Reference number: [id number] Approval from: 04/09/23 to 08/10/23   Patient will be scheduled as soon as possible

## 2023-04-09 NOTE — Telephone Encounter (Signed)
Hello [name]. I think you sent this to the wrong provider.  I do not take care of this patient, I am not involved in their care, if you want to send to the ordering provider.  Thanks

## 2023-04-09 NOTE — Telephone Encounter (Signed)
Dr. Adela Lank, Thanks.

## 2023-04-10 ENCOUNTER — Encounter: Payer: Self-pay | Admitting: *Deleted

## 2023-04-13 DIAGNOSIS — I10 Essential (primary) hypertension: Secondary | ICD-10-CM | POA: Diagnosis not present

## 2023-04-13 DIAGNOSIS — M539 Dorsopathy, unspecified: Secondary | ICD-10-CM | POA: Diagnosis not present

## 2023-04-13 DIAGNOSIS — Z79899 Other long term (current) drug therapy: Secondary | ICD-10-CM | POA: Diagnosis not present

## 2023-04-13 DIAGNOSIS — M5136 Other intervertebral disc degeneration, lumbar region: Secondary | ICD-10-CM | POA: Diagnosis not present

## 2023-04-13 DIAGNOSIS — M25559 Pain in unspecified hip: Secondary | ICD-10-CM | POA: Diagnosis not present

## 2023-05-08 ENCOUNTER — Encounter (HOSPITAL_COMMUNITY)
Admission: RE | Admit: 2023-05-08 | Discharge: 2023-05-08 | Disposition: A | Discharge status: Home / Self Care | Payer: 59 | Source: Ambulatory Visit | Attending: Internal Medicine | Admitting: Internal Medicine

## 2023-05-08 VITALS — BP 181/75 | HR 62 | Temp 97.7°F | Resp 20

## 2023-05-08 DIAGNOSIS — D509 Iron deficiency anemia, unspecified: Secondary | ICD-10-CM | POA: Diagnosis not present

## 2023-05-08 MED ORDER — ACETAMINOPHEN 325 MG PO TABS
650.0000 mg | ORAL_TABLET | Freq: Once | ORAL | Status: AC
  Administered 2023-05-08: 650 mg via ORAL

## 2023-05-08 MED ORDER — DIPHENHYDRAMINE HCL 25 MG PO CAPS
25.0000 mg | ORAL_CAPSULE | Freq: Once | ORAL | Status: AC
  Administered 2023-05-08: 25 mg via ORAL

## 2023-05-08 MED ORDER — SODIUM CHLORIDE 0.9 % IV SOLN
510.0000 mg | Freq: Once | INTRAVENOUS | Status: AC
  Administered 2023-05-08: 510 mg via INTRAVENOUS
  Filled 2023-05-08: qty 17

## 2023-05-08 NOTE — Progress Notes (Signed)
Diagnosis: Iron Deficiency Anemia  Provider:  Dwana Melena MD  Procedure: IV Infusion  IV Type: Peripheral, IV Location: R Antecubital  Feraheme (Ferumoxytol), Dose: 510 mg  Infusion Start Time: 1454  Infusion Stop Time: 1510  Post Infusion IV Care: Observation period completed and Peripheral IV Discontinued  Discharge: Condition: Good, Destination: Home . AVS Provided  Performed by:  Marin Shutter, RN

## 2023-05-08 NOTE — Addendum Note (Signed)
Encounter addended by: Kandra Nicolas, RN on: 05/08/2023 4:19 PM  Actions taken: Therapy plan modified

## 2023-05-11 ENCOUNTER — Ambulatory Visit: Payer: Self-pay | Admitting: Licensed Clinical Social Worker

## 2023-05-11 DIAGNOSIS — I10 Essential (primary) hypertension: Secondary | ICD-10-CM | POA: Diagnosis not present

## 2023-05-11 DIAGNOSIS — M25559 Pain in unspecified hip: Secondary | ICD-10-CM | POA: Diagnosis not present

## 2023-05-11 DIAGNOSIS — Z79899 Other long term (current) drug therapy: Secondary | ICD-10-CM | POA: Diagnosis not present

## 2023-05-11 DIAGNOSIS — M5136 Other intervertebral disc degeneration, lumbar region: Secondary | ICD-10-CM | POA: Diagnosis not present

## 2023-05-11 DIAGNOSIS — M539 Dorsopathy, unspecified: Secondary | ICD-10-CM | POA: Diagnosis not present

## 2023-05-11 NOTE — Patient Outreach (Signed)
  Care Coordination   05/11/2023 Name: [name] MRN: [id number] DOB: 02/08/1956   Care Coordination Outreach Attempts:  An unsuccessful telephone outreach was attempted today to offer the patient information about available care coordination services.  Follow Up Plan:  Additional outreach attempts will be made to offer the patient care coordination information and services.   Encounter Outcome:  No Answer   Care Coordination Interventions:  No, not indicated    Kelton Pillar.Doneshia Hill MSW, LCSW Licensed Visual merchandiser [facility] Care Management [phone]

## 2023-05-19 ENCOUNTER — Encounter (HOSPITAL_COMMUNITY)
Admission: RE | Admit: 2023-05-19 | Discharge: 2023-05-19 | Disposition: A | Discharge status: Home / Self Care | Payer: 59 | Source: Ambulatory Visit | Attending: Internal Medicine | Admitting: Internal Medicine

## 2023-05-19 VITALS — BP 172/67 | HR 54 | Temp 97.7°F | Resp 20

## 2023-05-19 DIAGNOSIS — D509 Iron deficiency anemia, unspecified: Secondary | ICD-10-CM | POA: Insufficient documentation

## 2023-05-19 MED ORDER — DIPHENHYDRAMINE HCL 25 MG PO CAPS
25.0000 mg | ORAL_CAPSULE | Freq: Once | ORAL | Status: AC
  Administered 2023-05-19: 25 mg via ORAL

## 2023-05-19 MED ORDER — ACETAMINOPHEN 325 MG PO TABS
650.0000 mg | ORAL_TABLET | Freq: Once | ORAL | Status: AC
  Administered 2023-05-19: 650 mg via ORAL

## 2023-05-19 MED ORDER — SODIUM CHLORIDE 0.9 % IV SOLN
510.0000 mg | Freq: Once | INTRAVENOUS | Status: AC
  Administered 2023-05-19: 510 mg via INTRAVENOUS
  Filled 2023-05-19: qty 510

## 2023-05-19 MED ORDER — ACETAMINOPHEN 325 MG PO TABS
ORAL_TABLET | ORAL | Status: AC
  Filled 2023-05-19: qty 2

## 2023-05-19 NOTE — Progress Notes (Signed)
Diagnosis: Iron Deficiency Anemia  Provider:  Dwana Melena MD  Procedure: IV Infusion  IV Type: Peripheral, IV Location: R Antecubital  Feraheme (Ferumoxytol), Dose: 510 mg  Infusion Start Time: 1342  Infusion Stop Time: 1358  Post Infusion IV Care: Observation period completed  Discharge: Condition: Good, Destination: Home . AVS Provided  Performed by:  Daleen Squibb, RN

## 2023-05-22 ENCOUNTER — Ambulatory Visit: Payer: Self-pay | Admitting: *Deleted

## 2023-05-22 NOTE — Patient Outreach (Signed)
  Care Coordination   05/22/2023 Name: [name] MRN: [id number] DOB: 15-Oct-1956   Care Coordination Outreach Attempts:  An unsuccessful telephone outreach was attempted for a scheduled appointment today.  Follow Up Plan:  Additional outreach attempts will be made to offer the patient care coordination information and services.   Encounter Outcome:  No Answer   Care Coordination Interventions:  No, not indicated    Becca Bayne L. Noelle Penner, RN, BSN, CCM [facility] Care Management Community Coordinator Office number [phone]

## 2023-05-25 ENCOUNTER — Ambulatory Visit: Payer: Self-pay | Admitting: Licensed Clinical Social Worker

## 2023-05-25 NOTE — Patient Instructions (Signed)
Visit Information  Thank you for taking time to visit with me today. Please don't hesitate to contact me if I can be of assistance to you.   Following are the goals we discussed today:   Goals Addressed             This Visit's Progress    Patient Stated she has financial needs, medical needs and family stress needs       Interventions:  Spoke with client via phone today about client needs and status. She said she has financial needs and needs to work each week to earn income. She spoke of stress in caring for the needs of her spouse. She also spoke of family stress issues She said her son resides with her. She said that her son takes care of her spouse while client works. . She said she works about 25 hours per week.  Discussed medication procurement Provided counseling support for client. Discussed family stress issues. Previously talked with Earley Abide about relaxation techniques. She said previously she likes to draw as a way to relax. Discussed transport. She drives her car as needed for transport Discussed sleeping issues She said she has been having some difficulty in sleeping Discussed family support with son Discussed program support with RN, Pharmacist and LCSW. Encouraged Earley Abide to call RN Marval Regal as needed for nursing support. Spoke with Aarti about various types of SW support Encouraged client to call LCSW as needed for SW support at [phone].          Our next appointment is by telephone on 07/06/23 at 9:00 AM   Please call the care guide team at [phone] if you need to cancel or reschedule your appointment.   If you are experiencing a Mental Health or Behavioral Health Crisis or need someone to talk to, please go to [facility] [address], [place] [phone])   The patient verbalized understanding of instructions, educational materials, and care plan provided today and DECLINED offer to receive copy of patient  instructions, educational materials, and care plan.   The patient has been provided with contact information for the care management team and has been advised to call with any health related questions or concerns.   Kelton Pillar.Lenell Mcconnell MSW, LCSW Licensed Visual merchandiser [facility] Care Management [phone]

## 2023-05-25 NOTE — Patient Outreach (Signed)
  Care Coordination   Follow Up Visit Note   05/25/2023 Name: [name] MRN: [id number] DOB: [birth year]  [name] is a 66 y.o. year old female who sees Margo Aye, Kathleene Hazel, MD for primary care. I spoke with  Verdis Prime Cauthorn by phone today.  What matters to the patients health and wellness today?  Patient has financial needs, medical needs and family stress needs    Goals Addressed             This Visit's Progress    Patient Stated she has financial needs, medical needs and family stress needs       Interventions:  Spoke with client via phone today about client needs and status. She said she has financial needs and needs to work each week to earn income. She spoke of stress in caring for the needs of her spouse. She also spoke of family stress issues She said her son resides with her. She said that her son takes care of her spouse while client works. . She said she works about 25 hours per week.  Discussed medication procurement Provided counseling support for client. Discussed family stress issues. Previously talked with Earley Abide about relaxation techniques. She said previously she likes to draw as a way to relax. Discussed transport. She drives her car as needed for transport Discussed sleeping issues She said she has been having some difficulty in sleeping Discussed family support with son Discussed program support with RN, Pharmacist and LCSW. Encouraged Earley Abide to call RN Marval Regal as needed for nursing support. Spoke with Varvara about various types of SW support Encouraged client to call LCSW as needed for SW support at [phone].          SDOH assessments and interventions completed:  Yes  SDOH Interventions Today    Flowsheet Row Most Recent Value  SDOH Interventions   Depression Interventions/Treatment  Counseling  Physical Activity Interventions Other (Comments)  [may have some slight mobility challenges]  Stress Interventions Provide Counseling  [stress related to  managing care needs of her spouse. Financial stressors]        Care Coordination Interventions:  Yes, provided   Interventions Today    Flowsheet Row Most Recent Value  Chronic Disease   Chronic disease during today's visit Other  [spoke with client about client needs]  General Interventions   General Interventions Discussed/Reviewed General Interventions Discussed, [employer]  [discussed program support for client with RN, Pharmacist and LCSW]  Exercise Interventions   Exercise Discussed/Reviewed Physical Activity  Education Interventions   Education Provided Provided Education  Provided Verbal Education On [employer]  Mental Health Interventions   Mental Health Discussed/Reviewed Coping Strategies, Anxiety  [discussed family stress issues. discussed her caring for the needs of her spouse. Discussed her difficulty sleeping. Discussed financial stress]  Nutrition Interventions   Nutrition Discussed/Reviewed Nutrition Discussed  Pharmacy Interventions   Pharmacy Dicussed/Reviewed Pharmacy Topics Discussed  Safety Interventions   Safety Discussed/Reviewed Fall Risk        Follow up plan: Follow up call scheduled for 07/06/23 at 9:00 AM    Encounter Outcome:  Pt. Visit Completed   Kelton Pillar.Genever Hentges MSW, LCSW Licensed Visual merchandiser [facility] Care Management [phone]

## 2023-05-27 ENCOUNTER — Other Ambulatory Visit: Payer: Self-pay

## 2023-06-04 ENCOUNTER — Telehealth: Payer: Self-pay | Admitting: *Deleted

## 2023-06-04 NOTE — Progress Notes (Signed)
  Care Coordination Note  06/04/2023 Name: [name] MRN: [id number] DOB: Jan 24, 1956  [name] is a 67 y.o. year old female who is a primary care patient of Margo Aye, Kathleene Hazel, MD and is actively engaged with the care management team. I reached out to [facility] by phone today to assist with re-scheduling an initial visit with the RN Case Manager  Follow up plan: Unsuccessful telephone outreach attempt made. A HIPAA compliant phone message was left for the patient providing contact information and requesting a return call.   [facility]  Care Coordination Care Guide  Direct Dial: [phone]

## 2023-06-05 ENCOUNTER — Encounter (INDEPENDENT_AMBULATORY_CARE_PROVIDER_SITE_OTHER): Payer: Self-pay | Admitting: *Deleted

## 2023-06-08 DIAGNOSIS — M25559 Pain in unspecified hip: Secondary | ICD-10-CM | POA: Diagnosis not present

## 2023-06-08 DIAGNOSIS — I10 Essential (primary) hypertension: Secondary | ICD-10-CM | POA: Diagnosis not present

## 2023-06-08 DIAGNOSIS — Z79899 Other long term (current) drug therapy: Secondary | ICD-10-CM | POA: Diagnosis not present

## 2023-06-08 DIAGNOSIS — M5136 Other intervertebral disc degeneration, lumbar region: Secondary | ICD-10-CM | POA: Diagnosis not present

## 2023-06-08 DIAGNOSIS — M539 Dorsopathy, unspecified: Secondary | ICD-10-CM | POA: Diagnosis not present

## 2023-06-10 DIAGNOSIS — Z79899 Other long term (current) drug therapy: Secondary | ICD-10-CM | POA: Diagnosis not present

## 2023-06-15 NOTE — Progress Notes (Signed)
  Care Coordination Note  06/15/2023 Name: [name] MRN: [id number] DOB: 1956/03/27  [name] is a 67 y.o. year old female who is a primary care patient of Margo Aye, Kathleene Hazel, MD and is actively engaged with the care management team. I reached out to [facility] by phone today to assist with re-scheduling an initial visit with the RN Case Manager  Follow up plan: Telephone appointment with care management team member scheduled for:06/16/23  [facility] Coordination Care Guide  Direct Dial: [phone]

## 2023-06-16 ENCOUNTER — Ambulatory Visit: Payer: Self-pay | Admitting: *Deleted

## 2023-06-16 NOTE — Patient Outreach (Signed)
Care Coordination   Follow Up Visit Note   06/16/2023 Name: [name] MRN: [id number] DOB: [birth year]  [name] is a 67 y.o. year old female who sees [name], Jasmine Hazel, MD for primary care. I spoke with  [name] by phone today.  What matters to the patients health and wellness today?  Lost their Medicaid & united health care coverage for her and spouse.   $400 per week is what she brings home - $600 for ? food stamps Wife plan to visit local Department of Social Services (DSS)  today 06/16/23 Not aware of her assigned DSS staff member  Active with [facility] SW services   Husband has rash & she inquires about receiving home health RN /PT visits for him  Patient fell recently & has left hip pain but has not sought medical services related to finances, work schedule, caregiver stress  Care giver stress She reports she works then go home She works 25 hr /week generally from 5 am - 2 pm  Still has custody of grand daughter who is on [place], has ADHD & take medicines for sleep  Financial strain Bills-especially her electric/light bill is reported to be $1500  She reports she paid $500 on it on 06/15/23  3 pole lights & reports she is not able to think of any other continuous source of electricity that could be causing this elevation   06/16/23 1658 Patient did visit local DSS and she is reporting that the staff member there is requesting bills from 08/02/22 to present. She states she was informed that in order to get medicaid for the united healthcare medicare premiums she would have to have $1700+ bills. She reports she has already obtained the bills from pcp. "I may never get it again"  (related to [facility])  She agrees for RN CM to assist her with completing an online cone request for bills from 08/02/22 to present for her and [name]    Goals Addressed             This Visit's Progress    [facility] nurse care coordination service (insurance, finances, caregiver resources)   Not on track     Interventions Today    Flowsheet Row Most Recent Value  Chronic Disease   Chronic disease during today's visit Diabetes, Other  [back and left hip pain, loss of medicaid coverage to pay her [place] healthcare medicare coverage, Financial strain, caregiver stress]  General Interventions   General Interventions Discussed/Reviewed General Interventions Discussed, [employer], Doctor Visits, Communication with  Doctor Visits Discussed/Reviewed Doctor Visits Discussed, PCP  PCP/Specialist Visits Compliance with follow-up visit  Communication with --  [employer] phone numbers Contact Information  [phone] (Home Phone) email to cone HIIM/billing to obtain itemized bill info from 08/02/22 to present]  Exercise Interventions   Exercise Discussed/Reviewed Exercise Discussed, Assistive device use and maintanence  Education Interventions   Education Provided Provided Education  Provided Verbal Education On Blood Sugar Monitoring, Mental Health/Coping with Illness, Applications, When to see the doctor, [employer], Development worker, community  Mental Health Interventions   Mental Health Discussed/Reviewed Mental Health Discussed, Coping Strategies, Other  [caregiver stress]  Nutrition Interventions   Nutrition Discussed/Reviewed Nutrition Discussed, Supplemental nutrition  [need assist with food, had food stamps but decontinued]  Pharmacy Interventions   Pharmacy Dicussed/Reviewed Pharmacy Topics Discussed, Affording Medications  Safety Interventions   Safety Discussed/Reviewed Safety Discussed, Fall Risk  [Encouraged to seek medical services after she fell recently and hurt her  left hip. She  does not want to seek medical assistance as it will create a bill she reports she can not afford,]              SDOH assessments and interventions completed:  Yes  SDOH Interventions Today    Flowsheet Row Most Recent Value  SDOH Interventions   Food Insecurity Interventions Other (Comment),  AMB Referral  [referral to DSS/ [facility] SW ? loss of $600 food stamps]  Housing Interventions Intervention Not Indicated  Transportation Interventions Intervention Not Indicated  Utilities Interventions [id number] Referral  Financial Strain Interventions Other (Comment)  [patient active with [facility] SW]  Health Literacy Interventions Other (Comment)  [offered assist with DSS forms but she wants DSS staff to help her]        Care Coordination Interventions:  Yes, provided   Follow up plan: Follow up call scheduled for 07/01/23    Encounter Outcome:  Pt. Visit Completed    L. Noelle Penner, RN, BSN, CCM [facility] Care Management Community Coordinator Office number [phone]

## 2023-06-16 NOTE — Patient Instructions (Signed)
Visit Information  Thank you for taking time to visit with me today. Please don't hesitate to contact me if I can be of assistance to you.   Following are the goals we discussed today:   Goals Addressed             This Visit's Progress    [facility] nurse care coordination service (insurance, finances, caregiver resources)   Not on track    Interventions Today    Flowsheet Row Most Recent Value  Chronic Disease   Chronic disease during today's visit Diabetes, Other  [back and left hip pain, loss of medicaid coverage to pay her [place] healthcare medicare coverage, Financial strain, caregiver stress]  General Interventions   General Interventions Discussed/Reviewed General Interventions Discussed, [employer], Doctor Visits, Communication with  Doctor Visits Discussed/Reviewed Doctor Visits Discussed, PCP  PCP/Specialist Visits Compliance with follow-up visit  Communication with --  [employer] phone numbers Contact Information  [phone] (Home Phone) email to cone HIIM/billing to obtain itemized bill info from 08/02/22 to present]  Exercise Interventions   Exercise Discussed/Reviewed Exercise Discussed, Assistive device use and maintanence  Education Interventions   Education Provided Provided Education  Provided Verbal Education On Blood Sugar Monitoring, Mental Health/Coping with Illness, Applications, When to see the doctor, [employer], Development worker, community  Mental Health Interventions   Mental Health Discussed/Reviewed Mental Health Discussed, Coping Strategies, Other  [caregiver stress]  Nutrition Interventions   Nutrition Discussed/Reviewed Nutrition Discussed, Supplemental nutrition  [need assist with food, had food stamps but decontinued]  Pharmacy Interventions   Pharmacy Dicussed/Reviewed Pharmacy Topics Discussed, Affording Medications  Safety Interventions   Safety Discussed/Reviewed Safety Discussed, Fall Risk  [Encouraged to seek medical services after she fell  recently and hurt her left hip. She  does not want to seek medical assistance as it will create a bill she reports she can not afford,]              Our next appointment is by telephone on 07/01/23 at 2:30 pm  Please call the care guide team at [phone] if you need to cancel or reschedule your appointment.   If you are experiencing a Mental Health or Behavioral Health Crisis or need someone to talk to, please call the Suicide and Crisis Lifeline: 988 call the [place] National Suicide Prevention Lifeline: 802-117-2057 or TTY: 443-340-8092 TTY 912-579-9293) to talk to a trained counselor call 1-800-273-TALK (toll free, 24 hour hotline) call the [facility]: 2177173112 call 911   No computer access, no preference for copy of AVS    The patient has been provided with contact information for the care management team and has been advised to call with any health related questions or concerns.    L. Noelle Penner, RN, BSN, CCM [facility] Care Management Community Coordinator Office number [phone]

## 2023-06-18 ENCOUNTER — Encounter: Payer: Self-pay | Admitting: *Deleted

## 2023-06-18 ENCOUNTER — Ambulatory Visit: Payer: Self-pay | Admitting: *Deleted

## 2023-06-18 NOTE — Patient Outreach (Signed)
  Care Coordination   Multidisciplinary Case Review Note    06/18/2023 Name: [name] MRN: [id number] DOB: 1956/03/09  [name] is a 67 y.o. year old female who sees Margo Aye, Kathleene Hazel, MD for primary care.  The  multidisciplinary care team met today to review patient care needs and barriers.     Goals Addressed             This Visit's Progress    [facility] nurse care coordination service (insurance, finances, caregiver resources)   Not on track    Interventions Today    Flowsheet Row Most Recent Value  Chronic Disease   Chronic disease during today's visit Other  [[facility] case discussion , insurance loss, rash/home health nurse]  General Interventions   General Interventions Discussed/Reviewed Communication with  Communication with RN, Social Work  [facility] multidisciplinary case discussion - for patient related to loss of insurance coverage, caregiver to spouse, outreach to [employer] health billing department to leave a voice message requesting an itemized bill for patient & spouse for 08/02/22 to present]              SDOH assessments and interventions completed:  No     Care Coordination Interventions Activated:  Yes   Care Coordination Interventions:  Yes, provided   Follow up plan: Follow up call scheduled for 07/01/23    Multidisciplinary Team Attendees:   Edd Arbour, Iona Coach Saporito  Scribe for Multidisciplinary Case Review:  Kathreen Cosier. Noelle Penner, RN, BSN, CCM [facility] Care Management Community Coordinator Office number [phone]

## 2023-06-19 ENCOUNTER — Ambulatory Visit: Payer: Self-pay | Admitting: *Deleted

## 2023-06-19 DIAGNOSIS — R350 Frequency of micturition: Secondary | ICD-10-CM | POA: Insufficient documentation

## 2023-06-19 NOTE — Patient Outreach (Signed)
  Care Coordination   06/19/2023  Name: ELIYANNA FOLAN MRN: [id number] DOB: 1956/01/12   Care Coordination Outreach Attempts:  An unsuccessful telephone outreach was attempted today to offer the patient information about available care coordination services.HIPAA compliant messages left on voicemail, providing contact information for CSW, encouraging patient to return CSW's call at her earliest convenience.  Follow Up Plan:  Additional outreach attempts will be made to offer the patient care coordination information and services.   Encounter Outcome:  No Answer.   Care Coordination Interventions:  No, not indicated.    Danford Bad, BSW, MSW, LCSW  Licensed Restaurant manager, [employer] [facility]  Mailing [place] [address], [place], [place] [address] Physical Address-[address], [place], [place] [address] Toll Free Main # [phone] Fax # [phone] Cell # [phone] [email or web address]

## 2023-07-01 ENCOUNTER — Ambulatory Visit: Payer: Self-pay | Admitting: *Deleted

## 2023-07-02 ENCOUNTER — Ambulatory Visit: Payer: Self-pay | Admitting: *Deleted

## 2023-07-02 ENCOUNTER — Telehealth: Payer: Self-pay | Admitting: *Deleted

## 2023-07-02 NOTE — Patient Outreach (Addendum)
  Care Coordination   Multidisciplinary Case Review Note    07/02/2023 Name: [name] MRN: [id number] DOB: Nov 30, 1955  [name] is a 67 y.o. year old female who sees Margo Aye, Kathleene Hazel, MD for primary care.  The  multidisciplinary care team met today to review patient care needs and barriers.     Goals Addressed             This Visit's Progress    [facility] nurse care coordination service (insurance, finances, caregiver resources)   On track    Care coordination goals Patient will be able to provided needed financial information to local DSS for medicaid services re instatement    Interventions Today    Flowsheet Row Most Recent Value  Chronic Disease   Chronic disease during today's visit Other  [thn multidisciplinary discuss - recommendation continue follow up & rn cm interventions]  General Interventions   General Interventions Discussed/Reviewed Communication with  Communication with RN, Social Work  Karlyne Greenspan to [employer] on 07/01/23, Inquired of flooring resource to [facility] SW as patient non ACO]               SDOH assessments and interventions completed:  Yes  SDOH Interventions Today    Flowsheet Row Most Recent Value  SDOH Interventions   Housing Interventions Other (Comment)  [outreach to [facility] SW as patient is non ACO about Buyer, retail for flooring]        Care Coordination Interventions Activated:  Yes   Care Coordination Interventions:  Yes, provided   Follow up plan: Follow up call scheduled for 07/07/23    Multidisciplinary Team Attendees:   Edd Arbour, Demetrios Loll  Scribe for Multidisciplinary Case Review:  Kathreen Cosier. Noelle Penner, RN, BSN, CCM [facility] Care Management Community Coordinator Office number [phone]

## 2023-07-02 NOTE — Patient Instructions (Addendum)
Visit Information  Thank you for taking time to visit with me today. Please don't hesitate to contact me if I can be of assistance to you.   Following are the goals we discussed today:   Goals Addressed             This Visit's Progress    [facility] nurse care coordination service (insurance, finances, caregiver resources)   On track    Care coordination goals Patient will be able to provided needed financial information to local DSS for medicaid services re instatement    Interventions Today    Flowsheet Row Most Recent Value  Chronic Disease   Chronic disease during today's visit Other  General Interventions   General Interventions Discussed/Reviewed [employer], Doctor Visits, Communication with  Doctor Visits Discussed/Reviewed Doctor Visits Reviewed, PCP  PCP/Specialist Visits Compliance with follow-up visit  Communication with Social Work, PCP/Specialists  [Conference call with wife to DSS case worker Scientist, research (medical), unsuccesful outreach x 2 to [employer] Left a voice message on 07/02/23]  Education Interventions   Education Provided Provided Education  Provided Verbal Education On [employer], [employer], Other  Mental Health Interventions   Mental Health Discussed/Reviewed Mental Health Reviewed, Coping Strategies              Our next appointment is  on 07/07/23 at 3:30 pm  Please call the care guide team at [phone] if you need to cancel or reschedule your appointment.   If you are experiencing a Mental Health or Behavioral Health Crisis or need someone to talk to, please call the Suicide and Crisis Lifeline: 988 call the [place] National Suicide Prevention Lifeline: (660)317-2013 or TTY: 7064130828 TTY (989)476-5496) to talk to a trained counselor call 1-800-273-TALK (toll free, 24 hour hotline) call the [facility]: [phone] call 911    No computer access, no preference for copy of AVS    The patient has been  provided with contact information for the care management team and has been advised to call with any health related questions or concerns.   Staphanie Harbison L. Noelle Penner, RN, BSN, CCM [facility] Care Management Community Coordinator Office number [phone]

## 2023-07-02 NOTE — Patient Outreach (Signed)
  Care Coordination   Follow Up Visit Note   07/02/2023 Name: DILYN MCDAVITT MRN: [id number] DOB: 12-12-1955  CHYLEE KONKEL is a 67 y.o. year old female who sees Margo Aye, Kathleene Hazel, MD for primary care. I spoke with  Verdis Prime [place] by phone today.  What matters to the patients health and wellness today?  Medicaid services re instatement & not being able to qualify for food stamps as a result   Patient reports she had a "spell'' 06/30/23 that caused her to have to go to bed at 5 pm which is unusual for her  She reports her spell included dizziness, "feeling funny in the head", chest pounding She has a past medical history (PMH) of Coronary atherosclerosis, Hypertension, carotid artery stenosis, & sinus bradycardia, precordial chest pain, generalized anxiety disorder/major depressive disorder, systolic murmur  She had labs completed for her pcp on 06/30/23, Pending results and upcoming office visit  She wants her pcp to be aware that the local infusion center informed her she needs B 12 shots     Goals Addressed             This Visit's Progress    [facility] nurse care coordination service (insurance, finances, caregiver resources)   On track    Care coordination goals Patient will be able to provided needed financial information to local DSS for medicaid services re instatement    Interventions Today    Flowsheet Row Most Recent Value  Chronic Disease   Chronic disease during today's visit Other  General Interventions   General Interventions Discussed/Reviewed [employer], Doctor Visits, Communication with  Doctor Visits Discussed/Reviewed Doctor Visits Reviewed, PCP  PCP/Specialist Visits Compliance with follow-up visit  Communication with Social Work, PCP/Specialists  [Conference call with wife to DSS case worker Scientist, research (medical), unsuccesful outreach x 2 to [employer] Left a voice message on 07/02/23]  Education Interventions   Education Provided Provided Education  Provided Verbal  Education On [employer], Development worker, community, Other  Mental Health Interventions   Mental Health Discussed/Reviewed Mental Health Reviewed, Coping Strategies              SDOH assessments and interventions completed:  Yes     Care Coordination Interventions:  Yes, provided   Follow up plan: Follow up call scheduled for 07/07/23    Encounter Outcome:  Pt. Visit Completed   Yaremi Stahlman L. Noelle Penner, RN, BSN, CCM [facility] Care Management Community Coordinator Office number [phone]

## 2023-07-02 NOTE — Progress Notes (Signed)
  Care Coordination Note  07/02/2023 Name: [name] MRN: [id number] DOB: 09-May-1956  [name] is a 67 y.o. year old female who is a primary care patient of Margo Aye, Kathleene Hazel, MD and is actively engaged with the care management team. I reached out to [facility] by phone today to assist with re-scheduling a follow up visit with the Licensed Clinical Social Worker  Follow up plan: Unsuccessful telephone outreach attempt made.   [facility]  Care Coordination Care Guide  Direct Dial: [phone]

## 2023-07-06 ENCOUNTER — Ambulatory Visit: Payer: Self-pay | Admitting: Licensed Clinical Social Worker

## 2023-07-06 NOTE — Patient Instructions (Signed)
Visit Information  Thank you for taking time to visit with me today. Please don't hesitate to contact me if I can be of assistance to you.   Following are the goals we discussed today:   Goals Addressed             This Visit's Progress    Patient Stated she has financial needs, medical needs and family stress needs       Interventions:  Spoke with client via phone today about client status and her needs of flooring repair in her home bathroom Spoke with client about [facility] in [place], [place] and applying for help through that agency. LCSW gave client the phone number for [facility] and encouraged client to call number to request an application for help be mailed to her for her to complete Spoke of needs of client spouse Discussed work schedule of client Discussed fact that client has several relatives residing with her in her mobile home and could use help in bathroom repair and flooring repairs Discussed coping skills of client.  Encouraged client to call LCSW as needed for SW support at [phone] Provided counseling support for [facility]. Encouraged Giana to reach out to [employer] which sometimes helps with home repairs for those in needs. She said she would call that church           Our next appointment is by telephone on 08/03/23 at 9:30 AM    Please call the care guide team at [phone] if you need to cancel or reschedule your appointment.   If you are experiencing a Mental Health or Behavioral Health Crisis or need someone to talk to, please go to [facility] [address] [place] [phone])   The patient verbalized understanding of instructions, educational materials, and care plan provided today and DECLINED offer to receive copy of patient instructions, educational materials, and care plan.   The patient has been provided with contact information for the care management team and has been advised to call with any  health related questions or concerns.   Kelton Pillar.Nehal Witting MSW, LCSW Licensed Visual merchandiser [facility] Care Management [phone]

## 2023-07-06 NOTE — Patient Outreach (Signed)
  Care Coordination   Follow Up Visit Note   07/06/2023 Name: [name] MRN: [id number] DOB: 09-18-1956  [name] is a 67 y.o. year old female who sees Margo Aye, Kathleene Hazel, MD for primary care. I spoke with  [name] by phone today.  What matters to the patients health and wellness today?  Patient has financial needs. She needs flooring repair completed at her home    Goals Addressed             This Visit's Progress    Patient Stated she has financial needs, medical needs and family stress needs       Interventions:  Spoke with client via phone today about client status and her needs of flooring repair in her home bathroom Spoke with client about [facility] in [place], [place] and applying for help through that agency. LCSW gave client the phone number for [facility] and encouraged client to call number to request an application for help be mailed to her for her to complete Spoke of needs of client spouse Discussed work schedule of client Discussed fact that client has several relatives residing with her in her mobile home and could use help in bathroom repair and flooring repairs Discussed coping skills of client.  Encouraged client to call LCSW as needed for SW support at [phone] Provided counseling support for [facility]. Encouraged [name] to reach out to [employer] which sometimes helps with home repairs for those in needs. She said she would call that church           SDOH assessments and interventions completed:  Yes  SDOH Interventions Today    Flowsheet Row Most Recent Value  SDOH Interventions   Depression Interventions/Treatment  Counseling  Physical Activity Interventions Other (Comments)  [some mobility issues]  Stress Interventions Provide Counseling  [financial stress, needs floor repaired in her bathroom]        Care Coordination Interventions:  Yes, provided    Interventions Today    Flowsheet Row Most Recent Value  Chronic Disease    Chronic disease during today's visit Other  [spoke with client about client needs]  General Interventions   General Interventions Discussed/Reviewed General Interventions Discussed, Psychiatrist Interventions   Education Provided Provided Education  Provided Engineer, petroleum On [employer] resources with [employer], [place], [place]. Discussed possible help with local church in area]  Mental Health Interventions   Mental Health Discussed/Reviewed Coping Strategies  [has stress related to finances and related to home repair]  Pharmacy Interventions   Pharmacy Dicussed/Reviewed Pharmacy Topics Discussed       Follow up plan: Follow up call scheduled for 08/03/23 at 9:30 AM    Encounter Outcome:  Pt. Visit Completed   Kelton Pillar.Tyese Finken MSW, LCSW Licensed Visual merchandiser [facility] Care Management [phone]

## 2023-07-07 ENCOUNTER — Ambulatory Visit: Payer: Self-pay | Admitting: *Deleted

## 2023-07-07 NOTE — Patient Outreach (Signed)
  Care Coordination   07/07/2023 Name: [name] MRN: [id number] DOB: 1956/05/06   Care Coordination Outreach Attempts:  An unsuccessful telephone outreach was attempted for a scheduled appointment today.  Follow Up Plan:  Additional outreach attempts will be made to offer the patient care coordination information and services.   Encounter Outcome:  No Answer   Care Coordination Interventions:  No, not indicated    Jabree Rebert L. Noelle Penner, RN, BSN, CCM [facility] Care Management Community Coordinator Office number [phone]

## 2023-07-07 NOTE — Patient Outreach (Signed)
  Care Coordination   Follow Up Visit Note   02/12/2024 updated note for 07/07/23 Name: [name] MRN: [id number] DOB: 08-08-1956  [name] is a 67 y.o. year old female who sees Margo Aye, Kathleene Hazel, MD for primary care. I spoke with  Verdis Prime [place] by phone today.  What matters to the patients health and wellness today?  Pending follow up of resources provided for her and spouse from RN CM & LCSW   Voiced understanding of discussed resources: [employer]  [address] [place]  brian [name] [phone] and/or [name] [phone]  Cooperative [address] Ministry Address: Sites vary. Must call first.  Food Pantry locations: [address], [place], [place] [address] [employer] Phone: [phone] Website: none Service(s) Offered: Museum/gallery curator, utility assistance if funds available Careers adviser for all of [place], [employer], [employer], Office manager and [employer] for [employer] area only) Walk-in  current Id and current address verification required. WedThurs: 9:30-12:00   Goals Addressed             This Visit's Progress    Scientist, water quality, finances, caregiver resources)-[facility] nurse care coordination service       Interventions Today    Flowsheet Row Most Recent Value  Chronic Disease   Chronic disease during today's visit Other  [Medicaid, home flooring]  General Interventions   General Interventions Discussed/Reviewed General Interventions Reviewed, Communication with, Community Resources  PCP/Specialist Visits Compliance with follow-up visit  Communication with PCP/Specialists  [Completed a conference call with patient to [place] [employer] in [place]. Provided her with the [facility] [place] information to offer to her son.  Provided her with an [place] food pantry for Wednesday & Thursday [phone] info]  Education Interventions   Education Provided Provided Education  Provided Verbal Education On [employer] christian ministry (food+), [employer]]              SDOH assessments and interventions completed:  No     Care Coordination Interventions:  No, not indicated   Follow up plan: Follow up call scheduled for 07/16/23    Encounter Outcome:  Pt. Visit Completed   Jasmine Gregg L. Noelle Penner, RN, BSN, CCM [facility] Care Management Community Coordinator Office number [phone]

## 2023-07-09 NOTE — Progress Notes (Signed)
  Care Coordination Note  07/09/2023 Name: PRUDIE NESTOR MRN: [id number] DOB: [birth year]  [name] is a 67 y.o. year old female who is a primary care patient of Margo Aye, Kathleene Hazel, MD and is actively engaged with the care management team. I reached out to [facility] by phone today to assist with re-scheduling a follow up visit with the RN Case Manager  Follow up plan: Unsuccessful telephone outreach attempt made. A HIPAA compliant phone message was left for the patient providing contact information and requesting a return call.   [facility]  Care Coordination Care Guide  Direct Dial: [phone]

## 2023-07-10 NOTE — Progress Notes (Signed)
  Care Coordination Note  07/10/2023 Name: [name] MRN: [id number] DOB: 09-25-1956  [name] is a 67 y.o. year old female who is a primary care patient of Margo Aye, Kathleene Hazel, MD and is actively engaged with the care management team. I reached out to [facility] by phone today to assist with re-scheduling an initial visit with the Licensed Clinical Social Worker  Follow up plan: Unsuccessful telephone outreach attempt made. A HIPAA compliant phone message was left for the patient providing contact information and requesting a return call.  We have been unable to make contact with the patient for follow up. The care management team is available to follow up with the patient after provider conversation with the patient regarding recommendation for care management engagement and subsequent re-referral to the care management team.   [facility] Coordination Care Guide  Direct Dial: [phone]

## 2023-07-14 DIAGNOSIS — M5136 Other intervertebral disc degeneration, lumbar region: Secondary | ICD-10-CM | POA: Diagnosis not present

## 2023-07-14 DIAGNOSIS — M539 Dorsopathy, unspecified: Secondary | ICD-10-CM | POA: Diagnosis not present

## 2023-07-14 DIAGNOSIS — I10 Essential (primary) hypertension: Secondary | ICD-10-CM | POA: Diagnosis not present

## 2023-07-14 DIAGNOSIS — M25559 Pain in unspecified hip: Secondary | ICD-10-CM | POA: Diagnosis not present

## 2023-07-14 DIAGNOSIS — F419 Anxiety disorder, unspecified: Secondary | ICD-10-CM | POA: Diagnosis not present

## 2023-07-14 DIAGNOSIS — Z79899 Other long term (current) drug therapy: Secondary | ICD-10-CM | POA: Diagnosis not present

## 2023-07-14 DIAGNOSIS — Z6833 Body mass index (BMI) 33.0-33.9, adult: Secondary | ICD-10-CM | POA: Diagnosis not present

## 2023-07-16 ENCOUNTER — Ambulatory Visit: Payer: Self-pay | Admitting: *Deleted

## 2023-07-16 NOTE — Patient Outreach (Signed)
  Care Coordination   07/16/2023 Name: [name] MRN: [id number] DOB: Aug 23, 1956   Care Coordination Outreach Attempts:  An unsuccessful telephone outreach was attempted today to offer the patient information about available care coordination services.  Follow Up Plan:  Additional outreach attempts will be made to offer the patient care coordination information and services.   Encounter Outcome:  No Answer   Care Coordination Interventions:  No, not indicated     Teffany Blaszczyk L. Noelle Penner, RN, BSN, CCM, Care Management Coordinator [phone]

## 2023-07-17 ENCOUNTER — Other Ambulatory Visit (HOSPITAL_COMMUNITY): Payer: Self-pay | Admitting: Family Medicine

## 2023-07-17 DIAGNOSIS — M25552 Pain in left hip: Secondary | ICD-10-CM | POA: Insufficient documentation

## 2023-07-17 DIAGNOSIS — R634 Abnormal weight loss: Secondary | ICD-10-CM

## 2023-07-17 DIAGNOSIS — R0989 Other specified symptoms and signs involving the circulatory and respiratory systems: Secondary | ICD-10-CM | POA: Insufficient documentation

## 2023-07-17 DIAGNOSIS — H9193 Unspecified hearing loss, bilateral: Secondary | ICD-10-CM | POA: Diagnosis not present

## 2023-07-17 DIAGNOSIS — R110 Nausea: Secondary | ICD-10-CM | POA: Diagnosis not present

## 2023-07-17 DIAGNOSIS — H9190 Unspecified hearing loss, unspecified ear: Secondary | ICD-10-CM | POA: Insufficient documentation

## 2023-07-17 DIAGNOSIS — R52 Pain, unspecified: Secondary | ICD-10-CM

## 2023-07-20 ENCOUNTER — Encounter: Payer: Self-pay | Admitting: *Deleted

## 2023-07-20 ENCOUNTER — Ambulatory Visit: Payer: Self-pay | Admitting: *Deleted

## 2023-07-21 ENCOUNTER — Encounter: Payer: Self-pay | Admitting: *Deleted

## 2023-07-21 ENCOUNTER — Ambulatory Visit: Payer: Self-pay | Admitting: *Deleted

## 2023-07-21 NOTE — Patient Outreach (Signed)
Care Coordination   Initial Visit Note   07/21/2023  Name: [name] MRN: [id number] DOB: 14-Nov-1955  [name] is a 67 y.o. year old female who sees Margo Aye, Kathleene Hazel, MD for primary care. I spoke with [name] by phone today.  What matters to the patients health and wellness today?  Engineer, manufacturing systems, Nurse, adult, [employer].   Goals Addressed               This Visit's Progress     Engineer, manufacturing systems, Services, & Resources. (pt-stated)   On track     Care Coordination Interventions:  Interventions Today    Flowsheet Row Most Recent Value  Chronic Disease   Chronic disease during today's visit Hypertension (HTN), Chronic Kidney Disease/End Stage Renal Disease (ESRD), Other  [Obesity, Major Depressive Disorder, Financial Insecurity, Generalized Anxiety Disorder, Fatigue, Chronic Pain Disorder]  General Interventions   General Interventions Discussed/Reviewed General Interventions Discussed, Annual Foot Exam, Labs, General Interventions Reviewed, Lipid Profile, Durable Medical Equipment (DME), Annual Eye Exam, Community Resources, Level of Care, Communication with, Sick Day Rules, Referral to Nurse, Health Screening, Vaccines, Doctor Visits  [Encouraged]  Labs Hgb A1c every 3 months, Kidney Function  [Encouraged]  Vaccines COVID-19, Flu, Pneumonia, RSV, Shingles, Tetanus/Pertussis/Diphtheria  [Encouraged]  Doctor Visits Discussed/Reviewed Specialist, Doctor Visits Discussed, Doctor Visits Reviewed, Annual Wellness Visits, PCP  [Encouraged]  Health Screening Bone Density, Colonoscopy, Mammogram  [Encouraged]  Durable Medical Equipment (DME) Bed side commode, BP Cuff, Glucomoter, Insulin Pump, Oxygen, Walker, Wheelchair  [Encouraged]  Wheelchair Standard  [Encouraged]  PCP/Specialist Visits Compliance with follow-up visit  [Encouraged]  Communication with PCP/Specialists, Charity fundraiser, Pharmacists, Social Work  [Encouraged]  Level of Care Adult Daycare, Air traffic controller,  Development worker, international aid, Assisted Living, Skilled Nursing Facility  [Encouraged]  Applications Medicaid, Personal Care Services, [place]  [Encouraged]  Exercise Interventions   Exercise Discussed/Reviewed Exercise Discussed, Assistive device use and maintanence, Exercise Reviewed, Physical Activity, Weight Managment  [Encouraged]  Physical Activity Discussed/Reviewed Physical Activity Discussed, Home Exercise Program (HEP), PREP, Physical Activity Reviewed, Gym, Types of exercise  [Encouraged]  Weight Management Weight loss  [Encouraged]  Education Interventions   Education Provided Provided Education, Provided Web-based Education, Provided Therapist, sports  [Encouraged]  Provided Engineer, petroleum On Nutrition, Mental Health/Coping with Illness, When to see the doctor, Sick Day Rules, [employer], Development worker, community, Medication, Exercise, Blood Sugar Monitoring, Applications, Labs, [employer], Foot Care  [Encouraged]  Labs Reviewed --  [N/A]  Applications Medicaid, Personal Care Services, [place]-2  [Encouraged]  Mental Health Interventions   Mental Health Discussed/Reviewed Mental Health Discussed, Anxiety, Depression, Grief and Loss, Mental Health Reviewed, Substance Abuse, Coping Strategies, Crisis, Suicide, Other  [Domestic Violence]  Nutrition Interventions   Nutrition Discussed/Reviewed Nutrition Discussed, Adding fruits and vegetables, Increasing proteins, Decreasing fats, Decreasing salt, Fluid intake, Nutrition Reviewed, Carbohydrate meal planning, Portion sizes, Decreasing sugar intake  [Encouraged]  Pharmacy Interventions   Pharmacy Dicussed/Reviewed Pharmacy Topics Discussed, Medications and their functions, Medication Adherence, Pharmacy Topics Reviewed, Affording Medications  [Encouraged]  Medication Adherence --  [N/A]  Safety Interventions   Safety Discussed/Reviewed Safety Discussed, Safety Reviewed, Fall Risk, Home Safety  [Encouraged]  Home Safety Assistive Devices, Need for home  safety assessment, Refer for home visit, Refer for community resources  [Encouraged]  Advanced Directive Interventions   Advanced Directives Discussed/Reviewed Advanced Directives Discussed  [Encouraged]      Assessed Social Determinant of Health Barriers. Discussed Plans for Ongoing Care Management Follow Up. Provided Careers information officer Information  for Care Management Team Members. Screened for Signs & Symptoms of Depression, Related to Chronic Disease State.  PHQ2 & PHQ9 Depression Screen Completed & Results Reviewed.  Suicidal Ideation & Homicidal Ideation Assessed - None Present.   Domestic Violence Assessed - None Present. Access to Weapons Assessed - None Present.   Active Listening & Reflection Utilized.  Verbalization of Feelings Encouraged.  Emotional Support Provided. Feelings of Caregiver Burnout & Fatigue Validated. Caregiver Stress Acknowledged. Caregiver Resources Reviewed. Caregiver Support Groups Mailed. Self-Enrollment in Caregiver Support Group of Interest Emphasized. Crisis Support Information, Agencies, Services, & Resources Discussed. Problem Solving Interventions Identified. Task-Centered Solutions Implemented.   Solution-Focused Strategies Developed. Acceptance & Commitment Therapy Introduced. Brief Cognitive Behavioral Therapy Initiated. Client-Centered Therapy Enacted. Reviewed Prescription Medications & Discussed Importance of Compliance. Encouraged Administration of Medications, Exactly as Prescribed. Quality of Sleep Assessed & Sleep Hygiene Techniques Promoted. Encouraged Increased Level of Activity & Exercise, as Tolerated. Discussed Higher Level of Care Placement Options (I.e. Memory Care Assisted Living, Extended Care, Skilled Nursing, Intermediate Level Care, Etc.) & Encouraged Consideration. Verified No In-Home Care Services, [employer], Warden/ranger, Etc., Covered Under Firefighter through [employer].    Confirmed Neither Patient, Nor Husband Were Veterans, Making Her Ineligible to Apply for Aid & Attendance Benefits, through [facility] [phone]). CSW Collaboration with Dr. Nita Sells, Primary Care Provider in [place], [place] [phone]), Via Routed Note in Epic, to Request Prescription for Nausea, Per Your Request. Encouraged Completion of Application for Food Stamps & Submission to The [facility] of Health & Human Services [phone]) for Processing. Encouraged Completion of Medicaid Application & Submission to The [facility] of Social Services [phone]) for Processing. Encouraged Completion of Application for Personal Care Services & Submission to [facility] [phone]) for Processing, Once Approved for Medicaid, through The [facility] of Social Services [phone]). Encouraged Self-Enrollment with Psychiatrist of Interest in [facility], from List Provided, to Receive Psychotropic Medication Administration & Management, in An Effort to Reduce & Manage Symptoms of Anxiety & Depression. Encouraged Self-Enrollment with Therapist of Interest in [facility], from List Provided, to Receive Psychotherapeutic Counseling & Supportive Services, in An Effort to Reduce & Manage Symptoms of Anxiety & Depression. Encouraged Contact with CSW (# [phone]), if You Have Questions, Need Assistance, or If Additional Social Work Needs Are Identified Between Now & Our Next Follow-Up Outreach Call, Scheduled on 08/04/2023 at 1:45 PM.        SDOH assessments and interventions completed:  Yes.  SDOH Interventions Today    Flowsheet Row Most Recent Value  SDOH Interventions   Food Insecurity Interventions Other (Comment)  [Provided List of Food Banks, Stage manager, & [employer] & Encouraged Referral to Meals on Wheels, through Aging, Disability, Technical brewer of [employer]  County]  Housing Interventions Intervention Not Indicated  Transportation Interventions Intervention Not Indicated, Patient Resources (Friends/Family)  Utilities Interventions Other (Comment)  [Provided Advertising account executive & Crisis Intervention Program Application]  Alcohol Usage Interventions Intervention Not Indicated (Score <7)  Depression Interventions/Treatment  Referral to Psychiatry, Medication, Counseling  [Provided Counseling Agencies, Nurse, adult, & Resources]  Financial Strain Interventions Other (Comment)  [Provided Merchant navy officer, Agencies, Nurse, adult, & Applications]  Physical Activity Interventions Patient Declined  Stress Interventions Offered [employer], Provide Counseling, Other (Comment)  [Provided Dietitian, Agencies, & Resources]  Social Connections Interventions Intervention Not Indicated  Health Literacy Interventions Intervention Not Indicated  Care Coordination Interventions:  Yes, provided.   Follow up plan: Follow up call scheduled for 08/04/2023 at 1:45 pm.  Encounter Outcome:  Patient Visit Completed.   Danford Bad, BSW, MSW, Printmaker Social Work Case Set designer Health  [facility], Population Health Direct Dial: [phone]  Fax: [phone] Email: [email or web address]

## 2023-07-21 NOTE — Patient Instructions (Signed)
Visit Information  Thank you for taking time to visit with me today. Please don't hesitate to contact me if I can be of assistance to you.   Following are the goals we discussed today:   Goals Addressed               This Visit's Progress     Engineer, manufacturing systems, Services, & Resources. (pt-stated)   On track     Care Coordination Interventions:  Interventions Today    Flowsheet Row Most Recent Value  Chronic Disease   Chronic disease during today's visit Hypertension (HTN), Chronic Kidney Disease/End Stage Renal Disease (ESRD), Other  [Obesity, Major Depressive Disorder, Financial Insecurity, Generalized Anxiety Disorder, Fatigue, Chronic Pain Disorder]  General Interventions   General Interventions Discussed/Reviewed General Interventions Discussed, Annual Foot Exam, Labs, General Interventions Reviewed, Lipid Profile, Durable Medical Equipment (DME), Annual Eye Exam, Community Resources, Level of Care, Communication with, Sick Day Rules, Referral to Nurse, Health Screening, Vaccines, Doctor Visits  [Encouraged]  Labs Hgb A1c every 3 months, Kidney Function  [Encouraged]  Vaccines COVID-19, Flu, Pneumonia, RSV, Shingles, Tetanus/Pertussis/Diphtheria  [Encouraged]  Doctor Visits Discussed/Reviewed Specialist, Doctor Visits Discussed, Doctor Visits Reviewed, Annual Wellness Visits, PCP  [Encouraged]  Health Screening Bone Density, Colonoscopy, Mammogram  [Encouraged]  Durable Medical Equipment (DME) Bed side commode, BP Cuff, Glucomoter, Insulin Pump, Oxygen, Walker, Wheelchair  [Encouraged]  Wheelchair Standard  [Encouraged]  PCP/Specialist Visits Compliance with follow-up visit  [Encouraged]  Communication with PCP/Specialists, Charity fundraiser, Pharmacists, Social Work  [Encouraged]  Level of Care Adult Daycare, Air traffic controller, Development worker, international aid, Assisted Living, Skilled Nursing Facility  [Encouraged]  Applications Medicaid, Personal Care Services, [place]-2  [Encouraged]  Exercise  Interventions   Exercise Discussed/Reviewed Exercise Discussed, Assistive device use and maintanence, Exercise Reviewed, Physical Activity, Weight Managment  [Encouraged]  Physical Activity Discussed/Reviewed Physical Activity Discussed, Home Exercise Program (HEP), PREP, Physical Activity Reviewed, Gym, Types of exercise  [Encouraged]  Weight Management Weight loss  [Encouraged]  Education Interventions   Education Provided Provided Education, Provided Web-based Education, Provided Therapist, sports  [Encouraged]  Provided Engineer, petroleum On Nutrition, Mental Health/Coping with Illness, When to see the doctor, Sick Day Rules, [employer], Development worker, community, Medication, Exercise, Blood Sugar Monitoring, Applications, Labs, [employer], Foot Care  [Encouraged]  Labs Reviewed --  [N/A]  Applications Medicaid, Personal Care Services, [place]-2  [Encouraged]  Mental Health Interventions   Mental Health Discussed/Reviewed Mental Health Discussed, Anxiety, Depression, Grief and Loss, Mental Health Reviewed, Substance Abuse, Coping Strategies, Crisis, Suicide, Other  [Domestic Violence]  Nutrition Interventions   Nutrition Discussed/Reviewed Nutrition Discussed, Adding fruits and vegetables, Increasing proteins, Decreasing fats, Decreasing salt, Fluid intake, Nutrition Reviewed, Carbohydrate meal planning, Portion sizes, Decreasing sugar intake  [Encouraged]  Pharmacy Interventions   Pharmacy Dicussed/Reviewed Pharmacy Topics Discussed, Medications and their functions, Medication Adherence, Pharmacy Topics Reviewed, Affording Medications  [Encouraged]  Medication Adherence --  [N/A]  Safety Interventions   Safety Discussed/Reviewed Safety Discussed, Safety Reviewed, Fall Risk, Home Safety  [Encouraged]  Home Safety Assistive Devices, Need for home safety assessment, Refer for home visit, Refer for community resources  [Encouraged]  Advanced Directive Interventions   Advanced Directives Discussed/Reviewed  Advanced Directives Discussed  [Encouraged]      Active Listening & Reflection Utilized.  Verbalization of Feelings Encouraged.  Emotional Support Provided. Feelings of Caregiver Burnout & Fatigue Validated. Caregiver Resources Revisited. Caregiver Support Groups Reviewed. Utilization of Crisis Support Information, Agencies, Services, Tax inspector. Problem Solving Interventions Employed. Task-Centered Solutions Activated.  Solution-Focused Strategies Implemented. Acceptance & Commitment Therapy Indicated. Cognitive Behavioral Therapy Initiated. Client-Centered Therapy Conducted. Encouraged Administration of Medications, Exactly as Prescribed. Encouraged Increased Level of Activity & Exercise, as Tolerated. Encouraged Daily Implementation of Deep Breathing Exercises, Relaxation Techniques, & Mindfulness Meditation Strategies. Encouraged Consideration of Higher Level of Care Placement Options for Both You & Husband & Confirmed Disinterest at Present.  Encouraged Completion of Application for Food Stamps & Submission to The [facility] Department of Health & [employer] [phone]) for Processing. Encouraged Completion of Medicaid Application & Submission to The [facility] [place] [facility] of Social Services [phone]) for Processing. Encouraged Completion of Application for Personal Care Services & Submission to [facility] [phone]) for Processing, Once Approved for Medicaid, through The [facility] of Social Services [phone]). Encouraged Self-Enrollment with Psychiatrist of Interest in [facility], from List Provided, to Receive Psychotropic Medication Administration & Management, in An Effort to Reduce & Manage Symptoms of Anxiety & Depression. Encouraged Self-Enrollment with Therapist of Interest in [facility], from List Provided, to Receive Psychotherapeutic Counseling & Supportive Services, in An Effort  to Reduce & Manage Symptoms of Anxiety & Depression. Encouraged Self-Enrollment in Caregiver Support Group of Interest in [facility], from List Provided, in An Effort to Reduce & Manage Symptoms of Caregiver Burnout, Stress, & Fatigue. Encouraged Engagement with Lorna Few, Licensed Clinical Social Worker with [facility] [phone]), During Follow-Up Outreach Call, Scheduled on 08/03/2023 at 9:00 AM. Encouraged Contact with CSW (# [phone]), if You Have Questions, Need Assistance, or If Additional Social Work Needs Are Identified Between Now & Our Next Follow-Up Outreach Call, Scheduled on 08/04/2023 at 1:45 PM. Encouraged Attendance at Follow-Up Appointment with Sharlene Dory, Nurse Practitioner with [facility] [phone]), Scheduled on 08/20/2023 at 3:00 PM.      Our next appointment is by telephone on 08/04/2023 at 1:45 pm.  Please call the care guide team at [phone] if you need to cancel or reschedule your appointment.   If you are experiencing a Mental Health or Behavioral Health Crisis or need someone to talk to, please call the Suicide and Crisis Lifeline: 988 call the [place] National Suicide Prevention Lifeline: 616-420-8487 or TTY: 512-072-2726 TTY 515-880-7380) to talk to a trained counselor call 1-800-273-TALK (toll free, 24 hour hotline) go to [facility] [address], [place] [phone]) call the [facility] Crisis Line: [phone] call 911  Patient verbalizes understanding of instructions and care plan provided today and agrees to view in MyChart. Active MyChart status and patient understanding of how to access instructions and care plan via MyChart confirmed with patient.     Telephone follow up appointment with care management team member scheduled for:  08/04/2023 at 1:45 pm.  Danford Bad, BSW, MSW, LCSW  Embedded Practice Social Work Case Manager  [facility], Population Health Direct Dial: [phone]  Fax: [phone] Email: [email or web address]: [email or web address]

## 2023-07-21 NOTE — Patient Outreach (Signed)
Care Coordination   Follow Up Visit Note   07/21/2023  Name: [name] MRN: [id number] DOB: 28-Oct-1956  [name] is a 67 y.o. year old female who sees Margo Aye, Kathleene Hazel, MD for primary care. I spoke with Verdis Prime Candelaria by phone today.  What matters to the patients health and wellness today?  Engineer, manufacturing systems, Nurse, adult, [employer].    Goals Addressed               This Visit's Progress     Engineer, manufacturing systems, Services, & Resources. (pt-stated)   On track     Care Coordination Interventions:  Interventions Today    Flowsheet Row Most Recent Value  Chronic Disease   Chronic disease during today's visit Hypertension (HTN), Chronic Kidney Disease/End Stage Renal Disease (ESRD), Other  [Obesity, Major Depressive Disorder, Financial Insecurity, Generalized Anxiety Disorder, Fatigue, Chronic Pain Disorder]  General Interventions   General Interventions Discussed/Reviewed General Interventions Discussed, Annual Foot Exam, Labs, General Interventions Reviewed, Lipid Profile, Durable Medical Equipment (DME), Annual Eye Exam, Community Resources, Level of Care, Communication with, Sick Day Rules, Referral to Nurse, Health Screening, Vaccines, Doctor Visits  [Encouraged]  Labs Hgb A1c every 3 months, Kidney Function  [Encouraged]  Vaccines COVID-19, Flu, Pneumonia, RSV, Shingles, Tetanus/Pertussis/Diphtheria  [Encouraged]  Doctor Visits Discussed/Reviewed Specialist, Doctor Visits Discussed, Doctor Visits Reviewed, Annual Wellness Visits, PCP  [Encouraged]  Health Screening Bone Density, Colonoscopy, Mammogram  [Encouraged]  Durable Medical Equipment (DME) Bed side commode, BP Cuff, Glucomoter, Insulin Pump, Oxygen, Walker, Wheelchair  [Encouraged]  Wheelchair Standard  [Encouraged]  PCP/Specialist Visits Compliance with follow-up visit  [Encouraged]  Communication with PCP/Specialists, Charity fundraiser, Pharmacists, Social Work  [Encouraged]  Level of Care Adult Daycare, Air traffic controller,  Development worker, international aid, Assisted Living, Skilled Nursing Facility  [Encouraged]  Applications Medicaid, Personal Care Services, [place]-2  [Encouraged]  Exercise Interventions   Exercise Discussed/Reviewed Exercise Discussed, Assistive device use and maintanence, Exercise Reviewed, Physical Activity, Weight Managment  [Encouraged]  Physical Activity Discussed/Reviewed Physical Activity Discussed, Home Exercise Program (HEP), PREP, Physical Activity Reviewed, Gym, Types of exercise  [Encouraged]  Weight Management Weight loss  [Encouraged]  Education Interventions   Education Provided Provided Education, Provided Web-based Education, Provided Therapist, sports  [Encouraged]  Provided Engineer, petroleum On Nutrition, Mental Health/Coping with Illness, When to see the doctor, Sick Day Rules, [employer], Development worker, community, Medication, Exercise, Blood Sugar Monitoring, Applications, Labs, [employer], Foot Care  [Encouraged]  Labs Reviewed --  [N/A]  Applications Medicaid, Personal Care Services, [place]-2  [Encouraged]  Mental Health Interventions   Mental Health Discussed/Reviewed Mental Health Discussed, Anxiety, Depression, Grief and Loss, Mental Health Reviewed, Substance Abuse, Coping Strategies, Crisis, Suicide, Other  [Domestic Violence]  Nutrition Interventions   Nutrition Discussed/Reviewed Nutrition Discussed, Adding fruits and vegetables, Increasing proteins, Decreasing fats, Decreasing salt, Fluid intake, Nutrition Reviewed, Carbohydrate meal planning, Portion sizes, Decreasing sugar intake  [Encouraged]  Pharmacy Interventions   Pharmacy Dicussed/Reviewed Pharmacy Topics Discussed, Medications and their functions, Medication Adherence, Pharmacy Topics Reviewed, Affording Medications  [Encouraged]  Medication Adherence --  [N/A]  Safety Interventions   Safety Discussed/Reviewed Safety Discussed, Safety Reviewed, Fall Risk, Home Safety  [Encouraged]  Home Safety Assistive Devices, Need for home  safety assessment, Refer for home visit, Refer for community resources  [Encouraged]  Advanced Directive Interventions   Advanced Directives Discussed/Reviewed Advanced Directives Discussed  [Encouraged]      Active Listening & Reflection Utilized.  Verbalization of Feelings Encouraged.  Emotional Support Provided. Feelings of  Caregiver Burnout & Fatigue Validated. Caregiver Resources Revisited. Caregiver Support Groups Reviewed. Utilization of Crisis Support Information, Agencies, Services, Tax inspector. Problem Solving Interventions Employed. Task-Centered Solutions Activated.   Solution-Focused Strategies Implemented. Acceptance & Commitment Therapy Indicated. Cognitive Behavioral Therapy Initiated. Client-Centered Therapy Conducted. Encouraged Administration of Medications, Exactly as Prescribed. Encouraged Increased Level of Activity & Exercise, as Tolerated. Encouraged Daily Implementation of Deep Breathing Exercises, Relaxation Techniques, & Mindfulness Meditation Strategies. Encouraged Consideration of Higher Level of Care Placement Options for Both You & Husband & Confirmed Disinterest at Present.  Encouraged Completion of Application for Food Stamps & Submission to [employer] Department of Health & [employer] [phone]) for Processing. Encouraged Completion of Medicaid Application & Submission to [facility] of Social Services [phone]) for Processing. Encouraged Completion of Application for Personal Care Services & Submission to [facility] [phone]) for Processing, Once Approved for Medicaid, through The [facility] of Social Services [phone]). Encouraged Self-Enrollment with Psychiatrist of Interest in [facility], from List Provided, to Receive Psychotropic Medication Administration & Management, in An Effort to Reduce & Manage Symptoms of Anxiety &  Depression. Encouraged Self-Enrollment with Therapist of Interest in [facility], from List Provided, to Receive Psychotherapeutic Counseling & Supportive Services, in An Effort to Reduce & Manage Symptoms of Anxiety & Depression. Encouraged Self-Enrollment in Caregiver Support Group of Interest in [facility], from List Provided, in An Effort to Reduce & Manage Symptoms of Caregiver Burnout, Stress, & Fatigue. Encouraged Engagement with Lorna Few, Licensed Clinical Social Worker with [facility] [phone]), During Follow-Up Outreach Call, Scheduled on 08/03/2023 at 9:00 AM. Encouraged Contact with CSW (# [phone]), if You Have Questions, Need Assistance, or If Additional Social Work Needs Are Identified Between Now & Our Next Follow-Up Outreach Call, Scheduled on 08/04/2023 at 1:45 PM. Encouraged Attendance at Follow-Up Appointment with Sharlene Dory, Nurse Practitioner with [facility] [place] [phone]), Scheduled on 08/20/2023 at 3:00 PM.      SDOH assessments and interventions completed:  Yes.  Care Coordination Interventions:  Yes, provided.   Follow up plan: Follow up call scheduled for 08/04/2023 at 1:45 pm.  Encounter Outcome:  Patient Visit Completed.   Danford Bad, BSW, MSW, Printmaker Social Work Case Set designer Health  [facility], Population Health Direct Dial: [phone]  Fax: [phone] Email: [email or web address] Website: [email or web address]

## 2023-07-21 NOTE — Patient Instructions (Signed)
Visit Information  Thank you for taking time to visit with me today. Please don't hesitate to contact me if I can be of assistance to you.   Following are the goals we discussed today:   Goals Addressed               This Visit's Progress     Engineer, manufacturing systems, Services, & Resources. (pt-stated)   On track     Care Coordination Interventions:  Interventions Today    Flowsheet Row Most Recent Value  Chronic Disease   Chronic disease during today's visit Hypertension (HTN), Chronic Kidney Disease/End Stage Renal Disease (ESRD), Other  [Obesity, Major Depressive Disorder, Financial Insecurity, Generalized Anxiety Disorder, Fatigue, Chronic Pain Disorder]  General Interventions   General Interventions Discussed/Reviewed General Interventions Discussed, Annual Foot Exam, Labs, General Interventions Reviewed, Lipid Profile, Durable Medical Equipment (DME), Annual Eye Exam, Community Resources, Level of Care, Communication with, Sick Day Rules, Referral to Nurse, Health Screening, Vaccines, Doctor Visits  [Encouraged]  Labs Hgb A1c every 3 months, Kidney Function  [Encouraged]  Vaccines COVID-19, Flu, Pneumonia, RSV, Shingles, Tetanus/Pertussis/Diphtheria  [Encouraged]  Doctor Visits Discussed/Reviewed Specialist, Doctor Visits Discussed, Doctor Visits Reviewed, Annual Wellness Visits, PCP  [Encouraged]  Health Screening Bone Density, Colonoscopy, Mammogram  [Encouraged]  Durable Medical Equipment (DME) Bed side commode, BP Cuff, Glucomoter, Insulin Pump, Oxygen, Walker, Wheelchair  [Encouraged]  Wheelchair Standard  [Encouraged]  PCP/Specialist Visits Compliance with follow-up visit  [Encouraged]  Communication with PCP/Specialists, Charity fundraiser, Pharmacists, Social Work  [Encouraged]  Level of Care Adult Daycare, Air traffic controller, Development worker, international aid, Assisted Living, Skilled Nursing Facility  [Encouraged]  Applications Medicaid, Personal Care Services, FL-2  [Encouraged]  Exercise  Interventions   Exercise Discussed/Reviewed Exercise Discussed, Assistive device use and maintanence, Exercise Reviewed, Physical Activity, Weight Managment  [Encouraged]  Physical Activity Discussed/Reviewed Physical Activity Discussed, Home Exercise Program (HEP), PREP, Physical Activity Reviewed, Gym, Types of exercise  [Encouraged]  Weight Management Weight loss  [Encouraged]  Education Interventions   Education Provided Provided Education, Provided Web-based Education, Provided Therapist, sports  [Encouraged]  Provided Engineer, petroleum On Nutrition, Mental Health/Coping with Illness, When to see the doctor, Sick Day Rules, [employer], Development worker, community, Medication, Exercise, Blood Sugar Monitoring, Applications, Labs, [employer], Foot Care  [Encouraged]  Labs Reviewed --  [N/A]  Applications Medicaid, Personal Care Services, [place]-2  [Encouraged]  Mental Health Interventions   Mental Health Discussed/Reviewed Mental Health Discussed, Anxiety, Depression, Grief and Loss, Mental Health Reviewed, Substance Abuse, Coping Strategies, Crisis, Suicide, Other  [Domestic Violence]  Nutrition Interventions   Nutrition Discussed/Reviewed Nutrition Discussed, Adding fruits and vegetables, Increasing proteins, Decreasing fats, Decreasing salt, Fluid intake, Nutrition Reviewed, Carbohydrate meal planning, Portion sizes, Decreasing sugar intake  [Encouraged]  Pharmacy Interventions   Pharmacy Dicussed/Reviewed Pharmacy Topics Discussed, Medications and their functions, Medication Adherence, Pharmacy Topics Reviewed, Affording Medications  [Encouraged]  Medication Adherence --  [N/A]  Safety Interventions   Safety Discussed/Reviewed Safety Discussed, Safety Reviewed, Fall Risk, Home Safety  [Encouraged]  Home Safety Assistive Devices, Need for home safety assessment, Refer for home visit, Refer for community resources  [Encouraged]  Advanced Directive Interventions   Advanced Directives Discussed/Reviewed  Advanced Directives Discussed  [Encouraged]      Assessed Social Determinant of Health Barriers. Discussed Plans for Ongoing Care Management Follow Up. Provided Careers information officer Information for Care Management Team Members. Screened for Signs & Symptoms of Depression, Related to Chronic Disease State.  PHQ2 & PHQ9 Depression Screen Completed & Results Reviewed.  Suicidal  Ideation & Homicidal Ideation Assessed - None Present.   Domestic Violence Assessed - None Present. Access to Weapons Assessed - None Present.   Active Listening & Reflection Utilized.  Verbalization of Feelings Encouraged.  Emotional Support Provided. Feelings of Caregiver Burnout & Fatigue Validated. Caregiver Stress Acknowledged. Caregiver Resources Reviewed. Caregiver Support Groups Mailed. Self-Enrollment in Caregiver Support Group of Interest Emphasized. Crisis Support Information, Agencies, Services, & Resources Discussed. Problem Solving Interventions Identified. Task-Centered Solutions Implemented.   Solution-Focused Strategies Developed. Acceptance & Commitment Therapy Introduced. Brief Cognitive Behavioral Therapy Initiated. Client-Centered Therapy Enacted. Reviewed Prescription Medications & Discussed Importance of Compliance. Encouraged Administration of Medications, Exactly as Prescribed. Quality of Sleep Assessed & Sleep Hygiene Techniques Promoted. Encouraged Increased Level of Activity & Exercise, as Tolerated. Discussed Higher Level of Care Placement Options (I.e. Memory Care Assisted Living, Extended Care, Skilled Nursing, Intermediate Level Care, Etc.) & Encouraged Consideration. Verified No In-Home Care Services, [employer], Warden/ranger, Etc., Covered Under Firefighter through [employer].   Confirmed Neither Patient, Nor Husband Were Veterans, Making Her Ineligible to Apply for Aid & Attendance Benefits, through [facility] [phone]). CSW Collaboration with Dr. Nita Sells, Primary Care Provider in [place], [place] [phone]), Via Routed Note in Epic, to Request Prescription for Nausea, Per Your Request. Encouraged Completion of Application for Food Stamps & Submission to [facility] of Health & Human Services [phone]) for Processing. Encouraged Completion of Medicaid Application & Submission to [facility] of Social Services [phone]) for Processing. Encouraged Completion of Application for Personal Care Services & Submission to [facility] [phone]) for Processing, Once Approved for Medicaid, through [facility] of Social Services [phone]). Encouraged Self-Enrollment with Psychiatrist of Interest in [facility], from List Provided, to Receive Psychotropic Medication Administration & Management, in An Effort to Reduce & Manage Symptoms of Anxiety & Depression. Encouraged Self-Enrollment with Therapist of Interest in [facility], from List Provided, to Receive Psychotherapeutic Counseling & Supportive Services, in An Effort to Reduce & Manage Symptoms of Anxiety & Depression. Encouraged Contact with CSW (# [phone]), if You Have Questions, Need Assistance, or If Additional Social Work Needs Are Identified Between Now & Our Next Follow-Up Outreach Call, Scheduled on 08/04/2023 at 1:45 PM.      Our next appointment is by telephone on 08/04/2023 at 1:45 pm.  Please call the care guide team at [phone] if you need to cancel or reschedule your appointment.   If you are experiencing a Mental Health or Behavioral Health Crisis or need someone to talk to, please call the Suicide and Crisis Lifeline: 988 call the [place] National Suicide Prevention Lifeline: 867-587-2175 or TTY: 210 179 2337 TTY 754-032-1594) to talk to a trained counselor call 1-800-273-TALK (toll free, 24 hour hotline) go to  [facility] [address], [place] [phone]) call the [facility] Crisis Line: [phone] call 911  Patient verbalizes understanding of instructions and care plan provided today and agrees to view in MyChart. Active MyChart status and patient understanding of how to access instructions and care plan via MyChart confirmed with patient.     Telephone follow up appointment with care management team member scheduled for:  08/04/2023 at 1:45 pm.  Danford Bad, BSW, MSW, LCSW  Embedded Practice Social Work Case Manager  [facility], Population Health Direct Dial: [phone]  Fax: [phone] Email: [email or web address]

## 2023-07-30 DIAGNOSIS — Z1231 Encounter for screening mammogram for malignant neoplasm of breast: Secondary | ICD-10-CM | POA: Diagnosis not present

## 2023-08-03 ENCOUNTER — Ambulatory Visit: Payer: Self-pay | Admitting: Licensed Clinical Social Worker

## 2023-08-03 NOTE — Patient Outreach (Signed)
Care Coordination   08/03/2023 Name: [name] MRN: [id number] DOB: 06-Dec-1955   Care Coordination Outreach Attempts:  An unsuccessful telephone outreach was attempted today to offer the patient information about available care coordination services.  Follow Up Plan:  Additional outreach attempts will be made to offer the patient care coordination information and services.   Encounter Outcome:  No Answer   Care Coordination Interventions:  No, not indicated    LCSW Danford Bad has scheduled call with client on 08/04/23 at 1:45 PM  Kelton Pillar.Marguarite Markov MSW, LCSW Licensed Visual merchandiser [facility] Care Management [phone]

## 2023-08-04 ENCOUNTER — Ambulatory Visit: Payer: Self-pay | Admitting: *Deleted

## 2023-08-04 ENCOUNTER — Ambulatory Visit (HOSPITAL_COMMUNITY): Admission: RE | Admit: 2023-08-04 | Payer: Medicare HMO | Source: Ambulatory Visit

## 2023-08-04 NOTE — Patient Outreach (Signed)
Care Coordination   Follow Up Visit Note   08/04/2023  Name: [name] MRN: [id number] DOB: 05-21-1956  [name] is a 67 y.o. year old female who sees [name], Jasmine Hazel, MD for primary care. I spoke with [name] by phone today.  What matters to the patients health and wellness today?  Engineer, manufacturing systems, Nurse, adult, [employer].    Goals Addressed               This Visit's Progress     Engineer, manufacturing systems, Services, & Resources. (pt-stated)   On track     Care Coordination Interventions:  Interventions Today    Flowsheet Row Most Recent Value  Chronic Disease   Chronic disease during today's visit Hypertension (HTN), Chronic Kidney Disease/End Stage Renal Disease (ESRD), Other  [Obesity, Major Depressive Disorder, Financial Insecurity, Generalized Anxiety Disorder, Fatigue, Chronic Pain Disorder]  General Interventions   General Interventions Discussed/Reviewed General Interventions Discussed, Annual Foot Exam, Labs, General Interventions Reviewed, Lipid Profile, Durable Medical Equipment (DME), Annual Eye Exam, Community Resources, Level of Care, Communication with, Sick Day Rules, Referral to Nurse, Health Screening, Vaccines, Doctor Visits  [Encouraged]  Labs Hgb A1c every 3 months, Kidney Function  [Encouraged]  Vaccines COVID-19, Flu, Pneumonia, RSV, Shingles, Tetanus/Pertussis/Diphtheria  [Encouraged]  Doctor Visits Discussed/Reviewed Specialist, Doctor Visits Discussed, Doctor Visits Reviewed, Annual Wellness Visits, PCP  [Encouraged]  Health Screening Bone Density, Colonoscopy, Mammogram  [Encouraged]  Durable Medical Equipment (DME) Bed side commode, BP Cuff, Glucomoter, Insulin Pump, Oxygen, Walker, Wheelchair  [Encouraged]  Wheelchair Standard  [Encouraged]  PCP/Specialist Visits Compliance with follow-up visit  [Encouraged]  Communication with PCP/Specialists, Charity fundraiser, Pharmacists, Social Work  [Encouraged]  Level of Care Adult Daycare, Air traffic controller,  Development worker, international aid, Assisted Living, Skilled Nursing Facility  [Encouraged]  Applications Medicaid, Personal Care Services, [place]-2  [Encouraged]  Exercise Interventions   Exercise Discussed/Reviewed Exercise Discussed, Assistive device use and maintanence, Exercise Reviewed, Physical Activity, Weight Managment  [Encouraged]  Physical Activity Discussed/Reviewed Physical Activity Discussed, Home Exercise Program (HEP), PREP, Physical Activity Reviewed, Gym, Types of exercise  [Encouraged]  Weight Management Weight loss  [Encouraged]  Education Interventions   Education Provided Provided Education, Provided Web-based Education, Provided Therapist, sports  [Encouraged]  Provided Engineer, petroleum On Nutrition, Mental Health/Coping with Illness, When to see the doctor, Sick Day Rules, [employer], Development worker, community, Medication, Exercise, Blood Sugar Monitoring, Applications, Labs, [employer], Foot Care  [Encouraged]  Labs Reviewed --  [N/A]  Applications Medicaid, Personal Care Services, [place]  [Encouraged]  Mental Health Interventions   Mental Health Discussed/Reviewed Mental Health Discussed, Anxiety, Depression, Grief and Loss, Mental Health Reviewed, Substance Abuse, Coping Strategies, Crisis, Suicide, Other  [Domestic Violence]  Nutrition Interventions   Nutrition Discussed/Reviewed Nutrition Discussed, Adding fruits and vegetables, Increasing proteins, Decreasing fats, Decreasing salt, Fluid intake, Nutrition Reviewed, Carbohydrate meal planning, Portion sizes, Decreasing sugar intake  [Encouraged]  Pharmacy Interventions   Pharmacy Dicussed/Reviewed Pharmacy Topics Discussed, Medications and their functions, Medication Adherence, Pharmacy Topics Reviewed, Affording Medications  [Encouraged]  Medication Adherence --  [N/A]  Safety Interventions   Safety Discussed/Reviewed Safety Discussed, Safety Reviewed, Fall Risk, Home Safety  [Encouraged]  Home Safety Assistive Devices, Need for home  safety assessment, Refer for home visit, Refer for community resources  [Encouraged]  Advanced Directive Interventions   Advanced Directives Discussed/Reviewed Advanced Directives Discussed  [Encouraged]      Active Listening & Reflection Utilized.  Verbalization of Feelings Encouraged.  Emotional Support Provided. Feelings of  Caregiver Stress, Burnout, & Fatigue Acknowledged. Problem Solving Interventions Implemented. Task-Centered Solutions Employed.   Solution-Focused Strategies Activated. Acceptance & Commitment Therapy Initiated Cognitive Behavioral Therapy Indicated. Client-Centered Therapy Performed. Confirmed Disinterest in Pursuing Higher Level of Care Placement Options for You, Husband, or Both, at Present Time.   Encouraged Review & Contact with [employer], Services, & Resources of Interest in [facility] [employer], Emailed ([email or web address]) on 08/04/2023, Which Include All of The Following:           ~ [employer] [phone]# [phone]) ~ [employer] 709-142-9574) ~ Lot 343-690-0770 (# 480-229-6798) ~ [facility] Department of Social Services                            [phone]# [phone]) ~ [employer] of [place] [phone]) ~ [employer] 3171419633# ([phone]) ~ [employer] 202-136-0251) ~ House of [place] (# 7814865227) ~ Help [employer]orporated (# 812-388-7390) ~ [place] Longest [phone]) ~ Aging, Disability, & Transit Services of [place]                            (# [phone]) ~ Energy Program 205-304-8285) ~ Low Income Energy Assistance Program (386)785-5899# 531-421-0401) ~ Venia Carbon (562)228-2686)  Encouraged Consideration of Applying for Food Stamps, Offering Assistance with Application Completion & Submission to The [facility] Department of Health & Human Services [phone]), for Processing. Encouraged Consideration of Applying for Medicaid, Offering Assistance  with Application  Completion & Submission to The [facility] of Social Services [phone]), for Processing. Encouraged Consideration of Applying for Personal Care Services, Offering Assistance with Application Completion & Submission to [facility] [phone]), for Processing. ~ If Approved for Medicaid, through The [facility] of Social Services [phone]). Encouraged Attendance at Follow-Up CT Scan with [facility] Radiology Department [phone]), Scheduled on 08/07/2023 at 4:00 PM. Encouraged Contact with CSW (# [phone]), if You Have Questions, Need Assistance, or If Additional Social Work Needs Are Identified Between Now & Our Next Follow-Up Outreach Call, Scheduled on 08/19/2023 at 10:30 AM.  Encouraged Attendance at Follow-Up Appointment with Sharlene Dory, Nurse Practitioner with [facility] [place] [phone]), Scheduled on 08/20/2023 at 3:00 PM. Encouraged Attendance at Follow-Up Appointment with Dr. Marton Redwood, Audiologist with [facility] [phone]), Scheduled on 08/25/2023 at 2:30 PM.      SDOH assessments and interventions completed:  Yes.  Care Coordination Interventions:  Yes, provided.   Follow up plan: Follow up call scheduled for 08/19/2023 at 10:30 am.  Encounter Outcome:  Patient Visit Completed.   Danford Bad, BSW, MSW, Printmaker Social Work Case Set designer Health  [facility], Population Health Direct Dial: [phone]  Fax: [phone] Email: [email or web address] Website: [email or web address]

## 2023-08-04 NOTE — Patient Instructions (Signed)
Visit Information  Thank you for taking time to visit with me today. Please don't hesitate to contact me if I can be of assistance to you.   Following are the goals we discussed today:   Goals Addressed               This Visit's Progress     Engineer, manufacturing systems, Services, & Resources. (pt-stated)   On track     Care Coordination Interventions:  Interventions Today    Flowsheet Row Most Recent Value  Chronic Disease   Chronic disease during today's visit Hypertension (HTN), Chronic Kidney Disease/End Stage Renal Disease (ESRD), Other  [Obesity, Major Depressive Disorder, Financial Insecurity, Generalized Anxiety Disorder, Fatigue, Chronic Pain Disorder]  General Interventions   General Interventions Discussed/Reviewed General Interventions Discussed, Annual Foot Exam, Labs, General Interventions Reviewed, Lipid Profile, Durable Medical Equipment (DME), Annual Eye Exam, Community Resources, Level of Care, Communication with, Sick Day Rules, Referral to Nurse, Health Screening, Vaccines, Doctor Visits  [Encouraged]  Labs Hgb A1c every 3 months, Kidney Function  [Encouraged]  Vaccines COVID-19, Flu, Pneumonia, RSV, Shingles, Tetanus/Pertussis/Diphtheria  [Encouraged]  Doctor Visits Discussed/Reviewed Specialist, Doctor Visits Discussed, Doctor Visits Reviewed, Annual Wellness Visits, PCP  [Encouraged]  Health Screening Bone Density, Colonoscopy, Mammogram  [Encouraged]  Durable Medical Equipment (DME) Bed side commode, BP Cuff, Glucomoter, Insulin Pump, Oxygen, Walker, Wheelchair  [Encouraged]  Wheelchair Standard  [Encouraged]  PCP/Specialist Visits Compliance with follow-up visit  [Encouraged]  Communication with PCP/Specialists, Charity fundraiser, Pharmacists, Social Work  [Encouraged]  Level of Care Adult Daycare, Air traffic controller, Development worker, international aid, Assisted Living, Skilled Nursing Facility  [Encouraged]  Applications Medicaid, Personal Care Services, [place]-2  [Encouraged]  Exercise  Interventions   Exercise Discussed/Reviewed Exercise Discussed, Assistive device use and maintanence, Exercise Reviewed, Physical Activity, Weight Managment  [Encouraged]  Physical Activity Discussed/Reviewed Physical Activity Discussed, Home Exercise Program (HEP), PREP, Physical Activity Reviewed, Gym, Types of exercise  [Encouraged]  Weight Management Weight loss  [Encouraged]  Education Interventions   Education Provided Provided Education, Provided Web-based Education, Provided Therapist, sports  [Encouraged]  Provided Engineer, petroleum On Nutrition, Mental Health/Coping with Illness, When to see the doctor, Sick Day Rules, [employer], Development worker, community, Medication, Exercise, Blood Sugar Monitoring, Applications, Labs, [employer], Foot Care  [Encouraged]  Labs Reviewed --  [N/A]  Applications Medicaid, Personal Care Services, [place]-2  [Encouraged]  Mental Health Interventions   Mental Health Discussed/Reviewed Mental Health Discussed, Anxiety, Depression, Grief and Loss, Mental Health Reviewed, Substance Abuse, Coping Strategies, Crisis, Suicide, Other  [Domestic Violence]  Nutrition Interventions   Nutrition Discussed/Reviewed Nutrition Discussed, Adding fruits and vegetables, Increasing proteins, Decreasing fats, Decreasing salt, Fluid intake, Nutrition Reviewed, Carbohydrate meal planning, Portion sizes, Decreasing sugar intake  [Encouraged]  Pharmacy Interventions   Pharmacy Dicussed/Reviewed Pharmacy Topics Discussed, Medications and their functions, Medication Adherence, Pharmacy Topics Reviewed, Affording Medications  [Encouraged]  Medication Adherence --  [N/A]  Safety Interventions   Safety Discussed/Reviewed Safety Discussed, Safety Reviewed, Fall Risk, Home Safety  [Encouraged]  Home Safety Assistive Devices, Need for home safety assessment, Refer for home visit, Refer for community resources  [Encouraged]  Advanced Directive Interventions   Advanced Directives Discussed/Reviewed  Advanced Directives Discussed  [Encouraged]      Active Listening & Reflection Utilized.  Verbalization of Feelings Encouraged.  Emotional Support Provided. Feelings of Caregiver Stress, Burnout, & Fatigue Acknowledged. Problem Solving Interventions Implemented. Task-Centered Solutions Employed.   Solution-Focused Strategies Activated. Acceptance & Commitment Therapy Initiated Cognitive Behavioral Therapy Indicated. Client-Centered Therapy Performed. Confirmed  Disinterest in Pursuing Higher Level of Care Placement Options for You, Husband, or Both, at Present Time.   Encouraged Review & Contact with [employer], Services, & Resources of Interest in [facility] [employer], Emailed ([email or web address]) on 08/04/2023, Which Include All of The Following:           ~ [employer] [phone]# ([phone]) ~ [employer] 702-437-8780) ~ Lot 947-218-3361 (# 775-155-4298) ~ [facility] Department of Social Services                            [phone]# [phone]) ~ [employer] of [place] 802-383-1585) ~ [employer]ries 6706461706# [phone]) ~ [employer] (204)049-2173) ~ House of [place] (# (587)663-7196) ~ Help [employer] (# 8578780045) ~ Mah[place] Longest 620-712-6930) ~ Aging, Disability, & Transit Services of [place]                            (# (959) [phone]) ~ Energy Program (703)547-8929) ~ Low Income Energy Assistance Program (214)163-3832# (239)494-8907) ~ Venia Carbon (702)344-8918)  Encouraged Consideration of Applying for Food Stamps, Offering Assistance with Application Completion & Submission to The [facility] Department of Health & Human Services [phone]), for Processing. Encouraged Consideration of Applying for Medicaid, Offering Assistance  with Application Completion & Submission to The [facility] of Social Services [phone]), for Processing. Encouraged Consideration of Applying for  Personal Care Services, Offering Assistance with Application Completion & Submission to [facility] [phone]), for Processing. ~ If Approved for Medicaid, through The [facility] of Social Services [phone]). Encouraged Engagement with Edd Arbour, Nurse Case Manager with [facility] [phone]), During Follow-Up Outreach Calls. Encouraged Engagement with [facility], Licensed Clinical Social Worker with [facility] [phone]), During Follow-Up Outreach Calls. Encouraged Attendance at Follow-Up CT Scan with [facility] Radiology Department [phone]), Scheduled on 08/07/2023 at 4:00 PM. Encouraged Contact with CSW (# [phone]), if You Have Questions, Need Assistance, or If Additional Social Work Needs Are Identified Between Now & Our Next Follow-Up Outreach Call, Scheduled on 08/19/2023 at 10:30 AM.  Encouraged Attendance at Follow-Up Appointment with Sharlene Dory, Nurse Practitioner with [facility] [place] [phone]), Scheduled on 08/20/2023 at 3:00 PM. Encouraged Attendance at Follow-Up Appointment with Dr. Marton Redwood, Audiologist with [facility] Outpatient Audiology [phone]), Scheduled on 08/25/2023 at 2:30 PM.      Our next appointment is by telephone on 08/19/2023 at 10:30 am.  Please call the care guide team at [phone] if you need to cancel or reschedule your appointment.   If you are experiencing a Mental Health or Behavioral Health Crisis or need someone to talk to, please call the Suicide and Crisis Lifeline: 988 call the [place] National Suicide Prevention Lifeline: 949 766 5658 or TTY: 2697304258 TTY 463-644-8286) to talk to a trained counselor call 1-800-273-TALK (toll free, 24 hour hotline) go to [facility] [address], [place] [phone]) call the [facility] Crisis Line: 812-171-0203 call  911  Patient verbalizes understanding of instructions and care plan provided today and agrees to view in MyChart. Active MyChart status and patient understanding of how to access instructions and care plan via MyChart confirmed with patient.     Telephone follow up appointment with care management team member scheduled for:  08/19/2023 at 10:30 am.  Danford Bad, BSW, MSW, Printmaker Social Work Case Set designer Health  [facility], Population Health Direct Dial: [phone]  Fax: [phone] Email: [email or web address] Website: [email or web address]

## 2023-08-07 ENCOUNTER — Encounter (HOSPITAL_COMMUNITY): Payer: Self-pay

## 2023-08-07 ENCOUNTER — Ambulatory Visit (HOSPITAL_COMMUNITY): Admission: RE | Admit: 2023-08-07 | Payer: Medicare HMO | Source: Ambulatory Visit

## 2023-08-11 DIAGNOSIS — M25559 Pain in unspecified hip: Secondary | ICD-10-CM | POA: Diagnosis not present

## 2023-08-11 DIAGNOSIS — I10 Essential (primary) hypertension: Secondary | ICD-10-CM | POA: Diagnosis not present

## 2023-08-11 DIAGNOSIS — F419 Anxiety disorder, unspecified: Secondary | ICD-10-CM | POA: Diagnosis not present

## 2023-08-11 DIAGNOSIS — Z6833 Body mass index (BMI) 33.0-33.9, adult: Secondary | ICD-10-CM | POA: Diagnosis not present

## 2023-08-11 DIAGNOSIS — Z79899 Other long term (current) drug therapy: Secondary | ICD-10-CM | POA: Diagnosis not present

## 2023-08-11 DIAGNOSIS — M51369 Other intervertebral disc degeneration, lumbar region without mention of lumbar back pain or lower extremity pain: Secondary | ICD-10-CM | POA: Diagnosis not present

## 2023-08-11 DIAGNOSIS — M539 Dorsopathy, unspecified: Secondary | ICD-10-CM | POA: Diagnosis not present

## 2023-08-19 ENCOUNTER — Ambulatory Visit (HOSPITAL_COMMUNITY)
Admission: RE | Admit: 2023-08-19 | Discharge: 2023-08-19 | Disposition: A | Discharge status: Home / Self Care | Payer: Medicare HMO | Source: Ambulatory Visit | Attending: Family Medicine | Admitting: Family Medicine

## 2023-08-19 ENCOUNTER — Inpatient Hospital Stay (HOSPITAL_COMMUNITY)
Admission: RE | Admit: 2023-08-19 | Discharge: 2023-08-19 | Discharge status: Home / Self Care | Payer: Medicare HMO | Source: Ambulatory Visit | Attending: Family Medicine

## 2023-08-19 ENCOUNTER — Ambulatory Visit: Payer: Self-pay | Admitting: *Deleted

## 2023-08-19 ENCOUNTER — Encounter (HOSPITAL_COMMUNITY): Payer: Self-pay | Admitting: Radiology

## 2023-08-19 DIAGNOSIS — I6523 Occlusion and stenosis of bilateral carotid arteries: Secondary | ICD-10-CM | POA: Diagnosis not present

## 2023-08-19 DIAGNOSIS — R634 Abnormal weight loss: Secondary | ICD-10-CM | POA: Diagnosis not present

## 2023-08-19 DIAGNOSIS — R0989 Other specified symptoms and signs involving the circulatory and respiratory systems: Secondary | ICD-10-CM | POA: Insufficient documentation

## 2023-08-19 DIAGNOSIS — N281 Cyst of kidney, acquired: Secondary | ICD-10-CM | POA: Diagnosis not present

## 2023-08-19 DIAGNOSIS — R52 Pain, unspecified: Secondary | ICD-10-CM | POA: Insufficient documentation

## 2023-08-19 DIAGNOSIS — K5730 Diverticulosis of large intestine without perforation or abscess without bleeding: Secondary | ICD-10-CM | POA: Diagnosis not present

## 2023-08-19 DIAGNOSIS — M47816 Spondylosis without myelopathy or radiculopathy, lumbar region: Secondary | ICD-10-CM | POA: Diagnosis not present

## 2023-08-19 DIAGNOSIS — M160 Bilateral primary osteoarthritis of hip: Secondary | ICD-10-CM | POA: Diagnosis not present

## 2023-08-19 DIAGNOSIS — I700 Atherosclerosis of aorta: Secondary | ICD-10-CM | POA: Diagnosis not present

## 2023-08-19 MED ORDER — IOHEXOL 300 MG/ML  SOLN
100.0000 mL | Freq: Once | INTRAMUSCULAR | Status: AC | PRN
  Administered 2023-08-19: 100 mL via INTRAVENOUS

## 2023-08-19 NOTE — Patient Instructions (Signed)
Visit Information  Thank you for taking time to visit with me today. Please don't hesitate to contact me if I can be of assistance to you.   Following are the goals we discussed today:   Goals Addressed               This Visit's Progress     Engineer, manufacturing systems, Services, & Resources. (pt-stated)   On track     Care Coordination Interventions:  Interventions Today    Flowsheet Row Most Recent Value  Chronic Disease   Chronic disease during today's visit Other  [Generalized Anxiety Disorder, Fatigue, Major Depressive Disorder, Caregiver Burnout, Stress, & Fatigue, Family Discord]  General Interventions   General Interventions Discussed/Reviewed General Interventions Discussed, General Interventions Reviewed, Communication with  [Communication with Primary Care Provider]  Education Interventions   Education Provided Provided Education  [Reviewed & Encouraged Use of Community Resources]  Provided Verbal Education On Mental Health/Coping with Illness, Community Resources  [employer] & Encouraged Use of Positive Coping Mechanisms]  Mental Health Interventions   Mental Health Discussed/Reviewed Mental Health Discussed, Anxiety, Depression, Grief and Loss, Mental Health Reviewed, Substance Abuse, Coping Strategies, Suicide, Other, Crisis  Teacher, English as a foreign language Burnout, Stress, & Fatigue]  Safety Interventions   Safety Discussed/Reviewed Safety Discussed, Safety Reviewed  [Encouraged Use of Assistive Devices]  Home Safety Assistive Devices  [Encouraged]      Active Listening & Reflection Utilized.  Verbalization of Feelings Encouraged.  Emotional Support Provided. Feelings of Caregiver Burnout, Stress, & Fatigue Acknowledged. Problem Solving Interventions Implemented  Solution-Focused Strategies Employed. Acceptance & Commitment Therapy Performed. Cognitive Behavioral Therapy Initiated. Encouraged Routine Engagement in Activities of Interest, Inside & Outside the Home. Encouraged  Administration of Medications, Exactly as Prescribed. Encouraged Increased Level of Activity & Exercise, as Tolerated. Encouraged Daily Implementation of Deep Breathing Exercises, Relaxation Techniques, & Mindfulness Meditation Strategies, in An Effort to Reduce & Manage Symptoms of Anxiety & Depression. Encouraged Journaling, As of Means of Expressing Thoughts, Feelings, & Emotions.  Encouraged Self-Enrollment with Psychiatrist of Interest in [facility], from List Provided, to Receive Psychotropic Medication Administration & Management, in An Effort to Reduce & Manage Symptoms of Anxiety & Depression. Encouraged Self-Enrollment with Therapist of Interest in [facility], from List Provided, to Receive Psychotherapeutic Counseling & Supportive Services, in An Effort to Reduce & Manage Symptoms of Anxiety & Depression. Encouraged Self-Enrollment with Caregiver Support Group of Interest in [facility], from List Provided, to [employer], Cytogeneticist, Resources, Education, Sales promotion account executive, Etc., in An Effort to Reduce & Manage Symptoms of Anxiety & Depression. Encouraged Delegation of Housing Responsibilities with 2 Sons, Currently Residing in The Home & Unemployed. Encouraged Engagement with [employer], Nurse, adult, & Resources of Interest in [facility] Offering Financial Assistance:            ~ [employer] [phone]# [phone]) ~ [employer] [phone]) ~ Lot [phone] (# 206-227-6188) ~ [facility] Department of Social Services                            [phone]# [phone]) ~ [employer] of [place] 336-814-1970) ~ [employer] 2067984585# [phone]) ~ [employer] 902-331-6372) ~ House of [place] (# 763-793-4467) ~ [employer] (# 925-671-1134) ~ Mahalia Longest 603-204-7307) ~ Aging, Disability, & Transit Services of [place]                            (#  515-337-3554) ~ Energy Program 403-807-1000) ~ Low Income  Energy Assistance Program 386-117-5072# (806)560-6098) ~ Venia [employer] 408-017-2778)  Encouraged Consideration of Applying for Food Stamps, Offering Assistance with Application Completion & Submission to The [facility] Department of Health & [employer] [phone]), for Processing. Encouraged Consideration of Applying for Medicaid, Offering Assistance  with Application Completion & Submission to The [facility] of Social Services [phone]), for Processing. Encouraged Consideration of Applying for Personal Care Services, Offering Assistance with Application Completion & Submission to [facility] [phone]), for Processing. Encouraged Attendance at Follow-Up Appointment with Sharlene Dory, Nurse Practitioner with [facility] [place] [phone]), Scheduled on 08/20/2023 at 3:00 PM. Encouraged Attendance at Follow-Up Appointment with Dr. Marton Redwood, Audiologist with [facility] Outpatient Audiology [phone]), Scheduled on 08/25/2023 at 2:30 PM. Encouraged Engagement with Danford Bad, Social Work Case Manager with [facility] [phone]), if You Have Questions, Need Assistance, or If Additional Social Work Needs Are Identified Between Now & Our Next Follow-Up Outreach Call, Scheduled on 09/18/2023 at 10:30 AM.       Our next appointment is by telephone on 09/18/2023 at 10:30 am.  Please call the care guide team at [phone] if you need to cancel or reschedule your appointment.   If you are experiencing a Mental Health or Behavioral Health Crisis or need someone to talk to, please call the Suicide and Crisis Lifeline: 988 call the [place] National Suicide Prevention Lifeline: 718-411-5900 or TTY: 575 714 2242 TTY 412-540-7536) to talk to a trained counselor call 1-800-273-TALK (toll free, 24 hour hotline) go to [facility] [address] [place] [phone]) call the  [facility] Crisis Line: ([phone] call 911  Patient verbalizes understanding of instructions and care plan provided today and agrees to view in MyChart. Active MyChart status and patient understanding of how to access instructions and care plan via MyChart confirmed with patient.     Telephone follow up appointment with care management team member scheduled for:  09/18/2023 at 10:30 am.  Danford Bad, BSW, MSW, LCSW  Embedded Practice Social Work Case Manager  [facility], Population Health Direct Dial: [phone]  Fax: [phone] Email: [email or web address] Website: [email or web address]

## 2023-08-19 NOTE — Patient Outreach (Signed)
Care Coordination   Follow Up Visit Note   08/19/2023  Name: [name] MRN: [id number] DOB: 05/21/1956  [name] is a 67 y.o. year old female who sees Margo Aye, Kathleene Hazel, MD for primary care. I spoke with [name] by phone today.  What matters to the patients health and wellness today?  Engineer, manufacturing systems, Nurse, adult, [employer].    Goals Addressed               This Visit's Progress     Engineer, manufacturing systems, Services, & Resources. (pt-stated)   On track     Care Coordination Interventions:  Interventions Today    Flowsheet Row Most Recent Value  Chronic Disease   Chronic disease during today's visit Other  [Generalized Anxiety Disorder, Fatigue, Major Depressive Disorder, Caregiver Burnout, Stress, & Fatigue, Family Discord]  General Interventions   General Interventions Discussed/Reviewed General Interventions Discussed, General Interventions Reviewed, Communication with  [Communication with Primary Care Provider]  Education Interventions   Education Provided Provided Education  [Reviewed & Encouraged Use of Community Resources]  Provided Verbal Education On Mental Health/Coping with Illness, Community Resources  [employer] & Encouraged Use of Positive Coping Mechanisms]  Mental Health Interventions   Mental Health Discussed/Reviewed Mental Health Discussed, Anxiety, Depression, Grief and Loss, Mental Health Reviewed, Substance Abuse, Coping Strategies, Suicide, Other, Crisis  Teacher, English as a foreign language Burnout, Stress, & Fatigue]  Safety Interventions   Safety Discussed/Reviewed Safety Discussed, Safety Reviewed  [Encouraged Use of Assistive Devices]  Home Safety Assistive Devices  [Encouraged]      Active Listening & Reflection Utilized.  Verbalization of Feelings Encouraged.  Emotional Support Provided. Feelings of Caregiver Burnout, Stress, & Fatigue Acknowledged. Problem Solving Interventions Implemented  Solution-Focused Strategies Employed. Acceptance &  Commitment Therapy Performed. Cognitive Behavioral Therapy Initiated. Encouraged Routine Engagement in Activities of Interest, Inside & Outside the Home. Encouraged Administration of Medications, Exactly as Prescribed. Encouraged Increased Level of Activity & Exercise, as Tolerated. Encouraged Daily Implementation of Deep Breathing Exercises, Relaxation Techniques, & Mindfulness Meditation Strategies, in An Effort to Reduce & Manage Symptoms of Anxiety & Depression. Encouraged Journaling, As of Means of Expressing Thoughts, Feelings, & Emotions.  Encouraged Self-Enrollment with Psychiatrist of Interest in [facility], from List Provided, to Receive Psychotropic Medication Administration & Management, in An Effort to Reduce & Manage Symptoms of Anxiety & Depression. Encouraged Self-Enrollment with Therapist of Interest in [facility], from List Provided, to Receive Psychotherapeutic Counseling & Supportive Services, in An Effort to Reduce & Manage Symptoms of Anxiety & Depression. Encouraged Self-Enrollment with Caregiver Support Group of Interest in [facility], from List Provided, to [employer], Cytogeneticist, Resources, Education, Sales promotion account executive, Etc., in An Effort to Reduce & Manage Symptoms of Anxiety & Depression. Encouraged Delegation of Housing Responsibilities with 2 Sons, Currently Residing in The Home & Unemployed. Encouraged Engagement with [employer], Nurse, adult, & Resources of Interest in [facility] Offering Financial Assistance:            ~ [employer] [phone]# 937-342-6770) ~ [employer] 3807260250) ~ Lot 838-632-1673 (# (814) [phone]) ~ [facility] Department of Social Services                            [phone]# (917)[phone]) ~ [employer] of [place] 212-814-5748) ~ [employer] (769)791-0090# (808) [phone]) ~ [employer] 929 501 2364) ~ [facility] (# 450-667-6353) ~ [employer] (# 423-882-9222) ~ Emilio [place] 959 739 1122) ~  Aging, Disability, & Transit Services of [place]                            (# (469) [phone]) ~ Energy Program 762-181-7531) ~ Low Income Energy Assistance Program 954-158-7160# 410-290-0169) ~ Venia Carbon 970-558-9351)  Encouraged Consideration of Applying for Food Stamps, Offering Assistance with Application Completion & Submission to [facility] Department of Health & [employer] (858)792-2310), for Processing. Encouraged Consideration of Applying for Medicaid, Offering Assistance  with Application Completion & Submission to [facility] of Social Services [phone]), for Processing. Encouraged Consideration of Applying for Personal Care Services, Offering Assistance with Application Completion & Submission to [facility] [phone]), for Processing. Encouraged Attendance at Follow-Up Appointment with Sharlene Dory, Nurse Practitioner with [facility] [phone]), Scheduled on 08/20/2023 at 3:00 PM. Encouraged Attendance at Follow-Up Appointment with Dr. Marton Redwood, Audiologist with [facility] [phone]), Scheduled on 08/25/2023 at 2:30 PM. Encouraged Engagement with Danford Bad, Social Work Case Manager with [facility] [phone]), if You Have Questions, Need Assistance, or If Additional Social Work Needs Are Identified Between Now & Our Next Follow-Up Outreach Call, Scheduled on 09/18/2023 at 10:30 AM.       SDOH assessments and interventions completed:  Yes.  Care Coordination Interventions:  Yes, provided.   Follow up plan: Follow up call scheduled for 09/18/2023 at 10:30 am.  Encounter Outcome:  Patient Visit Completed.   Danford Bad, BSW, MSW, Printmaker Social Work Case Set designer Health  [facility], [facility] Direct Dial: [phone]  Fax: [phone] Email:  [email or web address]: [email or web address]

## 2023-08-20 ENCOUNTER — Ambulatory Visit: Payer: Medicare HMO | Attending: Nurse Practitioner | Admitting: Nurse Practitioner

## 2023-08-20 ENCOUNTER — Encounter: Payer: Self-pay | Admitting: Nurse Practitioner

## 2023-08-20 VITALS — BP 159/77 | HR 59 | Ht 65.0 in | Wt 189.0 lb

## 2023-08-20 DIAGNOSIS — F439 Reaction to severe stress, unspecified: Secondary | ICD-10-CM

## 2023-08-20 DIAGNOSIS — R42 Dizziness and giddiness: Secondary | ICD-10-CM | POA: Diagnosis not present

## 2023-08-20 DIAGNOSIS — I25119 Atherosclerotic heart disease of native coronary artery with unspecified angina pectoris: Secondary | ICD-10-CM | POA: Diagnosis not present

## 2023-08-20 DIAGNOSIS — R0989 Other specified symptoms and signs involving the circulatory and respiratory systems: Secondary | ICD-10-CM | POA: Diagnosis not present

## 2023-08-20 DIAGNOSIS — I10 Essential (primary) hypertension: Secondary | ICD-10-CM | POA: Diagnosis not present

## 2023-08-20 DIAGNOSIS — Z79899 Other long term (current) drug therapy: Secondary | ICD-10-CM

## 2023-08-20 DIAGNOSIS — R0789 Other chest pain: Secondary | ICD-10-CM

## 2023-08-20 DIAGNOSIS — E785 Hyperlipidemia, unspecified: Secondary | ICD-10-CM

## 2023-08-20 DIAGNOSIS — R0609 Other forms of dyspnea: Secondary | ICD-10-CM

## 2023-08-20 DIAGNOSIS — N1831 Chronic kidney disease, stage 3a: Secondary | ICD-10-CM

## 2023-08-20 DIAGNOSIS — I6521 Occlusion and stenosis of right carotid artery: Secondary | ICD-10-CM | POA: Diagnosis not present

## 2023-08-20 MED ORDER — OLMESARTAN MEDOXOMIL 20 MG PO TABS: 20.0000 mg | ORAL_TABLET | Freq: Every day | ORAL | 1 refills | Status: AC

## 2023-08-20 MED ORDER — ROSUVASTATIN CALCIUM 10 MG PO TABS: 10.0000 mg | ORAL_TABLET | Freq: Every day | ORAL | 1 refills | Status: DC

## 2023-08-20 MED ORDER — ASPIRIN 81 MG PO TBEC: 81.0000 mg | DELAYED_RELEASE_TABLET | Freq: Every day | ORAL | Status: AC

## 2023-08-20 MED ORDER — NITROGLYCERIN 0.4 MG SL SUBL: 0.4000 mg | SUBLINGUAL_TABLET | SUBLINGUAL | 3 refills | Status: AC | PRN

## 2023-08-20 NOTE — Patient Instructions (Addendum)
Medication Instructions:  Your physician has recommended you make the following change in your medication:  Please Restart the following medications Aspirin 81 Mg daily  Olmesartan 20 Mg daily  Rosuvastatin 10 Mg daily  Nitroglycerin as needed  Labwork: In 1 week at [facility] In 2 months at [facility] FLP/LFT  Testing/Procedures: None  Follow-Up: Your physician recommends that you schedule a follow-up appointment in:  2-3 Months Peck 1-2 week Nurse visit BP check and bring monitor to have it calibrated   Any Other Special Instructions Will Be Listed Below (If Applicable).  If you need a refill on your cardiac medications before your next appointment, please call your pharmacy.

## 2023-08-20 NOTE — Progress Notes (Signed)
Office Visit    Patient Name: [name] Date of Encounter: 08/20/2023 PCP:  Benita Stabile, MD [facility]  Cardiologist:  Rollene Rotunda, MD  Advanced Practice Provider:  Sharlene Dory, NP Electrophysiologist:  None   Chief Complaint and HPI    [name] is a 67 y.o. female with a hx of CAD/chest pain, prior history of DVT of left lower extremity, hypertension, GERD, depression/anxiety, and chronic back pain, who presents today for scheduled follow-up.  Low risk NST in 2021.  Echocardiogram showed normal EF at that time.  Last seen for follow-up on February 03, 2023. Patient noted chest pressure, had been ongoing for 5-6 months. Attributed this to significant stress, denied any alleviating or aggravating factors. Stated she noticed that symptoms would wake her up at night. Lasted around 30 minutes in duration, stable over time. Had recently lost grandchild. Noted DOE and occasional palpitations. Palpitations lasted 10 minutes in duration, couple times per week. Denied any other associated symptoms. Denied any syncope, presyncope, dizziness, orthopnea, PND, swelling or significant weight changes, acute bleeding, or claudication. NST 02/2023 was negative for ischemia or infarction. Monitor report noted below. Had a mechanical fall 03/17/2023, went to ED, workup unremarkable for fracture.   Today she presents for follow-up. Patient is unsure of her medications. Pharmacy was called and medications were verified. Not on antihypertensive regimen, BP is elevated. Does not check her BP at home. Admits to dizziness at times with standing up. Does have occasional chest pressure, similar to previous presentation. Patient attributes this to stress. Denies any shortness of breath, palpitations, syncope, presyncope, orthopnea, PND, swelling or significant weight changes, acute bleeding, or claudication.   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 Grandchild passed away recently.   EKGs/Labs/Other Studies Reviewed:   The following studies were reviewed today:   EKG:  EKG is not ordered today.    Echo 02/2023:  1. Left ventricular ejection fraction, by estimation, is 65 to 70%. The left ventricle has normal function. The left ventricle has no regional wall motion abnormalities. There is mild left ventricular hypertrophy. Left ventricular diastolic parameters  are indeterminate.   2. Right ventricular systolic function is normal. The right ventricular size is normal.   3. Left atrial size was moderately dilated.   4. The mitral valve is abnormal. Mild mitral valve regurgitation. No  evidence of mitral stenosis. Severe mitral annular calcification.   5. The tricuspid valve is abnormal. Tricuspid valve regurgitation is mild to moderate.   6. The aortic valve is tricuspid. There is mild calcification of the  aortic valve. Aortic valve regurgitation is not visualized. Mild aortic  valve stenosis. Aortic valve mean gradient measures 13.0 mmHg. Aortic valve Vmax measures 2.30 m/s.   7. Increased flow velocities may be secondary to anemia, thyrotoxicosis, hyperdynamic or high flow state.   Comparison(s): Changes from prior study are noted. Mild AS and Mild MR (compared to mild to moderate previously) now.  Carotid doppler 02/2023:  Right carotid: Velocities in the right ICA are consistent with 1-39% stenosis. Left carotid: The extracranial vessels were near normal with only minimal wall thickening or plaque. Vertebrals: Bilateral vertebral arteries demonstrate antegrade flow. Subclavian's: Normal flow hemodynamics are seen in bilateral subclavian arteries.  Monitor 02/2023:  Predominant rhythm was normal sinus.  1 run of NSVT.  Occasional runs of SVT, longest lasting 8 beats.  No sustained arrhythmias.  Lexiscan 02/2023:  Stress ECG  is negative for ischemia.  Frequent PVCs are noted in stress and recovery. LV perfusion is normal.   There is no evidence of ischemia.  There is no evidence of infarction. Left ventricular function is normal.  Nuclear stress EF: 63%. The study is normal.  The study is low risk.  Myoview 05/2020: No diagnostic ST segment changes to indicate ischemia. No significant myocardial perfusion defects to indicate scar or ischemia. This is a low risk study. Nuclear stress EF: 72%.  Echo 05/2020:  1. Left ventricular ejection fraction, by estimation, is 60 to 65%. The  left ventricle has normal function. The left ventricle has no regional  wall motion abnormalities. There is mild left ventricular hypertrophy.  Left ventricular diastolic parameters  are consistent with Grade I diastolic dysfunction (impaired relaxation).   2. Right ventricular systolic function is normal. The right ventricular  size is normal. There is normal pulmonary artery systolic pressure. The  estimated right ventricular systolic pressure is 28.6 mmHg.   3. Left atrial size was moderately dilated.   4. The mitral valve is abnormal, mildly thickened and with moderate  annular calcification. Mild to moderate mitral valve regurgitation.   5. The aortic valve is tricuspid. Aortic valve regurgitation is not  visualized. Mild to moderate aortic valve sclerosis/calcification is  present, without any evidence of aortic stenosis. Aortic valve mean  gradient measures 7.0 mmHg.   6. The inferior vena cava is normal in size with greater than 50%  respiratory variability, suggesting right atrial pressure of 3 mmHg.  Review of Systems    All other systems reviewed and are otherwise negative except as noted above.  Physical Exam    VS:  BP (!) 159/77 (BP Location: Left Arm, Patient Position: Sitting, Cuff Size: Normal)   Pulse (!) 59   Ht 5\' 5"  (1.651 m)   Wt 189 lb (85.7 kg)   SpO2 97%   BMI 31.45 kg/m  , BMI Body mass index is 31.45 kg/m.  Wt Readings from Last 3 Encounters:  08/20/23 189 lb (85.7 kg)  02/03/23 206 lb (93.4  kg)  07/10/20 213 lb (96.6 kg)     GEN: Obese, 67 y.o. female in no acute distress. HEENT: normal. Neck: Supple, no JVD, faint right carotid bruit, no L carotid bruit, no masses. Cardiac: S1/S2, RRR, no murmurs, rubs, or gallops. No clubbing, no cyanosis. Nonpitting, lower extremity edema bilaterally. Radials/PT 2+ and equal bilaterally.  Respiratory:  Respirations regular and unlabored, clear to auscultation bilaterally. MS: No deformity or atrophy. Skin: Warm and dry, no rash. Neuro:  Strength and sensation are intact. Psych: Normal affect.  Assessment & Plan    Atypical chest pain, CAD Etiology noncardiac in nature. NST 02/2023 low risk, normal study. Etiology felt to be related to stress, patient also attributes symptoms to this. Will restart Aspirin 81 mg daily. Will restart olmesartan at 20 mg daily and rosuvastatin 10 mg daily.  Will prescribe nitroglycerin as needed. Heart healthy diet and regular cardiovascular exercise encouraged.  Care and ED precautions discussed.   3. Right carotid bruit, carotid artery stenosis Carotid doppler 02/2023 revealed 1-39% R ICA stenosis, left ICA near normal.  Restarting aspirin 81 mg daily as well as rosuvastatin 10 mg daily. Heart healthy diet and regular cardiovascular exercise encouraged.  Patient has upcoming carotid Doppler pending.  In 2 months, will obtain FLP and LFT per protocol.  4. HTN BP not at goal d/t not being on BP medications.  Does not check BP at  home.  Will restart olmesartan 20 mg daily. Discussed to monitor BP at home at least 2 hours after medications and sitting for 5-10 minutes. Heart healthy diet and regular cardiovascular exercise encouraged.  Will obtain BMET in 1 week.  5.  Orthostatic dizziness Ongoing for last several months. Orthostatics negative today.  Discussed conservative measures as well as care and ED precautions.  She verbalized understanding. Continue to follow with PCP.  6. HLD, medication management Not  on statin. Will restart rosuvastatin 10 mg daily and will recheck FLP/LFT in 2 months. Heart healthy diet and regular cardiovascular exercise encouraged.   7. Stress Admits to significant stress. Denies any SI/HI.  Has previously been followed by LCSW regarding this.  Heart healthy diet and regular cardiovascular exercise encouraged. Continue to follow with PCP.   Disposition: Follow up in 2-3 months with Rollene Rotunda, MD or APP.  Signed, Sharlene Dory, NP 08/23/2023, 4:47 PM [facility]

## 2023-08-21 DIAGNOSIS — Z23 Encounter for immunization: Secondary | ICD-10-CM | POA: Diagnosis not present

## 2023-08-23 ENCOUNTER — Encounter: Payer: Self-pay | Admitting: Nurse Practitioner

## 2023-08-25 ENCOUNTER — Ambulatory Visit: Payer: Medicare HMO | Attending: Family Medicine | Admitting: Audiology

## 2023-08-25 DIAGNOSIS — H903 Sensorineural hearing loss, bilateral: Secondary | ICD-10-CM | POA: Diagnosis not present

## 2023-08-25 NOTE — Procedures (Signed)
Outpatient Audiology and [facility] [address] [place], [place]  [address] [phone]  AUDIOLOGICAL  EVALUATION  NAME: [name]     DOB:   [birth year]      MRN: [id number]                                                                                     DATE: 08/25/2023     REFERENT: Benita Stabile, MD STATUS: Outpatient DIAGNOSIS: sensorineural hearing loss, bilateral   History: [name] was seen for an audiological evaluation due to decreased hearing occurring for many years.  [name] reports she was evaluated a few years ago and received Medicaid hearing aids.  [name] reports she no longer has Medicaid and has lost her hearing aids in the meantime.  [name] denies otalgia, aural fullness, and dizziness.  She reports constant bilateral tinnitus.  [name] reports increased difficulty hearing and communicating at work.  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  Evaluation:  Otoscopy showed a clear view of the tympanic membranes, bilaterally Tympanometry results were consistent with normal middle ear pressure and normal tympanic membrane mobility (Type A), bilaterally Audiometric testing was completed using Conventional Audiometry techniques with insert earphones and TDH headphones. Test results are consistent with a moderate to severe sensorineural hearing loss in the right ear and a moderate to profound sensorineural hearing loss in the left ear.  Asymmetric sensorineural hearing loss noted at 1500 Hz to 8000 Hz, worse in the left ear. Speech Recognition Thresholds were obtained at 70 dB HL in the right ear and at 80 dB HL in the left ear. Word Recognition Testing was completed at 90 dB HL and [name] scored 84% in the right ear and 12% in the left ear.  Asymmetric word recognition noted.  Results:  The test results were reviewed with [name]. Test results are consistent with a moderate to severe sensorineural  hearing loss in the right ear and a moderate to profound sensorineural hearing loss in the left ear.  Asymmetric sensorineural hearing loss noted at 1500 Hz to 8000 Hz, worse in the left ear. Asymmetric word recognition was noted. [name] will have hearing and communication difficulty in all listening environments she will benefit from the use of good communication strategies and amplification.  A referral to an ENT was recommended due to asymmetric hearing loss. [name] was given information on hearing aid providers in the [place] area.  During the counseling session held a also utilized a personal amplification device  (pocket talker) and reported benefit.  [name] was given information on where to buy a pocket talker.  Recommendations: 1.   Referral to an ENT for further evaluation of asymmetric sensorineural hearing loss 2.   Communication needs assessment with an audiologist to discuss amplification if motivated in pursuing hearing aids 3.    Use of pocket talker   30 minutes spent testing and counseling on results.   If you have any questions please feel free to contact me at [phone].  Marton Redwood Audiologist, Au.D., CCC-A 08/25/2023  3:15 PM  Cc: Benita Stabile, MD

## 2023-09-02 ENCOUNTER — Ambulatory Visit: Payer: Medicare HMO | Attending: Internal Medicine

## 2023-09-02 DIAGNOSIS — I10 Essential (primary) hypertension: Secondary | ICD-10-CM | POA: Diagnosis not present

## 2023-09-02 MED ORDER — AMLODIPINE BESYLATE 2.5 MG PO TABS: 2.5000 mg | ORAL_TABLET | Freq: Every day | ORAL | 4 refills | Status: AC

## 2023-09-02 NOTE — Progress Notes (Signed)
Patient here for BP check she did not bring her monitor today  Patient brother just passed away and is extremely emotional   Patient BP today-170/100-left 170/100-right  HR-58 SPO2-97  BP recheck 10 minutes later  158/88- Both arms   Spoke with E.Peck she states have the patient to have labs done tomorrow and bring back BP log to next visit with her   Will recheck BP again in 10 minutes  160/90-Both Arms

## 2023-09-02 NOTE — Patient Instructions (Addendum)
Medication Instructions:  Your physician has recommended you make the following change in your medication:  Please start Amlodipine 2.5 Mg daily   Labwork: Tomorrow at [facility]    Testing/Procedures: None   Follow-Up: Your physician recommends that you schedule a follow-up appointment in:  Nurse visit in 2 weeks in the afternoon  10/19/23 Philis Nettle @3PM    Any Other Special Instructions Will Be Listed Below (If Applicable).  If you need a refill on your cardiac medications before your next appointment, please call your pharmacy.

## 2023-09-08 DIAGNOSIS — M51369 Other intervertebral disc degeneration, lumbar region without mention of lumbar back pain or lower extremity pain: Secondary | ICD-10-CM | POA: Diagnosis not present

## 2023-09-08 DIAGNOSIS — M25559 Pain in unspecified hip: Secondary | ICD-10-CM | POA: Diagnosis not present

## 2023-09-08 DIAGNOSIS — Z79899 Other long term (current) drug therapy: Secondary | ICD-10-CM | POA: Diagnosis not present

## 2023-09-08 DIAGNOSIS — F419 Anxiety disorder, unspecified: Secondary | ICD-10-CM | POA: Diagnosis not present

## 2023-09-08 DIAGNOSIS — Z6833 Body mass index (BMI) 33.0-33.9, adult: Secondary | ICD-10-CM | POA: Diagnosis not present

## 2023-09-08 DIAGNOSIS — I10 Essential (primary) hypertension: Secondary | ICD-10-CM | POA: Diagnosis not present

## 2023-09-08 DIAGNOSIS — M539 Dorsopathy, unspecified: Secondary | ICD-10-CM | POA: Diagnosis not present

## 2023-09-10 NOTE — Progress Notes (Signed)
Nurse visit on 09/02/2023 - BP elevated and not at goal and presented elevated BP readings by CMA. Patient also endorses recent stressors to CMA. Recommended getting labwork obtained and gave verbal order to start Amlodipine 2.5 mg daily.   Sharlene Dory, NP

## 2023-09-16 ENCOUNTER — Ambulatory Visit: Payer: Medicare HMO | Attending: Cardiology

## 2023-09-18 ENCOUNTER — Ambulatory Visit: Payer: Self-pay | Admitting: *Deleted

## 2023-09-18 NOTE — Patient Instructions (Signed)
Visit Information  Thank you for taking time to visit with me today. Please don't hesitate to contact me if I can be of assistance to you.   Following are the goals we discussed today:   Goals Addressed               This Visit's Progress     COMPLETED: Engineer, manufacturing systems, Wellsite geologist. (pt-stated)   On track     Care Coordination Interventions:  Interventions Today    Flowsheet Row Most Recent Value  Chronic Disease   Chronic disease during today's visit Hypertension (HTN), Other, Chronic Kidney Disease/End Stage Renal Disease (ESRD)  [Generalized Anxiety Disorder, Fatigue, Major Depressive Disorder, Caregiver Burnout, Stress & Fatigue, Family Discord, Dysphagia, Chronic Pain Disorder]  General Interventions   General Interventions Discussed/Reviewed General Interventions Discussed, Doctor Visits, Communication with, [employer], Horticulturist, commercial (DME), General Interventions Reviewed  [Communication with Care Team Members]  Doctor Visits Discussed/Reviewed Doctor Visits Discussed, Specialist, Doctor Visits Reviewed, Annual Wellness Visits, PCP  [Encouraged Engagement]  Horticulturist, commercial (DME) Bed side commode, BP Cuff, Glucomoter, Insulin Pump, Oxygen, Environmental consultant, Wheelchair, Tour manager, Other  [Prescription Eyeglasses, Sports administrator, Manufacturing systems engineer, Raised Engineer, civil (consulting), Engineer, materials in [employer] Standard  PCP/Specialist Visits Compliance with follow-up visit  [Encouraged Engagement]  Communication with PCP/Specialists, Charity fundraiser, Pharmacists, Social Work  [employer] Engagement]  Exercise Interventions   Exercise Discussed/Reviewed Exercise Discussed, Education administrator use and maintanence, Exercise Reviewed, Physical Activity, Weight Managment  [Encouraged]  Physical Activity Discussed/Reviewed Physical Activity Discussed, Home Exercise Program (HEP), PREP, Physical Activity Reviewed, Gym, Types of exercise  [Encouraged]  Weight Management Weight  loss  Education Interventions   Education Provided Provided Education  [Encouraged]  Provided Verbal Education On Nutrition, Mental Health/Coping with Illness, When to see the doctor, Medication, Programmer, applications, Exercise  [Encouraged]  Mental Health Interventions   Mental Health Discussed/Reviewed Mental Health Discussed, Anxiety, Depression, Grief and Loss, Mental Health Reviewed, Substance Abuse, Coping Strategies, Suicide, Crisis  [Assessed]  Nutrition Interventions   Nutrition Discussed/Reviewed Nutrition Discussed, Nutrition Reviewed, Carbohydrate meal planning, Supplemental nutrition, Decreasing sugar intake, Portion sizes, Decreasing salt, Fluid intake, Increasing proteins, Adding fruits and vegetables, Decreasing fats  [Encouraged]  Pharmacy Interventions   Pharmacy Dicussed/Reviewed Pharmacy Topics Discussed, Medications and their functions, Medication Adherence, Pharmacy Topics Reviewed, Affording Medications  [Encouraged]  Medication Adherence --  [Medication Compliant]  Safety Interventions   Safety Discussed/Reviewed Safety Discussed, Safety Reviewed, Fall Risk, Home Safety  [Encouraged]  Home Engineer, water, Refer for community resources  [Encouraged]  Advanced Directive Interventions   Advanced Directives Discussed/Reviewed Advanced Directives Discussed  [Encouraged Initiation]      Active Listening & Reflection Utilized.  Verbalization of Feelings Encouraged.  Emotional Support Provided. Problem Solving Interventions Activated.  Solution-Focused Strategies Implemented. Acceptance & Commitment Therapy Initiated. Cognitive Behavioral Therapy Indicated. Encouraged Engagement with [employer], [employer] of Interest in [facility] [employer], from List Provided:  ~ [employer] ([phone]# 380-295-8998) ~ [employer] 270 497 1271) ~ Lot 910-513-1982 (# 989-328-6598) ~ [facility] Department of Social Services (618) [phone]#  (772)730-2877) ~ [employer] of [place] 3171942603) ~ Open Door [employer]ries ([phone]# 256-124-1148) ~ [employer] 6056013468) ~ [facility] [place] (# (631) 837-7998) ~ Help Incorporated (# 9286675630) ~ [place] Longest (831)207-6678) ~ Aging, Disability, & Transit Services of [place]     (# (854) 417-1753) ~ Energy Program (205)446-2576) ~ Low Income Energy Assistance Program 918-522-2428# (215)133-6756) ~ Venia Carbon (#  4320555794)  Encouraged Engagement with Danford Bad, Social Work Case Manager with [facility] [phone]), if You Have Questions, Need Assistance, or If Additional Social Work Needs Are Identified in The Near Future.        Please call the care guide team at [phone] if you need to cancel or reschedule your appointment.   If you are experiencing a Mental Health or Behavioral Health Crisis or need someone to talk to, please call the Suicide and Crisis Lifeline: 988 call the [place] National Suicide Prevention Lifeline: (223)514-8925 or TTY: 906-869-1361 TTY (928)208-3478) to talk to a trained counselor call 1-800-273-TALK (toll free, 24 hour hotline) go to [facility] [address] [place] [phone]) call the [facility] Crisis Line: [phone] call 911  Patient verbalizes understanding of instructions and care plan provided today and agrees to view in MyChart. Active MyChart status and patient understanding of how to access instructions and care plan via MyChart confirmed with patient.     No further follow up required.  Danford Bad, BSW, MSW, Printmaker Social Work Case Set designer Health  [facility], Population Health Direct Dial: [phone]  Fax: [phone] Email: [email or web address] Website: [email or web address]

## 2023-09-18 NOTE — Patient Outreach (Addendum)
Care Coordination   Follow Up Visit Note   09/18/2023  Name: [name] MRN: [id number] DOB: [birth year]  [name] is a 67 y.o. year old female who sees [name], Jasmine Hazel, MD for primary care. I spoke with [name] by phone today.  What matters to the patients health and wellness today?  Engineer, manufacturing systems, Wellsite geologist.    Goals Addressed               This Visit's Progress     Engineer, manufacturing systems, Services & Resources. (pt-stated)   On track     Care Coordination Interventions:  Interventions Today    Flowsheet Row Most Recent Value  Chronic Disease   Chronic disease during today's visit Hypertension (HTN), Other, Chronic Kidney Disease/End Stage Renal Disease (ESRD)  [Generalized Anxiety Disorder, Fatigue, Major Depressive Disorder, Caregiver Burnout, Stress & Fatigue, Family Discord, Dysphagia, Chronic Pain Disorder]  General Interventions   General Interventions Discussed/Reviewed General Interventions Discussed, Doctor Visits, Communication with, [employer], Horticulturist, commercial (DME), General Interventions Reviewed  [Communication with Care Team Members]  Doctor Visits Discussed/Reviewed Doctor Visits Discussed, Specialist, Doctor Visits Reviewed, Annual Wellness Visits, PCP  [Encouraged Engagement]  Horticulturist, commercial (DME) Bed side commode, BP Cuff, Glucomoter, Insulin Pump, Oxygen, Environmental consultant, Wheelchair, Tour manager, Other  [Prescription Eyeglasses, Sports administrator, Manufacturing systems engineer, Raised Engineer, civil (consulting), Engineer, materials in [employer] Standard  PCP/Specialist Visits Compliance with follow-up visit  [Encouraged Engagement]  Communication with PCP/Specialists, Charity fundraiser, Pharmacists, Social Work  [employer] Engagement]  Exercise Interventions   Exercise Discussed/Reviewed Exercise Discussed, Education administrator use and maintanence, Exercise Reviewed, Physical Activity, Weight Managment  [Encouraged]  Physical Activity Discussed/Reviewed  Physical Activity Discussed, Home Exercise Program (HEP), PREP, Physical Activity Reviewed, Gym, Types of exercise  [Encouraged]  Weight Management Weight loss  Education Interventions   Education Provided Provided Education  [Encouraged]  Provided Verbal Education On Nutrition, Mental Health/Coping with Illness, When to see the doctor, Medication, Programmer, applications, Exercise  [Encouraged]  Mental Health Interventions   Mental Health Discussed/Reviewed Mental Health Discussed, Anxiety, Depression, Grief and Loss, Mental Health Reviewed, Substance Abuse, Coping Strategies, Suicide, Crisis  [Assessed]  Nutrition Interventions   Nutrition Discussed/Reviewed Nutrition Discussed, Nutrition Reviewed, Carbohydrate meal planning, Supplemental nutrition, Decreasing sugar intake, Portion sizes, Decreasing salt, Fluid intake, Increasing proteins, Adding fruits and vegetables, Decreasing fats  [Encouraged]  Pharmacy Interventions   Pharmacy Dicussed/Reviewed Pharmacy Topics Discussed, Medications and their functions, Medication Adherence, Pharmacy Topics Reviewed, Affording Medications  [Encouraged]  Medication Adherence --  [Medication Compliant]  Safety Interventions   Safety Discussed/Reviewed Safety Discussed, Safety Reviewed, Fall Risk, Home Safety  [Encouraged]  Home Engineer, water, Refer for community resources  [Encouraged]  Advanced Directive Interventions   Advanced Directives Discussed/Reviewed Advanced Directives Discussed  [Encouraged Initiation]      Active Listening & Reflection Utilized.  Verbalization of Feelings Encouraged.  Emotional Support Provided. Problem Solving Interventions Activated.  Solution-Focused Strategies Implemented. Acceptance & Commitment Therapy Initiated. Cognitive Behavioral Therapy Indicated. Encouraged Engagement with [employer], [employer] of Interest in [facility] [employer]Star, from List Provided:  ~  [employer] Stores ([phone]# (680)475-4628) ~ [employer]ning 509-385-4559) ~ Lot 737 646 4977 (# (567)518-1097) ~ [facility] Department of Social Services ([phone]# 2266214941) ~ [employer] of [place] 506-690-3225) ~ Open Door Ministries 865-647-1481# 671-539-6102) ~ [employer] Stores (# 956-850-2934) ~ [facility] [place] (# 249 419 4049) ~ Help Incorporated (# 9196953166) ~ Mahalia Longest 6717250461) ~ Aging,  Disability & Transit Services of [place]     (# 352-638-3070) ~ Energy Program 984-381-3256) ~ Low Income Energy Assistance Program 431 747 9642# (484)780-6801) ~ Venia Carbon 732-658-9387)  Encouraged Engagement with [name], Social Work Case Manager with [facility] [phone]), if You Have Questions, Need Assistance, or If Additional Social Work Needs Are Identified in The Near Future.        SDOH assessments and interventions completed:  Yes.  Care Coordination Interventions:  Yes, provided.   Follow up plan: No further intervention required.   Encounter Outcome:  Patient Visit Completed.   [name], BSW, MSW, Printmaker Social Work Case Set designer Health  [facility], Population Health Direct Dial: [phone]  Fax: [phone] Email: [name] Website: [email or web address]

## 2023-10-06 DIAGNOSIS — Z79899 Other long term (current) drug therapy: Secondary | ICD-10-CM | POA: Diagnosis not present

## 2023-10-06 DIAGNOSIS — M25559 Pain in unspecified hip: Secondary | ICD-10-CM | POA: Diagnosis not present

## 2023-10-06 DIAGNOSIS — I10 Essential (primary) hypertension: Secondary | ICD-10-CM | POA: Diagnosis not present

## 2023-10-06 DIAGNOSIS — F419 Anxiety disorder, unspecified: Secondary | ICD-10-CM | POA: Diagnosis not present

## 2023-10-06 DIAGNOSIS — M51369 Other intervertebral disc degeneration, lumbar region without mention of lumbar back pain or lower extremity pain: Secondary | ICD-10-CM | POA: Diagnosis not present

## 2023-10-06 DIAGNOSIS — Z6833 Body mass index (BMI) 33.0-33.9, adult: Secondary | ICD-10-CM | POA: Diagnosis not present

## 2023-10-06 DIAGNOSIS — M539 Dorsopathy, unspecified: Secondary | ICD-10-CM | POA: Diagnosis not present

## 2023-10-13 DIAGNOSIS — Z01419 Encounter for gynecological examination (general) (routine) without abnormal findings: Secondary | ICD-10-CM | POA: Diagnosis not present

## 2023-10-19 ENCOUNTER — Ambulatory Visit: Payer: Medicare HMO | Admitting: Nurse Practitioner

## 2023-11-02 DIAGNOSIS — Z79899 Other long term (current) drug therapy: Secondary | ICD-10-CM | POA: Diagnosis not present

## 2023-11-02 DIAGNOSIS — M51369 Other intervertebral disc degeneration, lumbar region without mention of lumbar back pain or lower extremity pain: Secondary | ICD-10-CM | POA: Diagnosis not present

## 2023-11-02 DIAGNOSIS — I10 Essential (primary) hypertension: Secondary | ICD-10-CM | POA: Diagnosis not present

## 2023-11-02 DIAGNOSIS — M539 Dorsopathy, unspecified: Secondary | ICD-10-CM | POA: Diagnosis not present

## 2023-11-02 DIAGNOSIS — M25559 Pain in unspecified hip: Secondary | ICD-10-CM | POA: Diagnosis not present

## 2023-11-02 DIAGNOSIS — F419 Anxiety disorder, unspecified: Secondary | ICD-10-CM | POA: Diagnosis not present

## 2023-11-02 DIAGNOSIS — Z6833 Body mass index (BMI) 33.0-33.9, adult: Secondary | ICD-10-CM | POA: Diagnosis not present

## 2023-11-17 ENCOUNTER — Other Ambulatory Visit: Payer: Self-pay | Admitting: Family Medicine

## 2023-11-17 ENCOUNTER — Other Ambulatory Visit: Payer: Self-pay

## 2023-11-17 ENCOUNTER — Other Ambulatory Visit (HOSPITAL_COMMUNITY): Payer: Self-pay | Admitting: Family Medicine

## 2023-11-17 DIAGNOSIS — K219 Gastro-esophageal reflux disease without esophagitis: Secondary | ICD-10-CM | POA: Diagnosis not present

## 2023-11-17 DIAGNOSIS — I6523 Occlusion and stenosis of bilateral carotid arteries: Secondary | ICD-10-CM | POA: Diagnosis not present

## 2023-11-17 DIAGNOSIS — I4510 Unspecified right bundle-branch block: Secondary | ICD-10-CM | POA: Diagnosis not present

## 2023-11-17 DIAGNOSIS — Z90710 Acquired absence of both cervix and uterus: Secondary | ICD-10-CM | POA: Diagnosis not present

## 2023-11-17 DIAGNOSIS — R930 Abnormal findings on diagnostic imaging of skull and head, not elsewhere classified: Secondary | ICD-10-CM | POA: Diagnosis not present

## 2023-11-17 DIAGNOSIS — G8929 Other chronic pain: Secondary | ICD-10-CM | POA: Diagnosis not present

## 2023-11-17 DIAGNOSIS — I10 Essential (primary) hypertension: Secondary | ICD-10-CM | POA: Diagnosis not present

## 2023-11-17 DIAGNOSIS — Z7982 Long term (current) use of aspirin: Secondary | ICD-10-CM | POA: Diagnosis not present

## 2023-11-17 DIAGNOSIS — R600 Localized edema: Secondary | ICD-10-CM | POA: Diagnosis not present

## 2023-11-17 DIAGNOSIS — Z86718 Personal history of other venous thrombosis and embolism: Secondary | ICD-10-CM | POA: Diagnosis not present

## 2023-11-17 DIAGNOSIS — I6529 Occlusion and stenosis of unspecified carotid artery: Secondary | ICD-10-CM | POA: Diagnosis not present

## 2023-11-17 DIAGNOSIS — I6782 Cerebral ischemia: Secondary | ICD-10-CM | POA: Diagnosis not present

## 2023-11-17 DIAGNOSIS — F1120 Opioid dependence, uncomplicated: Secondary | ICD-10-CM | POA: Diagnosis not present

## 2023-11-17 DIAGNOSIS — Z79899 Other long term (current) drug therapy: Secondary | ICD-10-CM | POA: Diagnosis not present

## 2023-11-17 DIAGNOSIS — Z885 Allergy status to narcotic agent status: Secondary | ICD-10-CM | POA: Diagnosis not present

## 2023-11-17 DIAGNOSIS — M51369 Other intervertebral disc degeneration, lumbar region without mention of lumbar back pain or lower extremity pain: Secondary | ICD-10-CM | POA: Diagnosis not present

## 2023-11-17 DIAGNOSIS — R9082 White matter disease, unspecified: Secondary | ICD-10-CM | POA: Diagnosis not present

## 2023-11-17 DIAGNOSIS — M549 Dorsalgia, unspecified: Secondary | ICD-10-CM | POA: Diagnosis not present

## 2023-11-17 DIAGNOSIS — R519 Headache, unspecified: Secondary | ICD-10-CM | POA: Diagnosis not present

## 2023-11-17 DIAGNOSIS — G44209 Tension-type headache, unspecified, not intractable: Secondary | ICD-10-CM | POA: Diagnosis not present

## 2023-11-17 DIAGNOSIS — G309 Alzheimer's disease, unspecified: Secondary | ICD-10-CM | POA: Diagnosis not present

## 2023-11-17 DIAGNOSIS — J449 Chronic obstructive pulmonary disease, unspecified: Secondary | ICD-10-CM | POA: Diagnosis not present

## 2023-11-17 DIAGNOSIS — R9431 Abnormal electrocardiogram [ECG] [EKG]: Secondary | ICD-10-CM | POA: Diagnosis not present

## 2023-11-17 DIAGNOSIS — M5030 Other cervical disc degeneration, unspecified cervical region: Secondary | ICD-10-CM | POA: Diagnosis not present

## 2023-11-18 DIAGNOSIS — R600 Localized edema: Secondary | ICD-10-CM | POA: Diagnosis not present

## 2023-11-18 DIAGNOSIS — F1190 Opioid use, unspecified, uncomplicated: Secondary | ICD-10-CM | POA: Diagnosis not present

## 2023-11-18 DIAGNOSIS — I10 Essential (primary) hypertension: Secondary | ICD-10-CM | POA: Diagnosis not present

## 2023-11-18 DIAGNOSIS — M5030 Other cervical disc degeneration, unspecified cervical region: Secondary | ICD-10-CM | POA: Diagnosis not present

## 2023-11-18 DIAGNOSIS — I2510 Atherosclerotic heart disease of native coronary artery without angina pectoris: Secondary | ICD-10-CM | POA: Diagnosis not present

## 2023-11-18 DIAGNOSIS — R930 Abnormal findings on diagnostic imaging of skull and head, not elsewhere classified: Secondary | ICD-10-CM | POA: Diagnosis not present

## 2023-11-18 DIAGNOSIS — Z86718 Personal history of other venous thrombosis and embolism: Secondary | ICD-10-CM | POA: Diagnosis not present

## 2023-11-30 DIAGNOSIS — M25559 Pain in unspecified hip: Secondary | ICD-10-CM | POA: Diagnosis not present

## 2023-11-30 DIAGNOSIS — Z6833 Body mass index (BMI) 33.0-33.9, adult: Secondary | ICD-10-CM | POA: Diagnosis not present

## 2023-11-30 DIAGNOSIS — Z79899 Other long term (current) drug therapy: Secondary | ICD-10-CM | POA: Diagnosis not present

## 2023-11-30 DIAGNOSIS — M539 Dorsopathy, unspecified: Secondary | ICD-10-CM | POA: Diagnosis not present

## 2023-11-30 DIAGNOSIS — F419 Anxiety disorder, unspecified: Secondary | ICD-10-CM | POA: Diagnosis not present

## 2023-11-30 DIAGNOSIS — I10 Essential (primary) hypertension: Secondary | ICD-10-CM | POA: Diagnosis not present

## 2023-11-30 DIAGNOSIS — M51369 Other intervertebral disc degeneration, lumbar region without mention of lumbar back pain or lower extremity pain: Secondary | ICD-10-CM | POA: Diagnosis not present

## 2023-12-02 DIAGNOSIS — G43909 Migraine, unspecified, not intractable, without status migrainosus: Secondary | ICD-10-CM | POA: Diagnosis not present

## 2023-12-02 DIAGNOSIS — Z8673 Personal history of transient ischemic attack (TIA), and cerebral infarction without residual deficits: Secondary | ICD-10-CM | POA: Diagnosis not present

## 2023-12-02 DIAGNOSIS — I10 Essential (primary) hypertension: Secondary | ICD-10-CM | POA: Diagnosis not present

## 2023-12-02 DIAGNOSIS — I2510 Atherosclerotic heart disease of native coronary artery without angina pectoris: Secondary | ICD-10-CM | POA: Diagnosis not present

## 2023-12-02 DIAGNOSIS — I6529 Occlusion and stenosis of unspecified carotid artery: Secondary | ICD-10-CM | POA: Diagnosis not present

## 2023-12-05 DIAGNOSIS — G44209 Tension-type headache, unspecified, not intractable: Secondary | ICD-10-CM | POA: Diagnosis not present

## 2023-12-06 DIAGNOSIS — B349 Viral infection, unspecified: Secondary | ICD-10-CM | POA: Diagnosis not present

## 2023-12-06 DIAGNOSIS — G8929 Other chronic pain: Secondary | ICD-10-CM | POA: Diagnosis not present

## 2023-12-06 DIAGNOSIS — K219 Gastro-esophageal reflux disease without esophagitis: Secondary | ICD-10-CM | POA: Diagnosis not present

## 2023-12-06 DIAGNOSIS — M549 Dorsalgia, unspecified: Secondary | ICD-10-CM | POA: Diagnosis not present

## 2023-12-06 DIAGNOSIS — Z79899 Other long term (current) drug therapy: Secondary | ICD-10-CM | POA: Diagnosis not present

## 2023-12-06 DIAGNOSIS — Z20822 Contact with and (suspected) exposure to covid-19: Secondary | ICD-10-CM | POA: Diagnosis not present

## 2023-12-06 DIAGNOSIS — I10 Essential (primary) hypertension: Secondary | ICD-10-CM | POA: Diagnosis not present

## 2023-12-06 DIAGNOSIS — Z1152 Encounter for screening for COVID-19: Secondary | ICD-10-CM | POA: Diagnosis not present

## 2023-12-06 DIAGNOSIS — Z7982 Long term (current) use of aspirin: Secondary | ICD-10-CM | POA: Diagnosis not present

## 2023-12-06 DIAGNOSIS — R059 Cough, unspecified: Secondary | ICD-10-CM | POA: Diagnosis not present

## 2023-12-10 DIAGNOSIS — I10 Essential (primary) hypertension: Secondary | ICD-10-CM | POA: Diagnosis not present

## 2023-12-10 DIAGNOSIS — I872 Venous insufficiency (chronic) (peripheral): Secondary | ICD-10-CM | POA: Diagnosis not present

## 2023-12-10 DIAGNOSIS — J069 Acute upper respiratory infection, unspecified: Secondary | ICD-10-CM | POA: Diagnosis not present

## 2023-12-10 DIAGNOSIS — K219 Gastro-esophageal reflux disease without esophagitis: Secondary | ICD-10-CM | POA: Diagnosis not present

## 2023-12-10 DIAGNOSIS — R600 Localized edema: Secondary | ICD-10-CM | POA: Diagnosis not present

## 2023-12-28 ENCOUNTER — Other Ambulatory Visit (HOSPITAL_COMMUNITY): Payer: Self-pay | Admitting: Internal Medicine

## 2023-12-28 DIAGNOSIS — I10 Essential (primary) hypertension: Secondary | ICD-10-CM | POA: Diagnosis not present

## 2023-12-28 DIAGNOSIS — I2510 Atherosclerotic heart disease of native coronary artery without angina pectoris: Secondary | ICD-10-CM | POA: Diagnosis not present

## 2023-12-28 DIAGNOSIS — G43909 Migraine, unspecified, not intractable, without status migrainosus: Secondary | ICD-10-CM | POA: Diagnosis not present

## 2023-12-28 DIAGNOSIS — E2839 Other primary ovarian failure: Secondary | ICD-10-CM | POA: Insufficient documentation

## 2023-12-28 DIAGNOSIS — I6523 Occlusion and stenosis of bilateral carotid arteries: Secondary | ICD-10-CM | POA: Diagnosis not present

## 2023-12-28 DIAGNOSIS — K219 Gastro-esophageal reflux disease without esophagitis: Secondary | ICD-10-CM | POA: Diagnosis not present

## 2023-12-28 DIAGNOSIS — D509 Iron deficiency anemia, unspecified: Secondary | ICD-10-CM | POA: Diagnosis not present

## 2023-12-28 DIAGNOSIS — R7301 Impaired fasting glucose: Secondary | ICD-10-CM | POA: Diagnosis not present

## 2023-12-28 DIAGNOSIS — E778 Other disorders of glycoprotein metabolism: Secondary | ICD-10-CM | POA: Insufficient documentation

## 2023-12-28 DIAGNOSIS — W19XXXD Unspecified fall, subsequent encounter: Secondary | ICD-10-CM

## 2023-12-28 DIAGNOSIS — I1310 Hypertensive heart and chronic kidney disease without heart failure, with stage 1 through stage 4 chronic kidney disease, or unspecified chronic kidney disease: Secondary | ICD-10-CM | POA: Diagnosis not present

## 2023-12-28 DIAGNOSIS — E539 Vitamin B deficiency, unspecified: Secondary | ICD-10-CM | POA: Diagnosis not present

## 2023-12-28 DIAGNOSIS — E669 Obesity, unspecified: Secondary | ICD-10-CM | POA: Diagnosis not present

## 2023-12-28 DIAGNOSIS — E538 Deficiency of other specified B group vitamins: Secondary | ICD-10-CM | POA: Diagnosis not present

## 2023-12-28 DIAGNOSIS — Z8673 Personal history of transient ischemic attack (TIA), and cerebral infarction without residual deficits: Secondary | ICD-10-CM | POA: Diagnosis not present

## 2023-12-28 DIAGNOSIS — R600 Localized edema: Secondary | ICD-10-CM | POA: Diagnosis not present

## 2023-12-28 DIAGNOSIS — N1831 Chronic kidney disease, stage 3a: Secondary | ICD-10-CM | POA: Diagnosis not present

## 2023-12-29 DIAGNOSIS — Z6833 Body mass index (BMI) 33.0-33.9, adult: Secondary | ICD-10-CM | POA: Diagnosis not present

## 2023-12-29 DIAGNOSIS — M25559 Pain in unspecified hip: Secondary | ICD-10-CM | POA: Diagnosis not present

## 2023-12-29 DIAGNOSIS — F419 Anxiety disorder, unspecified: Secondary | ICD-10-CM | POA: Diagnosis not present

## 2023-12-29 DIAGNOSIS — M539 Dorsopathy, unspecified: Secondary | ICD-10-CM | POA: Diagnosis not present

## 2023-12-29 DIAGNOSIS — I10 Essential (primary) hypertension: Secondary | ICD-10-CM | POA: Diagnosis not present

## 2023-12-29 DIAGNOSIS — M51369 Other intervertebral disc degeneration, lumbar region without mention of lumbar back pain or lower extremity pain: Secondary | ICD-10-CM | POA: Diagnosis not present

## 2023-12-29 DIAGNOSIS — Z79899 Other long term (current) drug therapy: Secondary | ICD-10-CM | POA: Diagnosis not present

## 2024-01-01 DIAGNOSIS — E538 Deficiency of other specified B group vitamins: Secondary | ICD-10-CM | POA: Insufficient documentation

## 2024-01-26 DIAGNOSIS — I10 Essential (primary) hypertension: Secondary | ICD-10-CM | POA: Diagnosis not present

## 2024-01-26 DIAGNOSIS — M25559 Pain in unspecified hip: Secondary | ICD-10-CM | POA: Diagnosis not present

## 2024-01-26 DIAGNOSIS — M51369 Other intervertebral disc degeneration, lumbar region without mention of lumbar back pain or lower extremity pain: Secondary | ICD-10-CM | POA: Diagnosis not present

## 2024-01-26 DIAGNOSIS — M539 Dorsopathy, unspecified: Secondary | ICD-10-CM | POA: Diagnosis not present

## 2024-01-26 DIAGNOSIS — Z79899 Other long term (current) drug therapy: Secondary | ICD-10-CM | POA: Diagnosis not present

## 2024-01-26 DIAGNOSIS — Z6830 Body mass index (BMI) 30.0-30.9, adult: Secondary | ICD-10-CM | POA: Diagnosis not present

## 2024-01-26 DIAGNOSIS — F419 Anxiety disorder, unspecified: Secondary | ICD-10-CM | POA: Diagnosis not present

## 2024-01-29 DIAGNOSIS — E870 Hyperosmolality and hypernatremia: Secondary | ICD-10-CM | POA: Insufficient documentation

## 2024-02-12 NOTE — Patient Instructions (Signed)
 Visit Information  Thank you for taking time to visit with me today. Please don't hesitate to contact me if I can be of assistance to you.   Following are the goals we discussed today:   Goals Addressed             This Visit's Progress    (insurance, finances, caregiver resources)-[facility] nurse care coordination service       Interventions Today    Flowsheet Row Most Recent Value  Chronic Disease   Chronic disease during today's visit Other  [Medicaid, home flooring]  General Interventions   General Interventions Discussed/Reviewed General Interventions Reviewed, Communication with, Community Resources  PCP/Specialist Visits Compliance with follow-up visit  Communication with PCP/Specialists  [Completed a conference call with patient to [place] [employer] in [place]. Provided her with the [facility] [place] information to offer to her son.  Provided her with an [place] food pantry for Wednesday & Thursday [phone] info]  Education Interventions   Education Provided Provided Education  Provided Verbal Education On [employer] christian ministry (food+), [employer]]              Our next appointment is by telephone on 07/16/23 at 4:15 pm  Please call the care guide team at [phone] if you need to cancel or reschedule your appointment.   If you are experiencing a Mental Health or Behavioral Health Crisis or need someone to talk to, please call the Suicide and Crisis Lifeline: 988 call the [place] National Suicide Prevention Lifeline: 952-072-6148 or TTY: 507-047-9904 TTY 747-771-8087) to talk to a trained counselor call 1-800-273-TALK (toll free, 24 hour hotline) call the [facility]: [phone] call 911   The patient verbalized understanding of instructions, educational materials, and care plan provided today and DECLINED offer to receive copy of patient instructions, educational materials, and  care plan.   The patient has been provided with contact information for the care management team and has been advised to call with any health related questions or concerns.   Milea Klink L. Noelle Penner, RN, BSN, CCM [facility] Health RN Care Manager [phone]

## 2024-02-16 ENCOUNTER — Encounter: Payer: Self-pay | Admitting: *Deleted

## 2024-02-16 ENCOUNTER — Ambulatory Visit: Payer: Self-pay | Admitting: *Deleted

## 2024-02-16 ENCOUNTER — Other Ambulatory Visit: Payer: Self-pay

## 2024-02-16 NOTE — Patient Instructions (Signed)
 Visit Information  Thank you for taking time to visit with me today. Please don't hesitate to contact me if I can be of assistance to you before our next scheduled appointment.  Your next care management appointment is by telephone on 03/15/24 at 3 pm  Referral to Care Guide.  Please call the care guide team at [phone] if you need to cancel, schedule, or reschedule an appointment.   Please call the Suicide and Crisis Lifeline: 988 call the [place] National Suicide Prevention Lifeline: (306) 380-3602 or TTY: 516-485-0772 TTY (352)277-9016) to talk to a trained counselor call 1-800-273-TALK (toll free, 24 hour hotline) call the [facility]: [phone] call 911 if you are experiencing a Mental Health or Behavioral Health Crisis or need someone to talk to.   [name] L. Noelle Penner, RN, BSN, CCM [place]  [facility], [facility] Health RN Care Manager Direct Dial: [phone]  Fax: [phone]
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
## 2024-02-18 ENCOUNTER — Other Ambulatory Visit: Payer: Self-pay | Admitting: Nurse Practitioner

## 2024-03-15 ENCOUNTER — Encounter: Payer: Self-pay | Admitting: *Deleted

## 2024-03-15 ENCOUNTER — Other Ambulatory Visit: Payer: Self-pay | Admitting: *Deleted

## 2024-03-15 NOTE — Patient Outreach (Signed)
 Error with opening duplicate note for unsuccessful outreach  Terrilee Dudzik L. Mcarthur Speedy, RN, BSN, CCM [place]  Value [facility] Care Institute, [facility] Health RN Care Manager Direct Dial: [phone]  Fax: [phone]

## 2024-04-08 ENCOUNTER — Telehealth: Payer: Self-pay | Admitting: *Deleted

## 2024-04-08 NOTE — Progress Notes (Signed)
 Complex Care Management Care Guide Note  04/08/2024 Name: [name] MRN: [id number] DOB: 10/04/1956  [name] is a 68 y.o. year old female who is a primary care patient of Del Favia, Lethia Raveling, MD and is actively engaged with the care management team. I reached out to Lataria M Kroft by phone today to assist with re-scheduling  with the RN Case Manager.  Follow up plan: Unsuccessful telephone outreach attempt made. A HIPAA compliant phone message was left for the patient providing contact information and requesting a return call.  Kandis Ormond, CMA [place]  [facility], [facility] Guide Direct Dial: [phone]  Fax: [phone] Website: [email or web address]

## 2024-04-15 NOTE — Progress Notes (Signed)
 Complex Care Management Care Guide Note  04/15/2024 Name: [name] MRN: [id number] DOB: 04-20-1956  [name] is a 68 y.o. year old female who is a primary care patient of Del Favia, Lethia Raveling, MD and is actively engaged with the care management team. I reached out to Tyrese M Dimmick by phone today to assist with re-scheduling  with the RN Case Manager.  Follow up plan: Unsuccessful telephone outreach attempt made. A HIPAA compliant phone message was left for the patient providing contact information and requesting a return call. No further outreach attempts will be made due to inability to maintain patient contact  Kandis Ormond, CMA [facility] Health  [facility], [facility] Guide Direct Dial: [phone]  Fax: [phone] Website: [email or web address]

## 2024-07-06 DIAGNOSIS — R4589 Other symptoms and signs involving emotional state: Secondary | ICD-10-CM | POA: Insufficient documentation

## 2024-07-06 DIAGNOSIS — J069 Acute upper respiratory infection, unspecified: Secondary | ICD-10-CM | POA: Insufficient documentation

## 2024-07-06 DIAGNOSIS — U071 COVID-19: Secondary | ICD-10-CM | POA: Insufficient documentation

## 2024-07-19 ENCOUNTER — Other Ambulatory Visit: Payer: Self-pay | Admitting: *Deleted

## 2024-07-19 ENCOUNTER — Telehealth: Payer: Self-pay
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
## 2024-07-19 NOTE — Patient Instructions (Signed)
 Visit Information  Thank you for taking time to visit with me today. Please don't hesitate to contact me if I can be of assistance to you before our next scheduled appointment.  Our next appointment is by telephone on 07/27/24 at 2pm  Please call the care guide team at [phone] if you need to cancel or reschedule your appointment.   Following is a copy of your care plan:   Goals Addressed             This Visit's Progress    BSW VBCI Social Work Care Plan       Problems:   Corporate treasurer  and Housing   CSW Clinical Goal(s):   Over the next 7 days the Patient will will follow up with mortgage company and Social Security as directed by Social Work.  Interventions:  Social Determinants of Health in Patient with CKD Stage 3 and HTN: SDOH assessments completed: Financial Strain  and Housing  Evaluation of current treatment plan related to unmet needs Patient has applied for widows pension but [employer] has not provided an update on when patient will receive her check.  Patient has returned to work, but does not have funds to pay the $440 mortgage for September.  DSS assisted with the light bill.  Patient also applied for foodstamps and Medicaid.  SW recommends patient contact mortgage company for deferment and SSA to discuss receipt of income.  Patient is also provided a food bank list via email.    Patient Goals/Self-Care Activities:  Patient will follow up with mortgage company and [employer] on benefits.  Plan:   Telephone follow up appointment with care management team member scheduled for:  07/27/24 at 2pm        Please call 911 if you are experiencing a Mental Health or Behavioral Health Crisis or need someone to talk to.  Patient verbalizes understanding of instructions and care plan provided today and agrees to view in MyChart. Active MyChart status and patient understanding of how to access instructions and care plan via MyChart confirmed with patient.     [name] Gardener, BSW [place]  [facility], [facility] Social Worker Direct Dial: [phone]  Fax: [phone] Website: [email or web address]

## 2024-07-20 ENCOUNTER — Other Ambulatory Visit: Payer: Self-pay

## 2024-07-20 ENCOUNTER — Encounter: Payer: Self-pay | Admitting: *Deleted
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
## 2024-07-20 NOTE — Patient Instructions (Addendum)
 Visit Information  Thank you for taking time to visit with me today. Please don't hesitate to contact me if I can be of assistance to you before our next scheduled appointment.  Your next care management appointment is by telephone on 07/25/24 at 2 pm  Patient Self-Care Activities:Attend all scheduled provider appointments Call provider office for new concerns or questions Take medications as prescribed  Purchase the antibiotic, take all of it + any other as needed medicines for congestion & sinus pressure. Follow contact precautions Follow up with the [employer], her deferment mortgage program, & the social security office for scheduled appointment    Please call the care guide team at [phone] if you need to cancel, schedule, or reschedule an appointment.   Please call the Suicide and Crisis Lifeline: 988 call the [place]  National Suicide Prevention Lifeline: (250)159-9422 or TTY: (201)720-8320 TTY 870-040-7700) to talk to a trained counselor call 1-800-273-TALK (toll free, 24 hour hotline) call the [facility]: (602) 119-0591 call 911 if you are experiencing a Mental Health or Behavioral Health Crisis or need someone to talk to.  Tyrece Vanterpool L. Ramonita, RN, BSN, CCM [place]  [facility], [facility] Health RN Care Manager Direct Dial: [phone]  Fax: [phone]

## 2024-07-25 ENCOUNTER — Telehealth: Admitting: *Deleted

## 2024-07-25 ENCOUNTER — Encounter: Payer: Self-pay | Admitting: *Deleted

## 2024-07-25 NOTE — Patient Instructions (Signed)
 Estela HERO Garciagarcia - I am sorry I was unable to reach you today for our 3 pm scheduled appointment. I work with Shona, Norleen PEDLAR, MD and am calling to support your healthcare needs. Please contact me at [phone] at your earliest convenience. I look forward to speaking with you soon.   Thank you,    Suzen L. Ramonita, RN, BSN, CCM [place]  [facility], [facility] Health RN Care Manager Direct Dial: [phone]  Fax: [phone]

## 2024-07-26 ENCOUNTER — Telehealth: Payer: Self-pay

## 2024-07-26 NOTE — Progress Notes (Unsigned)
 Complex Care Management Care Guide Note  07/26/2024 Name: [name] MRN: [id number] DOB: 1956/10/17  [name] is a 68 y.o. year old female who is a primary care patient of Shona, Norleen PEDLAR, MD and is actively engaged with the care management team. I reached out to Ahmira M Vanderberg by phone today to assist with re-scheduling  with the RN Case Manager.  Follow up plan: Unsuccessful telephone outreach attempt made. A HIPAA compliant phone message was left for the patient providing contact information and requesting a return call.  Leotis Rase [facility], [facility] Guide  Direct Dial: [phone]  Fax [phone]

## 2024-07-27 ENCOUNTER — Telehealth: Payer: Self-pay

## 2024-07-27 NOTE — Progress Notes (Signed)
 Complex Care Management Care Guide Note  07/27/2024 Name: [name] MRN: [id number] DOB: Mar 25, 1956  [name] is a 68 y.o. year old female who is a primary care patient of Shona, Norleen PEDLAR, MD and is actively engaged with the care management team. I reached out to Wana M Corro by phone today to assist with re-scheduling  with the BSW.  Follow up plan: Unsuccessful telephone outreach attempt made. A HIPAA compliant phone message was left for the patient providing contact information and requesting a return call.  Leotis Rase [facility], [facility] Guide  Direct Dial: [phone]  Fax [phone]

## 2024-08-01 ENCOUNTER — Telehealth: Payer: Self-pay

## 2024-09-03 ENCOUNTER — Other Ambulatory Visit: Payer: Self-pay | Admitting: Cardiology

## 2024-11-15 ENCOUNTER — Ambulatory Visit: Admitting: Emergency Medicine

## 2024-11-15 NOTE — Progress Notes (Deleted)
" °  Cardiology Office Note:    Date:  11/15/2024  ID:  [name], DOB 11/07/1956, MRN [id number] PCP: Shona Norleen PEDLAR, MD  [facility] Providers Cardiologist:  Lynwood Schilling, MD Cardiology APP:  Miriam Norris, NP { Click to update primary MD,subspecialty MD or APP then REFRESH:1}    {Click to Open Review  :1}   Patient Profile:       Chief Complaint: *** History of Present Illness:  [name] is a 69 y.o. female with visit-pertinent history of coronary artery disease, prior history of DVT of the left lower extremity, hypertension, GERD, depression/anxiety, chronic back pain, carotid artery disease  Underwent low risk stress test in July 2021.  Echocardiogram in July 2021 showed LVEF 60 to 65%, no RWMA, mild LVH, grade 1 DD, RV function and size normal, normal PASP, mild to moderate mitral valve regurgitation, mild to moderate aortic valve sclerosis without evidence of aortic stenosis.  Seen 02/03/2023 and noted to have chest pressure and palpitations.  Underwent stress testing on 02/2023 which was negative for ischemia and infarction.  Cardiac monitor showed predominantly normal sinus rhythm, 1 run of NSVT, occasional runs of SVT with longest lasting 8 beats and no sustained arrhythmias.  Carotid Dopplers 02/2023 showed right ICA consistent with 1-39% stenosis in left carotid with near normal minimal wall thickening or plaque.  Echocardiogram 02/2023 showed LVEF 65 to 70%, no RWMA, mild LVH, RV function size normal, left atrial size moderately dilated, mild mitral valve regurgitation, severe MAC, mild to moderate tricuspid valve regurgitation, mild aortic valve stenosis  Was last seen in clinic on 08/20/2023 by Norris, NP.  Patient was unsure about her medications she was supposed to be taking.  Not currently on antihypertensive.  Blood pressure was elevated at 159/77.  Patient was started on olmesartan  20 mg daily and rosuvastatin  10 mg daily.  Discussed the use of AI scribe software  for clinical note transcription with the patient, who gave verbal consent to proceed.  History of Present Illness     Review of systems:  Please see the history of present illness. All other systems are reviewed and otherwise negative. ***      Studies Reviewed:        ***  Risk Assessment/Calculations:   {Does this patient have ATRIAL FIBRILLATION?:[id number]} No BP recorded.  {Refresh Note OR Click here to enter BP  :1}***        Physical Exam:   VS:  There were no vitals taken for this visit.   Wt Readings from Last 3 Encounters:  09/02/23 189 lb (85.7 kg)  08/20/23 189 lb (85.7 kg)  02/03/23 206 lb (93.4 kg)    GEN: Well nourished, well developed in no acute distress NECK: No JVD; No carotid bruits CARDIAC: ***RRR, no murmurs, rubs, gallops RESPIRATORY:  Clear to auscultation without rales, wheezing or rhonchi  ABDOMEN: Soft, non-tender, non-distended EXTREMITIES:  No edema; No acute deformity ***      Assessment and Plan:    Assessment and Plan Assessment & Plan      {Are you ordering a CV Procedure (e.g. stress test, cath, DCCV, TEE, etc)?   Press F2        :[id number]}  Dispo:  No follow-ups on file.  Signed, Jasmine LITTIE Louis, NP  "
